# Patient Record
Sex: Female | Born: 1945
Health system: Southern US, Community
[De-identification: ages and names within clinical notes are randomized; demographics above are authoritative.]

## PROBLEM LIST (undated history)

## (undated) DIAGNOSIS — K219 Gastro-esophageal reflux disease without esophagitis: Secondary | ICD-10-CM

## (undated) DIAGNOSIS — K449 Diaphragmatic hernia without obstruction or gangrene: Secondary | ICD-10-CM

## (undated) DIAGNOSIS — I10 Essential (primary) hypertension: Secondary | ICD-10-CM

## (undated) DIAGNOSIS — K5790 Diverticulosis of intestine, part unspecified, without perforation or abscess without bleeding: Secondary | ICD-10-CM

## (undated) DIAGNOSIS — K8020 Calculus of gallbladder without cholecystitis without obstruction: Secondary | ICD-10-CM

## (undated) DIAGNOSIS — J449 Chronic obstructive pulmonary disease, unspecified: Secondary | ICD-10-CM

## (undated) DIAGNOSIS — F419 Anxiety disorder, unspecified: Secondary | ICD-10-CM

## (undated) DIAGNOSIS — R918 Other nonspecific abnormal finding of lung field: Secondary | ICD-10-CM

## (undated) DIAGNOSIS — K559 Vascular disorder of intestine, unspecified: Secondary | ICD-10-CM

## (undated) DIAGNOSIS — N200 Calculus of kidney: Secondary | ICD-10-CM

## (undated) DIAGNOSIS — M549 Dorsalgia, unspecified: Secondary | ICD-10-CM

## (undated) DIAGNOSIS — K922 Gastrointestinal hemorrhage, unspecified: Secondary | ICD-10-CM

## (undated) DIAGNOSIS — I2510 Atherosclerotic heart disease of native coronary artery without angina pectoris: Secondary | ICD-10-CM

## (undated) DIAGNOSIS — G8929 Other chronic pain: Secondary | ICD-10-CM

## (undated) HISTORY — DX: Calculus of kidney: N20.0

## (undated) HISTORY — DX: Atherosclerotic heart disease of native coronary artery without angina pectoris: I25.10

## (undated) HISTORY — PX: ABDOMINAL HYSTERECTOMY: SHX81

## (undated) HISTORY — DX: Calculus of gallbladder without cholecystitis without obstruction: K80.20

## (undated) HISTORY — PX: BACK SURGERY: SHX140

## (undated) HISTORY — PX: SPINAL FUSION: SHX223

## (undated) HISTORY — DX: Anxiety disorder, unspecified: F41.9

## (undated) HISTORY — PX: SMALL INTESTINE SURGERY: SHX150

## (undated) HISTORY — DX: Essential (primary) hypertension: I10

## (undated) HISTORY — DX: Gastro-esophageal reflux disease without esophagitis: K21.9

## (undated) HISTORY — PX: SALPINGOOPHORECTOMY: SHX82

---

## 1993-05-27 HISTORY — PX: CORONARY STENT PLACEMENT: SHX1402

## 1996-05-27 HISTORY — PX: CORONARY ARTERY BYPASS GRAFT: SHX141

## 1998-03-02 ENCOUNTER — Encounter: Payer: Self-pay | Admitting: *Deleted

## 1998-03-02 ENCOUNTER — Ambulatory Visit (HOSPITAL_COMMUNITY): Admission: RE | Admit: 1998-03-02 | Discharge: 1998-03-02 | Payer: Self-pay

## 2002-06-09 ENCOUNTER — Ambulatory Visit (HOSPITAL_COMMUNITY): Admission: RE | Admit: 2002-06-09 | Discharge: 2002-06-09 | Payer: Self-pay | Admitting: Internal Medicine

## 2002-06-09 ENCOUNTER — Encounter: Payer: Self-pay | Admitting: Internal Medicine

## 2003-06-24 ENCOUNTER — Ambulatory Visit (HOSPITAL_COMMUNITY): Admission: RE | Admit: 2003-06-24 | Discharge: 2003-06-24 | Payer: Self-pay | Admitting: Internal Medicine

## 2004-07-17 ENCOUNTER — Ambulatory Visit (HOSPITAL_COMMUNITY): Admission: RE | Admit: 2004-07-17 | Discharge: 2004-07-17 | Payer: Self-pay | Admitting: Internal Medicine

## 2005-07-30 ENCOUNTER — Ambulatory Visit (HOSPITAL_COMMUNITY): Admission: RE | Admit: 2005-07-30 | Discharge: 2005-07-30 | Payer: Self-pay | Admitting: Internal Medicine

## 2005-07-30 ENCOUNTER — Emergency Department (HOSPITAL_COMMUNITY): Admission: EM | Admit: 2005-07-30 | Discharge: 2005-07-30 | Payer: Self-pay | Admitting: Emergency Medicine

## 2005-08-27 ENCOUNTER — Ambulatory Visit (HOSPITAL_COMMUNITY): Admission: RE | Admit: 2005-08-27 | Discharge: 2005-08-27 | Payer: Self-pay | Admitting: Internal Medicine

## 2005-08-30 ENCOUNTER — Ambulatory Visit (HOSPITAL_COMMUNITY): Admission: RE | Admit: 2005-08-30 | Discharge: 2005-08-30 | Payer: Self-pay | Admitting: Internal Medicine

## 2006-08-08 ENCOUNTER — Ambulatory Visit (HOSPITAL_COMMUNITY): Admission: RE | Admit: 2006-08-08 | Discharge: 2006-08-08 | Payer: Self-pay | Admitting: Internal Medicine

## 2009-05-31 ENCOUNTER — Inpatient Hospital Stay (HOSPITAL_COMMUNITY): Admission: EM | Admit: 2009-05-31 | Discharge: 2009-06-08 | Payer: Self-pay | Admitting: Emergency Medicine

## 2009-09-18 ENCOUNTER — Ambulatory Visit (HOSPITAL_COMMUNITY): Admission: RE | Admit: 2009-09-18 | Discharge: 2009-09-18 | Payer: Self-pay | Admitting: Internal Medicine

## 2010-06-17 ENCOUNTER — Encounter: Payer: Self-pay | Admitting: Internal Medicine

## 2010-08-12 LAB — BLOOD GAS, ARTERIAL
Bicarbonate: 22.5 mEq/L (ref 20.0–24.0)
O2 Saturation: 95.9 %
pCO2 arterial: 35.6 mmHg (ref 35.0–45.0)
pH, Arterial: 7.418 — ABNORMAL HIGH (ref 7.350–7.400)

## 2010-08-12 LAB — DIFFERENTIAL
Basophils Absolute: 0 10*3/uL (ref 0.0–0.1)
Basophils Absolute: 0 10*3/uL (ref 0.0–0.1)
Basophils Absolute: 0 10*3/uL (ref 0.0–0.1)
Basophils Relative: 0 % (ref 0–1)
Basophils Relative: 0 % (ref 0–1)
Basophils Relative: 1 % (ref 0–1)
Eosinophils Absolute: 0 10*3/uL (ref 0.0–0.7)
Eosinophils Absolute: 0 10*3/uL (ref 0.0–0.7)
Eosinophils Absolute: 0.1 10*3/uL (ref 0.0–0.7)
Eosinophils Relative: 0 % (ref 0–5)
Eosinophils Relative: 0 % (ref 0–5)
Eosinophils Relative: 2 % (ref 0–5)
Lymphocytes Relative: 36 % (ref 12–46)
Lymphocytes Relative: 40 % (ref 12–46)
Lymphocytes Relative: 7 % — ABNORMAL LOW (ref 12–46)
Lymphs Abs: 1.4 10*3/uL (ref 0.7–4.0)
Lymphs Abs: 2.3 10*3/uL (ref 0.7–4.0)
Monocytes Absolute: 0.5 10*3/uL (ref 0.1–1.0)
Monocytes Absolute: 0.7 10*3/uL (ref 0.1–1.0)
Monocytes Relative: 4 % (ref 3–12)
Monocytes Relative: 6 % (ref 3–12)
Monocytes Relative: 9 % (ref 3–12)
Neutro Abs: 17.8 10*3/uL — ABNORMAL HIGH (ref 1.7–7.7)
Neutro Abs: 2.7 10*3/uL (ref 1.7–7.7)
Neutro Abs: 5.5 10*3/uL (ref 1.7–7.7)
Neutrophils Relative %: 48 % (ref 43–77)
Neutrophils Relative %: 57 % (ref 43–77)
Neutrophils Relative %: 90 % — ABNORMAL HIGH (ref 43–77)

## 2010-08-12 LAB — BASIC METABOLIC PANEL WITH GFR
BUN: 12 mg/dL (ref 6–23)
CO2: 30 meq/L (ref 19–32)
Calcium: 9.2 mg/dL (ref 8.4–10.5)
Chloride: 104 meq/L (ref 96–112)
Creatinine, Ser: 0.84 mg/dL (ref 0.4–1.2)
GFR calc non Af Amer: >60 mL/min
Glucose, Bld: 95 mg/dL (ref 70–99)
Potassium: 4.1 meq/L (ref 3.5–5.1)
Sodium: 141 meq/L (ref 135–145)

## 2010-08-12 LAB — BASIC METABOLIC PANEL
BUN: 19 mg/dL (ref 6–23)
CO2: 29 mEq/L (ref 19–32)
Calcium: 9.1 mg/dL (ref 8.4–10.5)
Chloride: 101 mEq/L (ref 96–112)
Chloride: 101 mEq/L (ref 96–112)
Creatinine, Ser: 0.82 mg/dL (ref 0.4–1.2)
GFR calc Af Amer: >60 mL/min (ref >60)
GFR calc Af Amer: >60 mL/min (ref >60)
GFR calc non Af Amer: >60 mL/min (ref >60)
Glucose, Bld: 172 mg/dL — ABNORMAL HIGH (ref 70–99)
Glucose, Bld: 94 mg/dL (ref 70–99)
Potassium: 4 mEq/L (ref 3.5–5.1)
Potassium: 4.2 mEq/L (ref 3.5–5.1)
Sodium: 139 mEq/L (ref 135–145)

## 2010-08-12 LAB — HEPATIC FUNCTION PANEL
ALT: 24 U/L (ref 0–35)
Albumin: 3.5 g/dL (ref 3.5–5.2)
Bilirubin, Direct: 0.1 mg/dL (ref 0.0–0.3)

## 2010-08-12 LAB — CBC
HCT: 41.2 % (ref 36.0–46.0)
HCT: 46.5 % — ABNORMAL HIGH (ref 36.0–46.0)
Hemoglobin: 14 g/dL (ref 12.0–15.0)
Hemoglobin: 15.9 g/dL — ABNORMAL HIGH (ref 12.0–15.0)
MCHC: 34 g/dL (ref 30.0–36.0)
MCV: 85.6 fL (ref 78.0–100.0)
Platelets: 249 10*3/uL (ref 150–400)
Platelets: 344 10*3/uL (ref 150–400)
RBC: 4.85 MIL/uL (ref 3.87–5.11)
RBC: 5.44 MIL/uL — ABNORMAL HIGH (ref 3.87–5.11)
RDW: 15.2 % (ref 11.5–15.5)
RDW: 15.6 % — ABNORMAL HIGH (ref 11.5–15.5)
WBC: 5.6 10*3/uL (ref 4.0–10.5)

## 2010-08-12 LAB — LIPID PANEL
HDL: 31 mg/dL — ABNORMAL LOW
Total CHOL/HDL Ratio: 6.1 ratio
Triglycerides: 221 mg/dL — ABNORMAL HIGH
VLDL: 44 mg/dL — ABNORMAL HIGH (ref 0–40)

## 2010-08-24 ENCOUNTER — Other Ambulatory Visit (HOSPITAL_COMMUNITY): Payer: Self-pay | Admitting: Internal Medicine

## 2010-08-24 DIAGNOSIS — Z139 Encounter for screening, unspecified: Secondary | ICD-10-CM

## 2010-09-17 ENCOUNTER — Other Ambulatory Visit (HOSPITAL_COMMUNITY): Payer: Self-pay | Admitting: Internal Medicine

## 2010-09-17 DIAGNOSIS — Z139 Encounter for screening, unspecified: Secondary | ICD-10-CM

## 2010-09-19 ENCOUNTER — Encounter (HOSPITAL_COMMUNITY): Payer: Self-pay

## 2010-09-19 ENCOUNTER — Ambulatory Visit (HOSPITAL_COMMUNITY)
Admission: RE | Admit: 2010-09-19 | Discharge: 2010-09-19 | Disposition: A | Discharge status: Home / Self Care | Payer: PRIVATE HEALTH INSURANCE | Source: Ambulatory Visit | Attending: Internal Medicine | Admitting: Internal Medicine

## 2010-09-19 DIAGNOSIS — Z139 Encounter for screening, unspecified: Secondary | ICD-10-CM

## 2010-09-19 DIAGNOSIS — Z13820 Encounter for screening for osteoporosis: Secondary | ICD-10-CM | POA: Insufficient documentation

## 2010-09-19 DIAGNOSIS — Z780 Asymptomatic menopausal state: Secondary | ICD-10-CM | POA: Insufficient documentation

## 2010-09-21 ENCOUNTER — Ambulatory Visit (HOSPITAL_COMMUNITY)
Admission: RE | Admit: 2010-09-21 | Discharge: 2010-09-21 | Disposition: A | Discharge status: Home / Self Care | Payer: PRIVATE HEALTH INSURANCE | Source: Ambulatory Visit | Attending: Internal Medicine | Admitting: Internal Medicine

## 2010-09-21 DIAGNOSIS — Z1231 Encounter for screening mammogram for malignant neoplasm of breast: Secondary | ICD-10-CM | POA: Insufficient documentation

## 2010-09-21 DIAGNOSIS — Z139 Encounter for screening, unspecified: Secondary | ICD-10-CM

## 2011-03-28 DIAGNOSIS — R918 Other nonspecific abnormal finding of lung field: Secondary | ICD-10-CM

## 2011-03-28 DIAGNOSIS — K559 Vascular disorder of intestine, unspecified: Secondary | ICD-10-CM

## 2011-03-28 DIAGNOSIS — K922 Gastrointestinal hemorrhage, unspecified: Secondary | ICD-10-CM

## 2011-03-28 HISTORY — DX: Gastrointestinal hemorrhage, unspecified: K92.2

## 2011-03-28 HISTORY — DX: Other nonspecific abnormal finding of lung field: R91.8

## 2011-03-28 HISTORY — DX: Vascular disorder of intestine, unspecified: K55.9

## 2011-04-08 ENCOUNTER — Emergency Department (HOSPITAL_COMMUNITY): Payer: Medicare (Managed Care)

## 2011-04-08 ENCOUNTER — Inpatient Hospital Stay (HOSPITAL_COMMUNITY)
Admission: EM | Admit: 2011-04-08 | Discharge: 2011-04-17 | DRG: 394 | Disposition: A | Discharge status: Home / Self Care | Payer: Medicare (Managed Care) | Attending: Internal Medicine | Admitting: Internal Medicine

## 2011-04-08 ENCOUNTER — Encounter (HOSPITAL_COMMUNITY): Payer: Self-pay | Admitting: Emergency Medicine

## 2011-04-08 DIAGNOSIS — K648 Other hemorrhoids: Secondary | ICD-10-CM | POA: Diagnosis present

## 2011-04-08 DIAGNOSIS — K8020 Calculus of gallbladder without cholecystitis without obstruction: Secondary | ICD-10-CM | POA: Diagnosis present

## 2011-04-08 DIAGNOSIS — K529 Noninfective gastroenteritis and colitis, unspecified: Secondary | ICD-10-CM

## 2011-04-08 DIAGNOSIS — E785 Hyperlipidemia, unspecified: Secondary | ICD-10-CM | POA: Diagnosis present

## 2011-04-08 DIAGNOSIS — I10 Essential (primary) hypertension: Secondary | ICD-10-CM | POA: Diagnosis present

## 2011-04-08 DIAGNOSIS — K559 Vascular disorder of intestine, unspecified: Principal | ICD-10-CM | POA: Diagnosis present

## 2011-04-08 DIAGNOSIS — Z951 Presence of aortocoronary bypass graft: Secondary | ICD-10-CM

## 2011-04-08 DIAGNOSIS — I2510 Atherosclerotic heart disease of native coronary artery without angina pectoris: Secondary | ICD-10-CM | POA: Diagnosis present

## 2011-04-08 DIAGNOSIS — K449 Diaphragmatic hernia without obstruction or gangrene: Secondary | ICD-10-CM | POA: Diagnosis present

## 2011-04-08 DIAGNOSIS — A09 Infectious gastroenteritis and colitis, unspecified: Secondary | ICD-10-CM | POA: Diagnosis present

## 2011-04-08 DIAGNOSIS — R911 Solitary pulmonary nodule: Secondary | ICD-10-CM | POA: Diagnosis present

## 2011-04-08 DIAGNOSIS — K5730 Diverticulosis of large intestine without perforation or abscess without bleeding: Secondary | ICD-10-CM | POA: Diagnosis present

## 2011-04-08 HISTORY — DX: Vascular disorder of intestine, unspecified: K55.9

## 2011-04-08 HISTORY — DX: Gastrointestinal hemorrhage, unspecified: K92.2

## 2011-04-08 LAB — COMPREHENSIVE METABOLIC PANEL
Alkaline Phosphatase: 76 U/L (ref 39–117)
BUN: 15 mg/dL (ref 6–23)
CO2: 29 mEq/L (ref 19–32)
Chloride: 102 mEq/L (ref 96–112)
Creatinine, Ser: 0.92 mg/dL (ref 0.50–1.10)
GFR calc non Af Amer: 63 mL/min — ABNORMAL LOW (ref >90)
Total Bilirubin: 0.4 mg/dL (ref 0.3–1.2)

## 2011-04-08 LAB — DIFFERENTIAL
Lymphocytes Relative: 11 % — ABNORMAL LOW (ref 12–46)
Lymphs Abs: 2.3 10*3/uL (ref 0.7–4.0)
Monocytes Absolute: 1.4 10*3/uL — ABNORMAL HIGH (ref 0.1–1.0)
Monocytes Relative: 7 % (ref 3–12)
Neutro Abs: 16.3 10*3/uL — ABNORMAL HIGH (ref 1.7–7.7)

## 2011-04-08 LAB — CBC
HCT: 51 % — ABNORMAL HIGH (ref 36.0–46.0)
Hemoglobin: 17.1 g/dL — ABNORMAL HIGH (ref 12.0–15.0)
MCHC: 33.5 g/dL (ref 30.0–36.0)
RBC: 5.96 MIL/uL — ABNORMAL HIGH (ref 3.87–5.11)
WBC: 20 10*3/uL — ABNORMAL HIGH (ref 4.0–10.5)

## 2011-04-08 MED ORDER — ONDANSETRON HCL 4 MG PO TABS: 4.0000 mg | ORAL_TABLET | Freq: Four times a day (QID) | ORAL | Status: DC | PRN

## 2011-04-08 MED ORDER — CIPROFLOXACIN IN D5W 400 MG/200ML IV SOLN
400.0000 mg | Freq: Once | INTRAVENOUS | Status: AC
  Administered 2011-04-08: 400 mg via INTRAVENOUS
  Filled 2011-04-08: qty 200

## 2011-04-08 MED ORDER — ACETAMINOPHEN 650 MG RE SUPP: 650.0000 mg | Freq: Four times a day (QID) | RECTAL | Status: DC | PRN

## 2011-04-08 MED ORDER — HYDROCORTISONE SOD SUCCINATE 100 MG IJ SOLR
100.0000 mg | Freq: Once | INTRAMUSCULAR | Status: DC
Start: 2011-04-08 — End: 2011-04-08
  Filled 2011-04-08: qty 2

## 2011-04-08 MED ORDER — SIMVASTATIN 20 MG PO TABS
20.0000 mg | ORAL_TABLET | Freq: Every day | ORAL | Status: DC
  Administered 2011-04-08 – 2011-04-16 (×9): 20 mg via ORAL
  Filled 2011-04-08 (×11): qty 1

## 2011-04-08 MED ORDER — SODIUM CHLORIDE 0.9 % IV SOLN
INTRAVENOUS | Status: DC
  Administered 2011-04-09 (×2): via INTRAVENOUS
  Administered 2011-04-10 – 2011-04-11 (×3): 1000 mL via INTRAVENOUS
  Administered 2011-04-12 – 2011-04-16 (×5): via INTRAVENOUS
  Administered 2011-04-16: 1000 mL via INTRAVENOUS

## 2011-04-08 MED ORDER — HYDROCORTISONE SOD SUCCINATE 250 MG IJ SOLR
200.0000 mg | Freq: Once | INTRAMUSCULAR | Status: AC
  Administered 2011-04-08: 200 mg via INTRAVENOUS
  Filled 2011-04-08: qty 200

## 2011-04-08 MED ORDER — ALUM & MAG HYDROXIDE-SIMETH 200-200-20 MG/5ML PO SUSP: 30.0000 mL | Freq: Four times a day (QID) | ORAL | Status: DC | PRN

## 2011-04-08 MED ORDER — ONDANSETRON HCL 4 MG/2ML IJ SOLN
4.0000 mg | Freq: Four times a day (QID) | INTRAMUSCULAR | Status: DC | PRN
  Administered 2011-04-10: 4 mg via INTRAVENOUS
  Filled 2011-04-08: qty 2

## 2011-04-08 MED ORDER — ONDANSETRON HCL 4 MG/2ML IJ SOLN
4.0000 mg | Freq: Once | INTRAMUSCULAR | Status: AC
  Administered 2011-04-08: 4 mg via INTRAVENOUS
  Filled 2011-04-08: qty 2

## 2011-04-08 MED ORDER — DIPHENHYDRAMINE HCL 50 MG/ML IJ SOLN
50.0000 mg | Freq: Once | INTRAMUSCULAR | Status: AC
  Administered 2011-04-08: 50 mg via INTRAVENOUS
  Filled 2011-04-08 (×2): qty 1

## 2011-04-08 MED ORDER — PANTOPRAZOLE SODIUM 40 MG IV SOLR
40.0000 mg | INTRAVENOUS | Status: DC
  Administered 2011-04-08 – 2011-04-11 (×4): 40 mg via INTRAVENOUS
  Filled 2011-04-08 (×4): qty 40

## 2011-04-08 MED ORDER — HYDROMORPHONE HCL PF 1 MG/ML IJ SOLN
0.5000 mg | INTRAMUSCULAR | Status: DC | PRN
  Administered 2011-04-08 – 2011-04-11 (×7): 0.5 mg via INTRAVENOUS
  Filled 2011-04-08 (×7): qty 1

## 2011-04-08 MED ORDER — METOPROLOL TARTRATE 50 MG PO TABS
ORAL_TABLET | ORAL | Status: AC
  Administered 2011-04-08: 20:00:00
  Filled 2011-04-08: qty 1

## 2011-04-08 MED ORDER — METOPROLOL SUCCINATE ER 50 MG PO TB24
25.0000 mg | ORAL_TABLET | Freq: Every day | ORAL | Status: DC
  Administered 2011-04-08 – 2011-04-13 (×6): 25 mg via ORAL
  Filled 2011-04-08: qty 1
  Filled 2011-04-08 (×2): qty 0.5
  Filled 2011-04-08: qty 1
  Filled 2011-04-08: qty 0.5
  Filled 2011-04-08 (×3): qty 1

## 2011-04-08 MED ORDER — SODIUM CHLORIDE 0.9 % IV SOLN
Freq: Once | INTRAVENOUS | Status: AC
  Administered 2011-04-08: 16:00:00 via INTRAVENOUS

## 2011-04-08 MED ORDER — IOHEXOL 300 MG/ML  SOLN
100.0000 mL | Freq: Once | INTRAMUSCULAR | Status: AC | PRN
  Administered 2011-04-08: 100 mL via INTRAVENOUS

## 2011-04-08 MED ORDER — HYDROMORPHONE HCL PF 1 MG/ML IJ SOLN
1.0000 mg | Freq: Once | INTRAMUSCULAR | Status: AC
  Administered 2011-04-08: 1 mg via INTRAVENOUS
  Filled 2011-04-08: qty 1

## 2011-04-08 MED ORDER — ACETAMINOPHEN 325 MG PO TABS
650.0000 mg | ORAL_TABLET | Freq: Four times a day (QID) | ORAL | Status: DC | PRN
  Administered 2011-04-12: 650 mg via ORAL
  Filled 2011-04-08: qty 2

## 2011-04-08 NOTE — ED Notes (Signed)
Patient report given to this nurse. Assuming care of patient.  

## 2011-04-08 NOTE — ED Provider Notes (Signed)
History   This chart was scribed for Valerie Lennert, MD by Clarita Crane. The patient was seen in room APA04/[facility]04 and the patient's care was started at 3:12PM.   CSN: [id number] Arrival date & time: 04/08/2011  2:02 PM   First MD Initiated Contact with Patient 04/08/11 1509      Chief Complaint  Patient presents with  . Nausea  . Emesis  . Rectal Bleeding   HPI [name] is a 65 y.o. female who presents to the Emergency Department complaining of constant moderate to severe diarrhea with associated rectal bleeding, nausea, vomiting and lower abdominal pain described as burning onset last night and persistent since. Patient states rectal bleeding began following several episodes of diarrhea and has been mostly bright red blood from rectum with a small amount of stool. Denies hematemesis, fever, SOB. States she had experienced similar symptoms approximately 1 month ago which resolved on its own prior to being evaluated by PCP[facility]. Patient with h/o partial hysterectomy and hypertension.  PCP-[facility]  Past Medical History  Diagnosis Date  . Hypertension     History reviewed. No pertinent past surgical history.  History reviewed. No pertinent family history.  History  Substance Use Topics  . Smoking status: Not on file  . Smokeless tobacco: Not on file  . Alcohol Use: Yes    OB History    Grav Para Term Preterm Abortions TAB SAB Ect Mult Living                  Review of Systems  Constitutional: Negative for fever.  HENT: Negative for congestion and rhinorrhea.   Eyes: Negative for pain.  Respiratory: Negative for cough and shortness of breath.   Cardiovascular: Negative for chest pain.  Gastrointestinal: Positive for nausea, vomiting, abdominal pain, diarrhea and anal bleeding.  Genitourinary: Negative for dysuria.  Musculoskeletal: Negative for back pain.  Skin: Negative for rash.  Neurological: Negative for weakness and headaches.     Allergies  Omnipaque and  Penicillins  Home Medications   Current Outpatient Rx  Name Route Sig Dispense Refill  . ASPIRIN EC 81 MG PO TBEC Oral Take 81 mg by mouth daily.      Marland Kitchen VITAMIN D 1000 UNITS PO TABS Oral Take 1,000 Units by mouth daily.      Marland Kitchen LISINOPRIL 10 MG PO TABS Oral Take 10 mg by mouth daily.      Marland Kitchen METOPROLOL SUCCINATE 25 MG PO TB24 Oral Take 25 mg by mouth daily.      Marland Kitchen SIMVASTATIN 20 MG PO TABS Oral Take 20 mg by mouth at bedtime.        BP 129/104  Pulse 64  Temp(Src) 98.3 F (36.8 C) (Oral)  Resp 20  Ht 5\' 3"  (1.6 m)  Wt 200 lb (90.719 kg)  BMI 35.43 kg/m2  SpO2 96%  Physical Exam  Nursing note and vitals reviewed. Constitutional: She is oriented to person, place, and time. She appears well-developed and well-nourished. No distress.  HENT:  Head: Normocephalic and atraumatic.  Eyes: EOM are normal. Pupils are equal, round, and reactive to light. No scleral icterus.  Neck: Neck supple. No thyromegaly present.  Cardiovascular: Normal rate and regular rhythm.  Exam reveals no gallop and no friction rub.   No murmur heard. Pulmonary/Chest: Effort normal. No stridor. No respiratory distress. She has no wheezes. She has no rales. She exhibits no tenderness.  Abdominal: Soft. She exhibits no distension. There is tenderness (moderate) in the right  lower quadrant, suprapubic area and left lower quadrant. There is no rebound and no guarding.  Musculoskeletal: Normal range of motion. She exhibits no edema.  Neurological: She is alert and oriented to person, place, and time.  Skin: Skin is warm and dry.  Psychiatric: She has a normal mood and affect. Her behavior is normal.    ED Course  Procedures  DIAGNOSTIC STUDIES: Oxygen Saturation is 98% on room air, normal by my interpretation.    COORDINATION OF CARE: 5:24PM- Patient with bright red blood observed in commode after BM. 6:20PM- Patient informed of current lab and imaging results and intent to admit to hospital. Patient agrees  with current plan set forth. 6:32PM- Consult complete with Dr. Ouida Sills. Patient case explained and discussed. Dr. Ouida Sills agrees to admit patient for further evaluation and treatment.  Labs Reviewed  CBC - Abnormal; Notable for the following:    WBC 20.0 (*)    RBC 5.96 (*)    Hemoglobin 17.1 (*)    HCT 51.0 (*)    Platelets 416 (*)    All other components within normal limits  DIFFERENTIAL - Abnormal; Notable for the following:    Neutrophils Relative 82 (*)    Neutro Abs 16.3 (*)    Lymphocytes Relative 11 (*)    Monocytes Absolute 1.4 (*)    All other components within normal limits  COMPREHENSIVE METABOLIC PANEL - Abnormal; Notable for the following:    Glucose, Bld 127 (*)    Total Protein 8.4 (*)    GFR calc non Af Amer 63 (*)    GFR calc Af Amer 73 (*)    All other components within normal limits   Ct Abdomen Pelvis W Contrast  04/08/2011  *RADIOLOGY REPORT*  Clinical Data: Rectal bleeding.  Nausea and vomiting.  Previous partial hysterectomy.  CT ABDOMEN AND PELVIS WITH CONTRAST  Technique:  Multidetector CT imaging of the abdomen and pelvis was performed following the standard protocol during bolus administration of intravenous contrast.  Contrast: OMNIPAQUE IOHEXOL 300 MG/ML IV SOLN  Comparison: None.  Findings: Moderate sized hiatal hernia.  Diffuse low density wall thickening involving the distal transverse colon, splenic flexure, descending colon and proximal sigmoid colon.  Associated pericolonic soft tissue stranding.  Multiple sigmoid colon diverticula.  Normal appearing appendix.  No enlarged lymph nodes.  1.5 cm noncalcified gallstone in the gallbladder without gallbladder wall thickening or pericholecystic fluid.  Unremarkable liver, spleen, pancreas, adrenal glands, kidneys and urinary bladder.  Small uterus containing calcified and noncalcified masses.  Unremarkable right ovary.  The left ovary is not visualized.  4 mm subpleural nodule in the lateral aspect of the  right middle lobe on image number 30.  Atheromatous arterial calcifications. Normal opacification of the superior mesenteric artery and vein and their branches.  Lumbar and lower thoracic spine degenerative changes.  IMPRESSION:  1.  Transverse, descending and proximal sigmoid colon colitis. This is most likely infectious or inflammatory in nature.  Ischemic colitis is less likely. 2.  Sigmoid diverticulosis. 3.  Cholelithiasis without evidence of cholecystitis. 4.  Moderate-sized hiatal hernia. 5.  Small uterus containing fibroids. 6.  4 mm subpleural right middle lobe noncalcified nodule.  An elective chest CT with contrast would be useful for establishing a baseline for follow up.  Original Report Authenticated By: Darrol Angel, M.D.     1. Colitis       MDM        The chart was scribed for me under my  direct supervision.  I personally performed the history, physical, and medical decision making and all procedures in the evaluation of this patient.Valerie Lennert, MD 04/08/11 [phone]

## 2011-04-08 NOTE — ED Notes (Signed)
Pt c/o n/v/d with rectal bleed since last night. Pt also c/o headache.

## 2011-04-08 NOTE — ED Notes (Signed)
Small stool specimen obtained. Bloody. Placed in specimen cup to be sent to lab.

## 2011-04-08 NOTE — ED Notes (Signed)
C/o n/v/d and lower abd cramping onset last night.

## 2011-04-08 NOTE — ED Notes (Signed)
Dr. Ouida Sills paged for Dr. Estell Harpin.

## 2011-04-08 NOTE — ED Notes (Signed)
Given ginger ale per request. Denies any needs. Pain 3/10 at this time. Call bell within reach. Family at bedside. In no distress. Will continue to monitor.

## 2011-04-08 NOTE — ED Notes (Signed)
Report given to [place] hurd, rn. Ready for patient transfer.

## 2011-04-08 NOTE — ED Notes (Signed)
ATTEMPTED TO CALL REPORT. WILL CALL BACK FOR REPORT.

## 2011-04-08 NOTE — ED Notes (Signed)
Pt also reports passing blood in stool; reports diarrhea stools with no blood initially, but states, "now it's nothing but blood".  Also c/o right anterior rib pain onset after vomiting last night.

## 2011-04-08 NOTE — ED Notes (Signed)
Patient resting sitting up in bed. Denies any needs at this time. Call bell within reach. Family at bedside. Notified awaiting bed assignment. Call bell within reach. Will continue to monitor.

## 2011-04-08 NOTE — ED Notes (Signed)
Into room to see patient. Resting laying on back in bed. cipro infusion complete. Normal saline continues to infuse well. States she needs to pass bowel movement. Tech into room to place patient on bedpan. Denies any needs at this time. Call bell within reach. Family with patient.

## 2011-04-09 ENCOUNTER — Encounter (HOSPITAL_COMMUNITY): Payer: Self-pay | Admitting: Urgent Care

## 2011-04-09 DIAGNOSIS — K5289 Other specified noninfective gastroenteritis and colitis: Secondary | ICD-10-CM

## 2011-04-09 DIAGNOSIS — K8050 Calculus of bile duct without cholangitis or cholecystitis without obstruction: Secondary | ICD-10-CM

## 2011-04-09 LAB — CBC
MCH: 28.5 pg (ref 26.0–34.0)
Platelets: 316 10*3/uL (ref 150–400)
RBC: 4.85 MIL/uL (ref 3.87–5.11)
RDW: 15.2 % (ref 11.5–15.5)
WBC: 16.1 10*3/uL — ABNORMAL HIGH (ref 4.0–10.5)

## 2011-04-09 MED ORDER — CIPROFLOXACIN IN D5W 400 MG/200ML IV SOLN
400.0000 mg | Freq: Two times a day (BID) | INTRAVENOUS | Status: AC
  Administered 2011-04-09 – 2011-04-11 (×6): 400 mg via INTRAVENOUS
  Filled 2011-04-09 (×7): qty 200

## 2011-04-09 NOTE — Progress Notes (Signed)
NAMEKENNA, [name]                   ACCOUNT NO.:  [id number]  MEDICAL RECORD NO.:  [id number]  LOCATION:  A334                          FACILITY:  [facility]  PHYSICIAN:  Gildardo Tickner D. Felecia Shelling, MD   DATE OF BIRTH:  07-24-1945  DATE OF PROCEDURE:  04/09/2011 DATE OF DISCHARGE:                                PROGRESS NOTE   SUBJECTIVE:  The patient continued to complain of lower abdominal pain and rectal bleeding.  She was admitted yesterday and being treated as possible infectious colitis.  However, the patient has a history of ischemic colitis in the past.  PHYSICAL EXAMINATION:  VITAL SIGNS:  Blood pressure 124/106, pulse 64, respiratory rate 18, temperature 97.7 degrees Fahrenheit. CHEST:  Decreased air entry, few rhonchi. CARDIOVASCULAR SYSTEM:  First and second heart sounds heard.  No murmur. No gallop. ABDOMEN:  Soft and lax.  Bowel sound is positive. EXTREMITIES:  There is mild tenderness in the lower extremities.  No leg edema.  LABORATORY DATA:  CBC:  WBC 16.1, hemoglobin 13.8, hematocrit 41.6, and platelet 316.  BMP:  Sodium 142, potassium 3.5, chloride 102, carbon dioxide 29, glucose 127, BUN 15, creatinine 0.9, chloride 105, and calcium 8.4.  Albumin 4.5, AST 14, and ALT 76.  ASSESSMENT: 1. Abdominal pain, nausea, vomiting with rectal bleeding, probably     recurrent ischemic colitis, possibility of also infectious colitis     is being considered. 2. Hypertension. 3. Hyperlipidemia. 4. Coronary artery disease.  PLAN:  We will continue the patient on IV fluids.  Continue pain management.  Continue IV antibiotics.  We will do GI consult.  We will continue to monitor CBC and BMP.     Janani Chamber D. Felecia Shelling, MD     TDF/MEDQ  D:  04/09/2011  T:  04/09/2011  Job:  [id number]

## 2011-04-09 NOTE — H&P (Signed)
[name], [name]                   ACCOUNT NO.:  [id number]  MEDICAL RECORD NO.:  [id number]  LOCATION:  A334                          FACILITY:  [facility]  PHYSICIAN:  Kingsley Callander. Ouida Sills, MD       DATE OF BIRTH:  May 09, 1946  DATE OF ADMISSION:  04/08/2011 DATE OF DISCHARGE:  LH                             HISTORY & PHYSICAL   CHIEF COMPLAINT:  Rectal bleeding.  HISTORY OF PRESENT ILLNESS:  This patient is a 65 year old African American female who presented to the emergency room with a 2-day history of rectal bleeding.  She had initially experienced nausea and vomiting as well as mid abdominal pain.  She then developed rectal bleeding with burgundy-colored stools.  She states that she also saw some blood in her stool, approximately 2 weeks ago though.  She has remained hemodynamically stable in the emergency room.  She has a history of vascular disease, having had coronary artery bypass surgery.  She states she has undergone a colonoscopy in approximately 1998.  She receives anticoagulation only in the form of an 81 mg aspirin daily.  She denies having vomited any blood.  PAST MEDICAL HISTORY: 1. Coronary artery disease status post bypass surgery. 2. Hypertension. 3. Hyperlipidemia. 4. Status post hysterectomy and left oophorectomy.  MEDICATIONS: 1. Toprol-XL 25 mg daily. 2. Simvastatin 20 mg daily. 3. Aspirin 81 mg daily. 4. Vitamin D 1000 units daily. 5. Lisinopril 10 mg daily.  ALLERGIES: 1. PENICILLIN. 2. OMNIPAQUE.  FAMILY HISTORY:  Her mother had stomach cancer.  Her father had a cerebral aneurysm.  SOCIAL HISTORY:  She has not smoked for 8 years.  She does not use alcohol or drugs.  REVIEW OF SYSTEMS:  Noncontributory.  PHYSICAL EXAMINATION:  VITAL SIGNS:  Temperature 98.1, pulse 92, respirations 18, blood pressure 152/83. GENERAL:  Alert and oriented. HEENT:  Eyes, nose, and oropharynx are unremarkable. NECK:  No JVD or thyromegaly. LUNGS:  Clear. HEART:  Regular  with no murmurs. ABDOMEN:  Mildly tender in the mid abdomen with no palpable mass or hepatosplenomegaly. EXTREMITIES:  No cyanosis, clubbing, or edema. NEURO:  No focal deficits. LYMPH NODES:  No cervical or supraclavicular enlargement.  LABORATORY DATA:  White count 20,000, hemoglobin 17.1, platelets 416,000.  Sodium 142, potassium 3.5, bicarb 29, BUN 15, creatinine 1.92, and calcium 10.5.  CT of the abdomen and pelvis reveals transverse, descending, and proximal sigmoid colon colitis.  This was felt to most likely be related to infectious or inflammatory conditions with ischemic colitis being felt less likely.  She also had evidence of sigmoid diverticulosis, cholelithiasis, moderate-sized hiatal hernia, and a small uterus containing fibroids (even though she has told me she has had a hysterectomy).  She also had a 4-mm subpleural right middle lobe noncalcified nodule with Radiology recommending an elective chest CT with contrast for establishing a baseline for follow up.  IMPRESSION/PLAN: 1. Colitis, etiology unclear.  Stool culture will be obtained.  Her     case will be discussed with Dr. Felecia Shelling in the morning regarding     further evaluation by Gastroenterology.  Aspirin will be held.  She     will be treated  with IV fluids and antiemetics.  She was given a     dose of Cipro by the ER staff prior to my arrival. 2. Coronary artery disease, stable. 3. Hypertension. 4. Hyperlipidemia. 5. Right middle lobe lung nodule. 6. Cholelithiasis. 7. Hiatal hernia.     Kingsley Callander. Ouida Sills, MD     ROF/MEDQ  D:  04/09/2011  T:  04/09/2011  Job:  [id number]

## 2011-04-09 NOTE — Consult Note (Signed)
Referring Provider: Dr. Felecia Shelling Primary Care Physician:  Avon Gully, MD Primary Gastroenterologist:  Dr. Jena Gauss  Reason for Consultation:  Rectal  Bleeding & Abdominal Pain  Chief Complaint  Patient presents with  . Nausea  . Emesis  . Rectal Bleeding    HPI:  [name] is a 64 y.o. female admitted by Dr. Felecia Shelling for rectal bleeding & abdominal pain.  She tells me she started vomiting at 11:30pm to 3:00 am 2 nights ago.  Had constant vomiting, then diarrhea TNTC.  Saw large amounts burgundy blood with the stool in the commode & on the toilet paper.  She had consumed a large amount of turnip greens, BBQ ribs, mac&cheese, cornbread & deviled eggs just prior to the onset of her pain.  C/o nausea, but no vomiting since yesterday.  Takes asa 81mg  daily.  Took 2 "cold pills" from Dr Letitia Neri office -thinks they may have been antibiotics finished 4 days ago.  Normally 1 BM daily.    Rare heartburn & indigestion before this episode.  She c/o cramp-like pain across lower abd 8/10 at worse, now 0/10 w/ pain meds.  Wt stable.  Appetite ok.  +Well water.  No foreign travel.     Past Medical History  Diagnosis Date  . Hypertension   . GI bleed Nov 2012  . Colitis, ischemic     yrs ago  . S/P colonoscopy     over 10 yrs ago-normal  . Kidney stone    Past Surgical History  Procedure Date  . Spinal fusion     x3  . Salpingoophorectomy     left  . Coronary stent placement 1995  . Coronary artery bypass graft 1998   Current Facility-Administered Medications  Medication Dose Route Frequency Provider Last Rate Last Dose  . 0.9 %  sodium chloride infusion   Intravenous Once Benny Lennert, MD      . 0.9 %  sodium chloride infusion   Intravenous Continuous Roy Fagan 75 mL/hr at 04/09/11 0412    . acetaminophen (TYLENOL) tablet 650 mg  650 mg Oral Q6H PRN Carylon Perches       Or  . acetaminophen (TYLENOL) suppository 650 mg  650 mg Rectal Q6H PRN Carylon Perches      . alum & mag hydroxide-simeth  (MAALOX/MYLANTA) 200-200-20 MG/5ML suspension 30 mL  30 mL Oral Q6H PRN Carylon Perches      . ciprofloxacin (CIPRO) IVPB 400 mg  400 mg Intravenous Once Benny Lennert, MD   400 mg at 04/08/11 1805  . diphenhydrAMINE (BENADRYL) injection 50 mg  50 mg Intravenous Once Benny Lennert, MD   50 mg at 04/08/11 1548  . hydrocortisone sodium succinate (SOLU-CORTEF) injection 200 mg  200 mg Intravenous Once Benny Lennert, MD   200 mg at 04/08/11 1607  . HYDROmorphone (DILAUDID) injection 0.5 mg  0.5 mg Intravenous Q3H PRN Roy Fagan   0.5 mg at 04/09/11 0759  . HYDROmorphone (DILAUDID) injection 1 mg  1 mg Intravenous Once Benny Lennert, MD   1 mg at 04/08/11 1548  . iohexol (OMNIPAQUE) 300 MG/ML injection 100 mL  100 mL Intravenous Once PRN Roy Fagan   100 mL at 04/08/11 1742  . metoprolol (LOPRESSOR) 50 MG tablet           . metoprolol (TOPROL-XL) 24 hr tablet 25 mg  25 mg Oral Daily Roy Fagan   25 mg at 04/08/11 1930  . ondansetron (ZOFRAN) injection 4 mg  4 mg Intravenous Once Benny Lennert, MD   4 mg at 04/08/11 1548  . ondansetron (ZOFRAN) tablet 4 mg  4 mg Oral Q6H PRN Carylon Perches       Or  . ondansetron [facility]) injection 4 mg  4 mg Intravenous Q6H PRN Carylon Perches      . pantoprazole (PROTONIX) injection 40 mg  40 mg Intravenous Q24H Roy Fagan   40 mg at 04/08/11 2250  . simvastatin (ZOCOR) tablet 20 mg  20 mg Oral QHS Roy Fagan   20 mg at 04/08/11 2250  . DISCONTD: hydrocortisone sodium succinate (SOLU-CORTEF) injection 100 mg  100 mg Intravenous Once Benny Lennert, MD       Allergies as of 04/08/2011 - Review Complete 04/08/2011  Allergen Reaction Noted  . Omnipaque (iohexol)  09/19/2010  . Penicillins  09/19/2010   Family History  Problem Relation Age of Onset  . Stomach cancer Mother 70    ? believes it was stomach  . Bone cancer Sister 63  . Stomach cancer Sister 58   History   Social History  . Marital Status: Divorced    Spouse Name: N/A    Number of Children: 0  . Years of  Education: N/A   Occupational History  . disabled    Social History Main Topics  . Smoking status: Former Smoker -- 1.0 packs/day for 40 years    Types: Cigarettes    Quit date: 09/14/2010  . Smokeless tobacco: Not on file  . Alcohol Use: Yes     couple drinks/yr  . Drug Use: Yes     marijuana (QUIT couple yrs ago)  . Sexually Active: Not on file   Other Topics Concern  . Not on file   Social History Narrative   Lives alone  Review of Systems: Gen: +weakness, fatigue, malaise.  Denies any fever, chills, sweats, anorexia, weight loss, and sleep disorder CV: Denies chest pain, angina, palpitations, syncope, orthopnea, PND, peripheral edema, and claudication. Resp: Denies dyspnea at rest, dyspnea with exercise, cough, sputum, wheezing, coughing up blood, and pleurisy. GI: Denies vomiting blood, jaundice, and fecal incontinence.   Denies dysphagia or odynophagia. GU : Denies urinary burning, blood in urine, urinary frequency, urinary hesitancy, nocturnal urination, and urinary incontinence. MS: Denies joint pain, limitation of movement, and swelling, stiffness, low back pain, extremity pain. Denies muscle weakness, cramps, atrophy.  Derm: Denies rash, itching, dry skin, hives, moles, warts, or unhealing ulcers.  Psych: Denies depression, anxiety, memory loss, suicidal ideation, hallucinations, paranoia, and confusion. Heme: Denies bruising, bleeding, and enlarged lymph nodes. Neuro:  Denies any headaches, dizziness, paresthesias. Endo:  Denies any problems with DM, thyroid, adrenal function.  Physical Exam: Vital signs in last 24 hours: Temp:  [97.7 F (36.5 C)-98.4 F (36.9 C)] 98.1 F (36.7 C) (11/13 0649) Pulse Rate:  [64-92] 92  (11/13 0649) Resp:  [18-20] 18  (11/13 0649) BP: (124-193)/(83-106) 152/83 mmHg (11/13 0649) SpO2:  [92 %-98 %] 92 % (11/13 0649) Weight:  [195 lb 12.3 oz (88.8 kg)-200 lb (90.719 kg)] 197 lb 3.2 oz (89.449 kg) (11/13 0500) Last BM Date:  04/09/11 General:   Alert,  Well-developed, well-nourished, pleasant and cooperative in NAD Head:  Normocephalic and atraumatic. Eyes:  Sclera clear, no icterus.   Conjunctiva pink. Ears:  Normal auditory acuity. Nose:  No deformity, discharge,  or lesions. Mouth:  No deformity or lesions.  Oropharynx pink & moist. Neck:  Supple; no masses or thyromegaly. Lungs:  Clear throughout  to auscultation.   No wheezes, crackles, or rhonchi. No acute distress. Heart:  Regular rate and rhythm; no murmurs, clicks, rubs,  or gallops. Abdomen:  Soft, nontender and nondistended. No masses, hepatosplenomegaly or hernias noted. Normal bowel sounds, without guarding, and without rebound.   Rectal:  Deferred until time of colonoscopy.   Msk:  Symmetrical without gross deformities. Normal posture. Pulses:  Normal pulses noted. Extremities:  Without clubbing or edema. Neurologic:  Alert and  oriented x4;  grossly normal neurologically. Skin:  Intact without significant lesions or rashes. Cervical Nodes:  No significant cervical adenopathy. Psych:  Alert and cooperative. Normal mood and affect.  Lab Results:  [facility] 04/09/11 0535 04/08/11 1519  WBC 16.1* 20.0*  HGB 13.8 17.1*  HCT 41.6 51.0*  PLT 316 416*   BMET  Basename 04/08/11 1519  NA 142  K 3.5  CL 102  CO2 29  GLUCOSE 127*  BUN 15  CREATININE 0.92  CALCIUM 10.5   LFT  Basename 04/08/11 1519  PROT 8.4*  ALBUMIN 4.5  AST 19  ALT 14  ALKPHOS 76  BILITOT 0.4  BILIDIR --  IBILI --  LIPASE --  AMYLASE --   Studies/Results: Ct Abdomen Pelvis W Contrast  04/08/2011  *RADIOLOGY REPORT*  Clinical Data: Rectal bleeding.  Nausea and vomiting.  Previous partial hysterectomy.  CT ABDOMEN AND PELVIS WITH CONTRAST  Technique:  Multidetector CT imaging of the abdomen and pelvis was performed following the standard protocol during bolus administration of intravenous contrast.  Contrast: OMNIPAQUE IOHEXOL 300 MG/ML IV SOLN  Comparison:  None.  Findings: Moderate sized hiatal hernia.  Diffuse low density wall thickening involving the distal transverse colon, splenic flexure, descending colon and proximal sigmoid colon.  Associated pericolonic soft tissue stranding.  Multiple sigmoid colon diverticula.  Normal appearing appendix.  No enlarged lymph nodes.  1.5 cm noncalcified gallstone in the gallbladder without gallbladder wall thickening or pericholecystic fluid.  Unremarkable liver, spleen, pancreas, adrenal glands, kidneys and urinary bladder.  Small uterus containing calcified and noncalcified masses.  Unremarkable right ovary.  The left ovary is not visualized.  4 mm subpleural nodule in the lateral aspect of the right middle lobe on image number 30.  Atheromatous arterial calcifications. Normal opacification of the superior mesenteric artery and vein and their branches.  Lumbar and lower thoracic spine degenerative changes.  IMPRESSION:  1.  Transverse, descending and proximal sigmoid colon colitis. This is most likely infectious or inflammatory in nature.  Ischemic colitis is less likely. 2.  Sigmoid diverticulosis. 3.  Cholelithiasis without evidence of cholecystitis. 4.  Moderate-sized hiatal hernia. 5.  Small uterus containing fibroids. 6.  4 mm subpleural right middle lobe noncalcified nodule.  An elective chest CT with contrast would be useful for establishing a baseline for follow up.  Original Report Authenticated By: Darrol Angel, M.D.   Impression: NIKESHIA KEETCH is a 65 y.o. female w/ acute onset abdominal pain, nausea, vomiting & large amounts of rectal bleeding.  CT suggests colitis of transverse, descending & proximal sigmoid colon.  I would suspect acute infectious process & we need to r/o c difficile given recent antibiotic use.  Ischemia remains in the differential, as well as inflammatory bowel disease (less likely).   She is noted to have diverticulosis which could also be bleeding.  At some point, she will need  colonoscopy as it has been over 10 yrs since her last exam.  As a side note, she has cholelithiasis without evidence of  cholecystitis.    Should have follow-up regarding right lung nodule w/ PCP.  Plan: 1. Full set of stool studies including c diff, culture, lactoferrin, giardia. 2. Agree w/ IV antibiotics & supportive measures. 3. Add flagyl 250mg  QID 4. Continue clear liquids 5. FU PCP re: lung nodule 6. Outpatient colonoscopy in a couple of weeks once over acute episode   LOS: 1 day   Lorenza Burton  04/09/2011, 8:42 AM

## 2011-04-10 DIAGNOSIS — K5289 Other specified noninfective gastroenteritis and colitis: Secondary | ICD-10-CM

## 2011-04-10 LAB — CBC
HCT: 41.2 % (ref 36.0–46.0)
MCV: 87.3 fL (ref 78.0–100.0)
RBC: 4.72 MIL/uL (ref 3.87–5.11)
WBC: 12.3 10*3/uL — ABNORMAL HIGH (ref 4.0–10.5)

## 2011-04-10 LAB — BASIC METABOLIC PANEL
BUN: 6 mg/dL (ref 6–23)
CO2: 28 mEq/L (ref 19–32)
Chloride: 105 mEq/L (ref 96–112)
Creatinine, Ser: 0.92 mg/dL (ref 0.50–1.10)

## 2011-04-10 LAB — FECAL LACTOFERRIN, QUANT

## 2011-04-10 NOTE — Progress Notes (Signed)
Subjective: Lower abdominal pain, states due for pain pill. "burning". States continued bloody stools, "shaping up" though. "Demolishing" clear liquid diet. No emesis. In good spirits.   Objective: Vital signs in last 24 hours: Temp:  [98.2 F (36.8 C)-99.1 F (37.3 C)] 98.7 F (37.1 C) (11/14 0502) Pulse Rate:  [60-68] 60  (11/14 0502) Resp:  [18-24] 18  (11/14 0502) BP: (120-154)/(68-72) 120/68 mmHg (11/14 0502) SpO2:  [93 %-98 %] 93 % (11/14 0502) Weight:  [199 lb 4.8 oz (90.402 kg)] 199 lb 4.8 oz (90.402 kg) (11/14 0300) Last BM Date: 04/09/11 General:   Alert and oriented, pleasant Head:  Normocephalic and atraumatic. Eyes:  No icterus, sclera clear. Conjuctiva pink.  Mouth:  Without lesions, mucosa pink and moist.  Heart:  S1, S2 present, no murmurs noted.  Lungs: Clear to auscultation bilaterally, without wheezing, rales, or rhonchi.  Abdomen:  Bowel sounds present, soft, mildly TTP lower abdomen, non-distended. No HSM or hernias noted. No rebound or guarding. No masses appreciated  Msk:  Symmetrical without gross deformities. Normal posture. Pulses:  Normal pulses noted. Extremities:  Without clubbing or edema. Neurologic:  Alert and  oriented x4;  grossly normal neurologically. Skin:  Warm and dry, intact without significant lesions.  Psych:  Alert and cooperative. Normal mood and affect.  Intake/Output from previous day: 11/13 0701 - 11/14 0700 In: 2040 [P.O.:940; I.V.:900; IV Piggyback:200] Out: 1552 [Urine:1550; Stool:2] Intake/Output this shift:    Lab Results:  Basename 04/10/11 0501 04/09/11 0535 04/08/11 1519  WBC 12.3* 16.1* 20.0*  HGB 13.7 13.8 17.1*  HCT 41.2 41.6 51.0*  PLT 270 316 416*   BMET  Basename 04/10/11 0501 04/08/11 1519  NA 139 142  K 3.4* 3.5  CL 105 102  CO2 28 29  GLUCOSE 89 127*  BUN 6 15  CREATININE 0.92 0.92  CALCIUM 8.7 10.5   LFT  Basename 04/08/11 1519  PROT 8.4*  ALBUMIN 4.5  AST 19  ALT 14  ALKPHOS 76  BILITOT  0.4  BILIDIR --  IBILI --   Cdiff PCR NEGATIVE Lactoferrin POSITIVE Stool culture and Giardia PENDING  Studies/Results: Ct Abdomen Pelvis W Contrast  04/08/2011  *RADIOLOGY REPORT*  Clinical Data: Rectal bleeding.  Nausea and vomiting.  Previous partial hysterectomy.  CT ABDOMEN AND PELVIS WITH CONTRAST  Technique:  Multidetector CT imaging of the abdomen and pelvis was performed following the standard protocol during bolus administration of intravenous contrast.  Contrast: OMNIPAQUE IOHEXOL 300 MG/ML IV SOLN  Comparison: None.  Findings: Moderate sized hiatal hernia.  Diffuse low density wall thickening involving the distal transverse colon, splenic flexure, descending colon and proximal sigmoid colon.  Associated pericolonic soft tissue stranding.  Multiple sigmoid colon diverticula.  Normal appearing appendix.  No enlarged lymph nodes.  1.5 cm noncalcified gallstone in the gallbladder without gallbladder wall thickening or pericholecystic fluid.  Unremarkable liver, spleen, pancreas, adrenal glands, kidneys and urinary bladder.  Small uterus containing calcified and noncalcified masses.  Unremarkable right ovary.  The left ovary is not visualized.  4 mm subpleural nodule in the lateral aspect of the right middle lobe on image number 30.  Atheromatous arterial calcifications. Normal opacification of the superior mesenteric artery and vein and their branches.  Lumbar and lower thoracic spine degenerative changes.  IMPRESSION:  1.  Transverse, descending and proximal sigmoid colon colitis. This is most likely infectious or inflammatory in nature.  Ischemic colitis is less likely. 2.  Sigmoid diverticulosis. 3.  Cholelithiasis without evidence of  cholecystitis. 4.  Moderate-sized hiatal hernia. 5.  Small uterus containing fibroids. 6.  4 mm subpleural right middle lobe noncalcified nodule.  An elective chest CT with contrast would be useful for establishing a baseline for follow up.  Original Report  Authenticated By: Darrol Angel, M.D.    Assessment: 65 year old female admitted with colitis, thus far Cdiff negative, lactoferrin positive. Other studies pending. Seems to be slowly improving, stools "firming up" but continued evidence of hematochezia. Tolerating diet well. Hgb stable, WBC improving.    Plan: Continue Abx Follow-up pending stool studies (Giardia, Stool culture) Follow-up with PCP regarding lung nodule as outpatient Continue clears for now; consider increasing to low-residue diet in next 24 hours if continues to improve Colonoscopy likely as outpatient after acute situation resolves    LOS: 2 days   [name]  04/10/2011, 7:56 AM

## 2011-04-10 NOTE — Progress Notes (Signed)
NAMEEARLISHA, [name]                   ACCOUNT NO.:  [id number]  MEDICAL RECORD NO.:  [id number]  LOCATION:  A334                          FACILITY:  [facility]  PHYSICIAN:  Desiraye Rolfson D. Felecia Shelling, MD   DATE OF BIRTH:  [birth year]  DATE OF PROCEDURE:  04/10/2011 DATE OF DISCHARGE:                                PROGRESS NOTE   SUBJECTIVE:  The patient still has rectal bleeding.  She also has abdominal pain and discomfort.  No nausea or vomiting.  OBJECTIVE:  GENERAL:  The patient is alert, awake, but sick looking. VITAL SIGNS:  Blood pressure 120/68, pulse 60, respiratory rate 18, temperature 98.2 degrees Fahrenheit. CHEST:  Decreased air entry, few rhonchi. CARDIOVASCULAR SYSTEM:  First and second heart sounds heard.  No murmur. No gallop.  ABDOMEN:  Soft and lax.  Bowel sound is positive.  There is mild tenderness in the lower quadrant.  LABORATORY DATA:  WBC 12.3, hemoglobin 13.7, hematocrit 41.2, and platelets 270,000.  Sodium 139, potassium 3.4, chloride 105, carbon dioxide 28, glucose 88, BUN 6, creatinine 0.92, calcium 8.7.  Stool study result is pending.  ASSESSMENT: 1. Abdominal pain with rectal bleeding, to rule out colitis infectious     versus ischemic. 2. Hypertension. 3. Hyperlipidemia. 4. Coronary artery disease.  PLAN:  We will continue the patient on current IV antibiotics and pain management.  A GI consult appreciated and we will continue to follow her CBC and stool workup.     Amenda Duclos D. Felecia Shelling, MD     TDF/MEDQ  D:  04/10/2011  T:  04/10/2011  Job:  [id number]

## 2011-04-10 NOTE — Progress Notes (Signed)
Pt had c/o nausea after lunch.  Dose of zofran given.  Upon reassessment, pts nausea resolved.  Had no vomiting.

## 2011-04-11 DIAGNOSIS — K5289 Other specified noninfective gastroenteritis and colitis: Secondary | ICD-10-CM

## 2011-04-11 LAB — CBC
HCT: 39.3 % (ref 36.0–46.0)
MCH: 28.9 pg (ref 26.0–34.0)
MCHC: 33.1 g/dL (ref 30.0–36.0)
MCV: 87.3 fL (ref 78.0–100.0)
RDW: 15 % (ref 11.5–15.5)

## 2011-04-11 MED ORDER — SODIUM CHLORIDE 0.9 % IJ SOLN
INTRAMUSCULAR | Status: AC
  Filled 2011-04-11: qty 3

## 2011-04-11 MED ORDER — SODIUM CHLORIDE 0.9 % IJ SOLN
INTRAMUSCULAR | Status: AC
  Administered 2011-04-11: 10 mL
  Filled 2011-04-11: qty 3

## 2011-04-11 MED ORDER — CIPROFLOXACIN HCL 250 MG PO TABS
500.0000 mg | ORAL_TABLET | Freq: Two times a day (BID) | ORAL | Status: DC
  Administered 2011-04-12 – 2011-04-17 (×11): 500 mg via ORAL
  Filled 2011-04-11: qty 2
  Filled 2011-04-11: qty 1
  Filled 2011-04-11 (×6): qty 2
  Filled 2011-04-11: qty 1
  Filled 2011-04-11 (×3): qty 2

## 2011-04-11 NOTE — Progress Notes (Signed)
[name], [name]                   ACCOUNT NO.:  [id number]  MEDICAL RECORD NO.:  [id number]  LOCATION:  A334                          FACILITY:  [facility]  PHYSICIAN:  Reigan Tolliver D. Felecia Shelling, MD   DATE OF BIRTH:  1945-09-23  DATE OF PROCEDURE:  04/11/2011 DATE OF DISCHARGE:                                PROGRESS NOTE   SUBJECTIVE:  The patient still complains of lower quadrant pain.  Her nausea, vomiting, and rectal bleeding has subsided.  No fever or chills.  OBJECTIVE:  GENERAL:  The patient is alert, awake, chronically sick looking. VITAL SIGNS:  Blood pressure 150/64, pulse 55, respiratory rate 20, temperature 98 degrees Fahrenheit. CHEST:  Clear lung fields.  Good air entry. CARDIOVASCULAR SYSTEM:  First and second heart sound heard.  No murmur. No gallop.  ABDOMEN:  Soft and lax.  Bowel sound is positive.  There is mild left lower quadrant tenderness.  LABORATORY DATA:  WBC 10, hemoglobin 13.0, hematocrit 39.3, platelets 241,000.  Stool for C. difficile is negative.  ASSESSMENT: 1. Abdominal pain, nausea, vomiting with rectal bleed, probably     secondary to colitis, infectious versus ischemic. 2. Hypertension. 3. Coronary artery disease. 4. Hyperlipidemia.  PLAN:  We will continue the patient on IV antibiotics.  Continue current medications.  We will continue pain management and supportive care.     Malina Geers D. Felecia Shelling, MD     TDF/MEDQ  D:  04/11/2011  T:  04/11/2011  Job:  [id number]

## 2011-04-11 NOTE — Progress Notes (Signed)
UR Chart Review Completed  

## 2011-04-11 NOTE — Progress Notes (Signed)
Subjective: [name] is a 65 y.o. female admitted with colitis. States she is feeling better. Less abdominal pain but still taking pain medication. No nausea or vomiting. Last blood in stool noted over 24 hours ago. No bowel movements today. Eating all of her clear liquid diet.  Objective: Vital signs in last 24 hours: Temp:  [98 F (36.7 C)-98.4 F (36.9 C)] 98 F (36.7 C) (11/15 0559) Pulse Rate:  [55-59] 55  (11/15 0559) Resp:  [20] 20  (11/15 0559) BP: (115-146)/(64-69) 146/69 mmHg (11/15 0559) SpO2:  [92 %-94 %] 92 % (11/15 0559) Weight:  [196 lb 10.4 oz (89.2 kg)] 196 lb 10.4 oz (89.2 kg) (11/15 0541) Last BM Date: 04/10/11 General:   Alert,  Well-developed, well-nourished, pleasant and cooperative in NAD Head:  Normocephalic and atraumatic. Eyes:  Sclera clear, no icterus.   Abdomen:  Soft, nontender and nondistended. Normal bowel sounds, without guarding, and without rebound.   Extremities:  Without clubbing, deformity or edema. Neurologic:  Alert and  oriented x4;  grossly normal neurologically. Skin:  Intact without significant lesions or rashes. Psych:  Alert and cooperative. Normal mood and affect.  Intake/Output from previous day: 11/14 0701 - 11/15 0700 In: 2000 [I.V.:1800; IV Piggyback:200] Out: 2150 [Urine:2150] Intake/Output this shift:    Lab Results: CBC  Basename 04/11/11 0455 04/10/11 0501 04/09/11 0535  WBC 10.0 12.3* 16.1*  HGB 13.0 13.7 13.8  HCT 39.3 41.2 41.6  MCV 87.3 87.3 85.8  PLT 241 270 316   BMET  Basename 04/10/11 0501 04/08/11 1519  NA 139 142  K 3.4* 3.5  CL 105 102  CO2 28 29  GLUCOSE 89 127*  BUN 6 15  CREATININE 0.92 0.92  CALCIUM 8.7 10.5   LFTs  Basename 04/08/11 1519  BILITOT 0.4  BILIDIR --  IBILI --  ALKPHOS 76  AST 19  ALT 14  PROT 8.4*  ALBUMIN 4.5     Assessment: Acute colitis associated with bloody diarrhea. Stool culture preliminary still negative. C. difficile negative. Patient clinically is  improving. Remains on Cipro IV. Also on IV protonix.  Plan: #1 To discuss with Dr. Jena Gauss. Consider advancing to low residue diet and switching antibiotics and PPI to PO. #2 Colonoscopy as an outpatient. #3 Followup pending stool studies. #4 Followup with PCP regarding lung nodule as an outpatient.   LOS: 3 days   Tana Coast  04/11/2011, 8:24 AM

## 2011-04-12 ENCOUNTER — Telehealth: Payer: Self-pay | Admitting: Gastroenterology

## 2011-04-12 MED ORDER — PANTOPRAZOLE SODIUM 40 MG PO TBEC
40.0000 mg | DELAYED_RELEASE_TABLET | Freq: Every day | ORAL | Status: DC
  Administered 2011-04-12 – 2011-04-17 (×6): 40 mg via ORAL
  Filled 2011-04-12 (×6): qty 1

## 2011-04-12 MED ORDER — FLORA-Q PO CAPS
1.0000 | ORAL_CAPSULE | Freq: Every day | ORAL | Status: DC
  Administered 2011-04-12 – 2011-04-16 (×5): 1 via ORAL
  Filled 2011-04-12 (×5): qty 1

## 2011-04-12 NOTE — Telephone Encounter (Signed)
Needs OV in 3-4 weeks to schedule for colonoscopy.

## 2011-04-12 NOTE — Progress Notes (Addendum)
Subjective: Tolerating low residue diet without nausea or vomiting. No bowel movement in over one day and no blood in the stool for 48 hours. Feels like she's getting a cold. Some lower abdominal pain but overall feeling better.   Objective: Vital signs in last 24 hours: Temp:  [97.5 F (36.4 C)-98.4 F (36.9 C)] 98.4 F (36.9 C) (11/16 0511) Pulse Rate:  [55-60] 58  (11/16 0511) Resp:  [20] 20  (11/16 0511) BP: (120-151)/(68-81) 151/75 mmHg (11/16 0511) SpO2:  [94 %-95 %] 94 % (11/16 0511) Weight:  [205 lb 4 oz (93.1 kg)] 205 lb 4 oz (93.1 kg) (11/16 0542) Last BM Date: 04/09/11  General:   Alert,  Well-developed, well-nourished, pleasant and cooperative in NAD Head:  Normocephalic and atraumatic. Eyes:  Sclera clear, no icterus.  Chest: CTA bilaterally without rales, rhonchi, crackles.    Heart:  Regular rate and rhythm; no murmurs, clicks, rubs,  or gallops. Abdomen:  Soft, nontender and nondistended. Normal bowel sounds, without guarding, and without rebound.   Extremities:  Without clubbing, deformity or edema. Neurologic:  Alert and  oriented x4;  grossly normal neurologically. Skin:  Intact without significant lesions or rashes. Psych:  Alert and cooperative. Normal mood and affect.  Intake/Output from previous day: 11/15 0701 - 11/16 0700 In: 1375 [I.V.:1375] Out: 3250 [Urine:3250] Intake/Output this shift:    Lab Results: CBC  Basename 04/11/11 0455 04/10/11 0501  WBC 10.0 12.3*  HGB 13.0 13.7  HCT 39.3 41.2  MCV 87.3 87.3  PLT 241 270   BMET  Basename 04/10/11 0501  NA 139  K 3.4*  CL 105  CO2 28  GLUCOSE 89  BUN 6  CREATININE 0.92  CALCIUM 8.7   Assessment: Acute colitis associated with bloody diarrhea. Suspect infectious etiology although ischemia remains in the differential. Clinically improved. Stool culture preliminary is still negative.  Plan: #1 colonoscopy as an outpatient #2 followup pending stool culture #3 complete 10 day course of  Cipro #4 add probiotic for the next one month   LOS: 4 days   Tana Coast  04/12/2011, 7:55 AM

## 2011-04-12 NOTE — Progress Notes (Signed)
Valerie, [name]                   ACCOUNT NO.:  [id number]  MEDICAL RECORD NO.:  [id number]  LOCATION:  A334                          FACILITY:  [facility]  PHYSICIAN:  Washington Whedbee D. Felecia Shelling, MD   DATE OF BIRTH:  [birth year]  DATE OF PROCEDURE:  04/12/2011 DATE OF DISCHARGE:                                PROGRESS NOTE   SUBJECTIVE:  The patient is feeling slightly better.  Still she has lower abdominal pain.  She did have bowel movement in 3 days.  Her nausea and vomiting has improved.  OBJECTIVE:  GENERAL:  The patient is alert, awake, and sick looking. VITAL SIGNS:  Blood pressure 122/68, pulse 55, respiratory rate 20, temperature 97 degrees Fahrenheit. CHEST:  Decreased air entry, few rhonchi. CARDIOVASCULAR SYSTEM:  First and second heart sound heard.  No murmur. No gallop.  ABDOMEN:  Soft and lax.  Bowel sounds positive.  No mass or organomegaly. EXTREMITIES:  No leg edema.  ASSESSMENT: 1. Colitis, infectious versus ischemic. 2. Hypertension. 3. Hyperlipidemia. 4. Coronary artery disease.  PLAN:  We will continue the patient on current medications including IV antibiotics.  We will do CBC, BMP.  Continue regular medications.     Skylar Flynt D. Felecia Shelling, MD     TDF/MEDQ  D:  04/12/2011  T:  04/12/2011  Job:  [id number]

## 2011-04-13 LAB — CBC
Hemoglobin: 13.9 g/dL (ref 12.0–15.0)
MCH: 28.4 pg (ref 26.0–34.0)
MCHC: 33.3 g/dL (ref 30.0–36.0)
Platelets: 290 10*3/uL (ref 150–400)

## 2011-04-13 LAB — STOOL CULTURE

## 2011-04-13 MED ORDER — METOPROLOL SUCCINATE ER 50 MG PO TB24
50.0000 mg | ORAL_TABLET | Freq: Every day | ORAL | Status: DC
  Administered 2011-04-14 – 2011-04-16 (×3): 50 mg via ORAL
  Filled 2011-04-13 (×3): qty 1

## 2011-04-13 MED ORDER — POLYETHYLENE GLYCOL 3350 17 G PO PACK
17.0000 g | PACK | Freq: Every day | ORAL | Status: DC
  Administered 2011-04-13 – 2011-04-15 (×3): 17 g via ORAL
  Filled 2011-04-13 (×4): qty 1

## 2011-04-13 NOTE — Progress Notes (Signed)
Subjective: This is a patient of Dr. Letitia Neri who is in the hospital with colitis. She says that she's feeling pretty well and not complaining of any abdominal pain but she is concerned because she has not had a bowel movement she says. Her blood pressure has been somewhat elevated which she says is unusual for her.  Objective: Vital signs in last 24 hours: Temp:  [97.8 F (36.6 C)-98.4 F (36.9 C)] 98.3 F (36.8 C) (11/17 0618) Pulse Rate:  [56-65] 56  (11/17 0618) Resp:  [20] 20  (11/17 0618) BP: (118-189)/(73-94) 189/92 mmHg (11/17 0618) SpO2:  [98 %] 98 % (11/17 0618) Weight:  [91.2 kg (201 lb 1 oz)] 201 lb 1 oz (91.2 kg) (11/17 0618) Weight change: -1.9 kg (-4 lb 3 oz) Last BM Date: 04/10/11  Intake/Output from previous day: 11/16 0701 - 11/17 0700 In: 1468 [P.O.:720; I.V.:748] Out: 3400 [Urine:3400]  PHYSICAL EXAM General appearance: alert, cooperative and no distress Resp: clear to auscultation bilaterally Cardio: regular rate and rhythm, S1, S2 normal, no murmur, click, rub or gallop GI: soft, non-tender; bowel sounds normal; no masses,  no organomegaly Extremities: extremities normal, atraumatic, no cyanosis or edema  Lab Results:    Basic Metabolic Panel: No results found for this basename: NA:2,K:2,CL:2,CO2:2,GLUCOSE:2,BUN:2,CREATININE:2,CALCIUM:2,MG:2,PHOS:2 in the last 72 hours Liver Function Tests: No results found for this basename: AST:2,ALT:2,ALKPHOS:2,BILITOT:2,PROT:2,ALBUMIN:2 in the last 72 hours No results found for this basename: LIPASE:2,AMYLASE:2 in the last 72 hours No results found for this basename: AMMONIA:2 in the last 72 hours CBC:  Basename 04/13/11 0505 04/11/11 0455  WBC 8.9 10.0  NEUTROABS -- --  HGB 13.9 13.0  HCT 41.7 39.3  MCV 85.1 87.3  PLT 290 241   Cardiac Enzymes: No results found for this basename: CKTOTAL:3,CKMB:3,CKMBINDEX:3,TROPONINI:3 in the last 72 hours BNP: No results found for this basename: POCBNP:3 in the last 72  hours D-Dimer: No results found for this basename: DDIMER:2 in the last 72 hours CBG: No results found for this basename: GLUCAP:6 in the last 72 hours Hemoglobin A1C: No results found for this basename: HGBA1C in the last 72 hours Fasting Lipid Panel: No results found for this basename: CHOL,HDL,LDLCALC,TRIG,CHOLHDL,LDLDIRECT in the last 72 hours Thyroid Function Tests: No results found for this basename: TSH,T4TOTAL,FREET4,T3FREE,THYROIDAB in the last 72 hours Anemia Panel: No results found for this basename: VITAMINB12,FOLATE,FERRITIN,TIBC,IRON,RETICCTPCT in the last 72 hours Coagulation: No results found for this basename: LABPROT:2,INR:2 in the last 72 hours Urine Drug Screen:  Alcohol Level: No results found for this basename: ETH:2 in the last 72 hours Urinalysis:  Misc. Labs:  ABGS No results found for this basename: PHART,PCO2,PO2ART,TCO2,HCO3 in the last 72 hours CULTURES Recent Results (from the past 240 hour(s))  STOOL CULTURE     Status: Normal (Preliminary result)   Collection Time   04/08/11  7:31 PM      Component Value Range Status Comment   Specimen Description STOOL   Final    Special Requests NONE   Final    Culture     Final    Value: NO SUSPICIOUS COLONIES, CONTINUING TO HOLD     Note: REDUCED NORMAL FLORA PRESENT   Report Status PENDING   Incomplete   CLOSTRIDIUM DIFFICILE BY PCR     Status: Normal   Collection Time   04/09/11 10:09 AM      Component Value Range Status Comment   C difficile by pcr NEGATIVE  NEGATIVE  Final    Studies/Results: No results found.  Medications:  Scheduled:   . ciprofloxacin  500 mg Oral BID  . Flora-Q  1 capsule Oral Daily  . metoprolol  25 mg Oral Daily  . pantoprazole  40 mg Oral Q1200  . simvastatin  20 mg Oral QHS   Continuous:   . sodium chloride 75 mL/hr at 04/12/11 2015   JXB:JYNWGNFAOZHYQ, acetaminophen, alum & mag hydroxide-simeth, HYDROmorphone, ondansetron (ZOFRAN) IV, ondansetron  Assesment:  She has colitis but seems to be doing better. She has been able to eat. She has not had a bowel movement. She has had some problems with her blood pressure which apparently is unusual for her although she does have a diagnosis of hypertension Active Problems:  * No active hospital problems. *     Plan: I will increase her metoprolol. I will give her a dose of MiraLAX.    LOS: 5 days   Ambreen Tufte L 04/13/2011, 11:20 AM

## 2011-04-14 NOTE — Progress Notes (Signed)
NAMEKIMBA, LOTTES                   ACCOUNT NO.:  [id number]  MEDICAL RECORD NO.:  [id number]  LOCATION:  A334                          FACILITY:  [facility]  PHYSICIAN:  Corabelle Spackman D. Felecia Shelling, MD   DATE OF BIRTH:  Mar 19, 1946  DATE OF PROCEDURE:  04/14/2011 DATE OF DISCHARGE:                                PROGRESS NOTE   SUBJECTIVE:  The patient still has rectal bleeding.  She is also complaining of constipation.  No nausea or vomiting.  Her abdominal pain is less.  OBJECTIVE:  GENERAL:  The patient is alert, awake, and sick looking. VITAL SIGNS:  Blood pressure 157/87, pulse 52, respiratory rate 16, temperature 97 degrees Fahrenheit. CHEST:  Clear lung fields.  Good air entry. CARDIOVASCULAR SYSTEM:  First and second heart sounds heard.  No murmur. No gallop.  ABDOMEN:  Soft and lax.  Bowel sounds positive.  No mass or organomegaly. EXTREMITIES:  No leg edema.  LABORATORY DATA:  CBC:  WBC 8.9, hemoglobin 13.9, hematocrit 41.7, and platelet 290,000.  ASSESSMENT: 1. Colitis ischemic versus infectious. 2. Rectal bleeding secondary to the above. 3. Hypertension. 4. Hyperlipidemia. 5. Coronary artery disease.  PLAN:  We will continue the patient on IV antibiotics.  We will continue pain management.  We will continue laxatives.  We will discuss with GI for further evaluation.     Butler Vegh D. Felecia Shelling, MD     TDF/MEDQ  D:  04/14/2011  T:  04/14/2011  Job:  [id number]

## 2011-04-15 DIAGNOSIS — K5289 Other specified noninfective gastroenteritis and colitis: Secondary | ICD-10-CM

## 2011-04-15 DIAGNOSIS — K625 Hemorrhage of anus and rectum: Secondary | ICD-10-CM

## 2011-04-15 MED ORDER — BISACODYL 5 MG PO TBEC: 10.0000 mg | DELAYED_RELEASE_TABLET | ORAL | Status: AC

## 2011-04-15 MED ORDER — BISACODYL 5 MG PO TBEC
10.0000 mg | DELAYED_RELEASE_TABLET | ORAL | Status: AC
  Administered 2011-04-15 (×2): 10 mg via ORAL
  Filled 2011-04-15: qty 2
  Filled 2011-04-15: qty 1

## 2011-04-15 MED ORDER — PEG 3350-KCL-NABCB-NACL-NASULF 236 G PO SOLR
4000.0000 mL | Freq: Once | ORAL | Status: AC
  Administered 2011-04-15: 4000 mL via ORAL
  Filled 2011-04-15: qty 4000

## 2011-04-15 NOTE — Progress Notes (Signed)
NAMESUKANYA, GOLDBLATT                   ACCOUNT NO.:  [id number]  MEDICAL RECORD NO.:  [id number]  LOCATION:  A334                          FACILITY:  [facility]  PHYSICIAN:  Malu Pellegrini D. Felecia Shelling, MD   DATE OF BIRTH:  [birth year]  DATE OF PROCEDURE:  04/15/2011 DATE OF DISCHARGE:                                PROGRESS NOTE   SUBJECTIVE:  The patient still has rectal bleeding and lower abdominal pain.  Her nausea and vomiting has subsided.  OBJECTIVE:  GENERAL:  The patient is alert, awake, and resting.  Not in any form of distress. VITAL SIGNS:  Blood pressure 152/87, pulse 65, respiratory rate 18, temperature 98 degrees Fahrenheit. CHEST:  Clear lung fields.  Good air entry. CARDIOVASCULAR:  First and second heart sounds heard.  No murmur.  No gallop. ABDOMEN:  Soft and lax.  Bowel sound is positive.  No mass or organomegaly. EXTREMITIES:  No leg edema.  ASSESSMENT: 1. Colitis. 2. Rectal bleeding secondary to above. 3. History of hypertension. 4. Hyperlipidemia. 5. Coronary artery disease.  PLAN:  We will continue the patient on current medications.  The patient is being followed by GI and I will follow GI recommendation.     Covey Baller D. Felecia Shelling, MD     TDF/MEDQ  D:  04/15/2011  T:  04/15/2011  Job:  [id number]

## 2011-04-15 NOTE — Progress Notes (Addendum)
Subjective: Pt notes her diarrhea had resolved.  She went several days without a BM.  Then within the last 24 hrs, she has had 3.  She is seeing burgundy blood streaked in her stool.  Hgb is stable.  WBC much improved.  Denies any abdominal pain.  Questions whether she can go home.  Objective: Vital signs in last 24 hours: Temp:  [98 F (36.7 C)-98.5 F (36.9 C)] 98.5 F (36.9 C) (11/19 0626) Pulse Rate:  [55-60] 55  (11/19 0626) Resp:  [18] 18  (11/19 0626) BP: (152-157)/(87) 152/87 mmHg (11/19 0626) SpO2:  [95 %-97 %] 95 % (11/19 0626) Weight:  [201 lb 1 oz (91.2 kg)-201 lb 1.6 oz (91.218 kg)] 201 lb 1 oz (91.2 kg) (11/19 0626) Last BM Date: 04/14/11 General:   Alert,  Well-developed, well-nourished, pleasant and cooperative in NAD Head:  Normocephalic and atraumatic. Eyes:  Sclera clear, no icterus.   Conjunctiva pink. Mouth:  No deformity or lesions, oropharynx pink & moist. Neck:  Supple; no masses or thyromegaly. Heart:  Regular rate and rhythm; no murmurs, clicks, rubs,  or gallops. Abdomen:  Soft, nontender and nondistended. No masses, hepatosplenomegaly or hernias noted. Normal bowel sounds, without guarding, and without rebound.   Msk:  Symmetrical without gross deformities. Normal posture. Pulses:  Normal pulses noted. Extremities:  Without clubbing or edema. Neurologic:  Alert and  oriented x4;  grossly normal neurologically. Skin:  Intact without significant lesions or rashes. Cervical Nodes:  No significant cervical adenopathy. Psych:  Alert and cooperative. Normal mood and affect.  Intake/Output from previous day: 11/18 0701 - 11/19 0700 In: 240 [P.O.:240] Out: 2125 [Urine:2125]  Lab Results:  [facility] 04/13/11 0505  WBC 8.9  HGB 13.9  HCT 41.7  PLT 290   Assessment: 1. Acute colitis: Resolved on antibiotics. 2. Rectal Bleeding: Continues.  Plan was for outpatient colonoscopy, however there is concern over persistent rectal bleeding.  Pt agrees w/  colonoscopy.  Plan: 1. Colonoscopy tomorrow 2. Complete course of cipro as planned 3. Continue flora q  LOS: 7 days   Lorenza Burton  04/15/2011, 8:40 AM   Pt admitted with colitis ? Ischemic: abd pain/NVD for > 24 hours and then developed rectal bleeding. LAST BM: 2 HOURS AGO-FMD/SOFT-NO BLOOD. TCS LONG TIME AGO (> 10 YEARS). TCS 11/20.

## 2011-04-16 ENCOUNTER — Encounter (HOSPITAL_COMMUNITY)
Admission: EM | Disposition: A | Discharge status: Home / Self Care | Payer: Self-pay | Source: Home / Self Care | Attending: Internal Medicine

## 2011-04-16 ENCOUNTER — Encounter (HOSPITAL_COMMUNITY): Payer: Self-pay | Admitting: *Deleted

## 2011-04-16 ENCOUNTER — Other Ambulatory Visit: Payer: Self-pay | Admitting: Gastroenterology

## 2011-04-16 DIAGNOSIS — K625 Hemorrhage of anus and rectum: Secondary | ICD-10-CM

## 2011-04-16 DIAGNOSIS — K5730 Diverticulosis of large intestine without perforation or abscess without bleeding: Secondary | ICD-10-CM

## 2011-04-16 DIAGNOSIS — K5289 Other specified noninfective gastroenteritis and colitis: Secondary | ICD-10-CM

## 2011-04-16 HISTORY — PX: COLONOSCOPY: SHX5424

## 2011-04-16 SURGERY — COLONOSCOPY
Anesthesia: Moderate Sedation

## 2011-04-16 MED ORDER — SODIUM CHLORIDE 0.45 % IV SOLN
Freq: Once | INTRAVENOUS | Status: AC
  Administered 2011-04-16: 12:00:00 via INTRAVENOUS

## 2011-04-16 MED ORDER — STERILE WATER FOR IRRIGATION IR SOLN
Status: DC | PRN
  Administered 2011-04-16: 13:00:00

## 2011-04-16 MED ORDER — MEPERIDINE HCL 100 MG/ML IJ SOLN
INTRAMUSCULAR | Status: AC
  Filled 2011-04-16: qty 1

## 2011-04-16 MED ORDER — MIDAZOLAM HCL 5 MG/5ML IJ SOLN
INTRAMUSCULAR | Status: AC
  Filled 2011-04-16: qty 10

## 2011-04-16 MED ORDER — MIDAZOLAM HCL 5 MG/5ML IJ SOLN
INTRAMUSCULAR | Status: DC | PRN
  Administered 2011-04-16: 1 mg via INTRAVENOUS
  Administered 2011-04-16: 2 mg via INTRAVENOUS
  Administered 2011-04-16: 1 mg via INTRAVENOUS
  Administered 2011-04-16: 2 mg via INTRAVENOUS

## 2011-04-16 MED ORDER — MEPERIDINE HCL 100 MG/ML IJ SOLN
INTRAMUSCULAR | Status: DC | PRN
  Administered 2011-04-16: 25 mg via INTRAVENOUS
  Administered 2011-04-16: 50 mg via INTRAVENOUS

## 2011-04-16 NOTE — Discharge Summary (Signed)
[name], [name]                   ACCOUNT NO.:  [id number]  MEDICAL RECORD NO.:  [id number]  LOCATION:  A334                          FACILITY:  [facility]  PHYSICIAN:  Magen Suriano D. Felecia Shelling, MD   DATE OF BIRTH:  04-Feb-1946  DATE OF ADMISSION:  04/08/2011 DATE OF DISCHARGE:  11/20/2012LH                              DISCHARGE SUMMARY   DISCHARGE DIAGNOSES: 1. Colitis probably secondary to infectious cause. 2. Ischemia of the colon. 3. Rectal bleed secondary to the above. 4. Hemorrhoids. 5. Coronary artery disease. 6. Hypertension. 7. Hyperlipidemia. 8. Right lung nodules. 9. Cholelithiasis. 10.Hiatal hernia.  DISCHARGE MEDICATIONS: 1. Metoprolol succinate 25 mg p.o. 24 hours. 2. Simvastatin 20 mg daily.  DISPOSITION:  The patient will be discharged home in stable condition.  DISCHARGE INSTRUCTIONS:  The patient will continue current medication. She will be followed in the office.  LABS ON DISCHARGE:  CBC; WBC 8.9, hemoglobin 13.9, hematocrit 41.7, platelets 290.  Stool culture was negative for pathogenic bacteria.  HOSPITAL COURSE:  This is a 65 year old female patient with a history of multiple medical illnesses who was admitted due to abdominal pain, nausea, and vomiting.  She had rectal bleeding.  The patient was admitted after she had CT scan of the abdomen, which showed colitis suggestive of an infectious cause.  The patient was started on IV antibiotics and GI consult was done.  Over the hospital stay, her abdominal pain, nausea, and vomiting improved.  She continued to have episode of rectal bleed.  GI assisted in her management.  Colonoscopy was done on the day of discharge, and the patient was found to have some hemorrhoids and ischemic changes in her colon.  The patient was advised to keep herself hydrated and will be followed in the outpatient. She is currently pain free and has been over rectal bleed.  Her CBC has been stable.  The patient will be discharged home to  be followed in the office in 1-week duration.     Charbel Los D. Felecia Shelling, MD     TDF/MEDQ  D:  04/16/2011  T:  04/16/2011  Job:  [id number]

## 2011-04-16 NOTE — Interval H&P Note (Signed)
History and Physical Interval Note:   04/16/2011   12:39 PM   [name]  has presented today for surgery, with the diagnosis of rectal bleeding/colitis  The various methods of treatment have been discussed with the patient and family. After consideration of risks, benefits and other options for treatment, the patient has consented to  Procedure(s): COLONOSCOPY as a surgical intervention .  The patients' history has been reviewed, patient examined, no change in status, stable for surgery.  I have reviewed the patients' chart and labs.  Questions were answered to the patient's satisfaction.     [name]  MD  THE PATIENT WAS EXAMINED AND THERE IS NO CHANGE IN THE PATIENT'S CONDITION SINCE THE ORIGINAL H&P WAS COMPLETED.

## 2011-04-16 NOTE — H&P (Signed)
Consult Note signed by Lorenza Burton, NP at 04/09/11 [phone]     Author: Lorenza Burton, NP Service: Gastroenterology Author Type: FAMILY NURSE PRACTITIONER    Filed: 04/09/11 0953 Note Time: 04/09/11 0758        Related Notes: Original Note by: Lorenza Burton, NP filed at 04/09/11 [phone]    Cosigner: Corbin Ade, MD at 04/09/11 1105               Referring Provider: Dr. Felecia Shelling Primary Care Physician:  Avon Gully, MD Primary Gastroenterologist:  Dr. Jena Gauss   Reason for Consultation:  Rectal  Bleeding & Abdominal Pain    Chief Complaint   Patient presents with   .  Nausea   .  Emesis   .  Rectal Bleeding        HPI:  [name] is a 65 y.o. female admitted by Dr. Felecia Shelling for rectal bleeding & abdominal pain.  She tells me she started vomiting at 11:30pm to 3:00 am 2 nights ago.  Had constant vomiting, then diarrhea TNTC.  Saw large amounts burgundy blood with the stool in the commode & on the toilet paper.  She had consumed a large amount of turnip greens, BBQ ribs, mac&cheese, cornbread & deviled eggs just prior to the onset of her pain.  C/o nausea, but no vomiting since yesterday.  Takes asa 81mg  daily.  Took 2 "cold pills" from Dr Letitia Neri office -thinks they may have been antibiotics finished 4 days ago.  Normally 1 BM daily.     Rare heartburn & indigestion before this episode.  She c/o cramp-like pain across lower abd 8/10 at worse, now 0/10 w/ pain meds.  Wt stable.  Appetite ok.  +Well water.  No foreign travel.       Past Medical History   Diagnosis  Date   .  Hypertension     .  GI bleed  Nov 2012   .  Colitis, ischemic         yrs ago   .  S/P colonoscopy         over 10 yrs ago-normal   .  Kidney stone         Past Surgical History   Procedure  Date   .  Spinal fusion         x3   .  Salpingoophorectomy         left   .  Coronary stent placement  1995   .  Coronary artery bypass graft  1998       Current Facility-Administered Medications   Medication   Dose  Route  Frequency  Provider  Last Rate  Last Dose   .  0.9 %  sodium chloride infusion     Intravenous  Once  Benny Lennert, MD         .  0.9 %  sodium chloride infusion     Intravenous  Continuous  Roy Fagan  75 mL/hr at 04/09/11 0412      .  acetaminophen (TYLENOL) tablet 650 mg   650 mg  Oral  Q6H PRN  Carylon Perches           Or   .  acetaminophen (TYLENOL) suppository 650 mg   650 mg  Rectal  Q6H PRN  Carylon Perches         .  alum & mag hydroxide-simeth (MAALOX/MYLANTA) 200-200-20 MG/5ML suspension 30 mL   30 mL  Oral  Q6H PRN  Carylon Perches         .  ciprofloxacin (CIPRO) IVPB 400 mg   400 mg  Intravenous  Once  Benny Lennert, MD     400 mg at 04/08/11 1805   .  diphenhydrAMINE (BENADRYL) injection 50 mg   50 mg  Intravenous  Once  Benny Lennert, MD     50 mg at 04/08/11 1548   .  hydrocortisone sodium succinate (SOLU-CORTEF) injection 200 mg   200 mg  Intravenous  Once  Benny Lennert, MD     200 mg at 04/08/11 1607   .  HYDROmorphone (DILAUDID) injection 0.5 mg   0.5 mg  Intravenous  Q3H PRN  Roy Fagan     0.5 mg at 04/09/11 0759   .  HYDROmorphone (DILAUDID) injection 1 mg   1 mg  Intravenous  Once  Benny Lennert, MD     1 mg at 04/08/11 1548   .  iohexol (OMNIPAQUE) 300 MG/ML injection 100 mL   100 mL  Intravenous  Once PRN  Roy Fagan     100 mL at 04/08/11 1742   .  metoprolol (LOPRESSOR) 50 MG tablet                  .  metoprolol (TOPROL-XL) 24 hr tablet 25 mg   25 mg  Oral  Daily  Roy Fagan     25 mg at 04/08/11 1930   .  ondansetron (ZOFRAN) injection 4 mg   4 mg  Intravenous  Once  Benny Lennert, MD     4 mg at 04/08/11 1548   .  ondansetron (ZOFRAN) tablet 4 mg   4 mg  Oral  Q6H PRN  Carylon Perches           Or   .  ondansetron [facility]) injection 4 mg   4 mg  Intravenous  Q6H PRN  Carylon Perches         .  pantoprazole (PROTONIX) injection 40 mg   40 mg  Intravenous  Q24H  Roy Fagan     40 mg at 04/08/11 2250   .  simvastatin (ZOCOR) tablet 20 mg   20 mg  Oral  QHS  Roy Fagan     20  mg at 04/08/11 2250   .  DISCONTD: hydrocortisone sodium succinate (SOLU-CORTEF) injection 100 mg   100 mg  Intravenous  Once  Benny Lennert, MD             Allergies as of 04/08/2011 - Review Complete 04/08/2011   Allergen  Reaction  Noted   .  Omnipaque (iohexol)    09/19/2010   .  Penicillins    09/19/2010       Family History   Problem  Relation  Age of Onset   .  Stomach cancer  Mother  41       ? believes it was stomach   .  Bone cancer  Sister  26   .  Stomach cancer  Sister  6       History       Social History   .  Marital Status:  Divorced       Spouse Name:  N/A       Number of Children:  0   .  Years of Education:  N/A       Occupational History   .  disabled  Social History Main Topics   .  Smoking status:  Former Smoker -- 1.0 packs/day for 40 years       Types:  Cigarettes       Quit date:  09/14/2010   .  Smokeless tobacco:  Not on file   .  Alcohol Use:  Yes         couple drinks/yr   .  Drug Use:  Yes         marijuana (QUIT couple yrs ago)   .  Sexually Active:  Not on file       Other Topics  Concern   .  Not on file       Social History Narrative     Lives alone    Review of Systems: Gen: +weakness, fatigue, malaise.  Denies any fever, chills, sweats, anorexia, weight loss, and sleep disorder CV: Denies chest pain, angina, palpitations, syncope, orthopnea, PND, peripheral edema, and claudication. Resp: Denies dyspnea at rest, dyspnea with exercise, cough, sputum, wheezing, coughing up blood, and pleurisy. GI: Denies vomiting blood, jaundice, and fecal incontinence.   Denies dysphagia or odynophagia. GU : Denies urinary burning, blood in urine, urinary frequency, urinary hesitancy, nocturnal urination, and urinary incontinence. MS: Denies joint pain, limitation of movement, and swelling, stiffness, low back pain, extremity pain. Denies muscle weakness, cramps, atrophy.   Derm: Denies rash, itching, dry skin, hives, moles, warts,  or unhealing ulcers.   Psych: Denies depression, anxiety, memory loss, suicidal ideation, hallucinations, paranoia, and confusion. Heme: Denies bruising, bleeding, and enlarged lymph nodes. Neuro:  Denies any headaches, dizziness, paresthesias. Endo:  Denies any problems with DM, thyroid, adrenal function.   Physical Exam: Vital signs in last 24 hours: Temp:  [97.7 F (36.5 C)-98.4 F (36.9 C)] 98.1 F (36.7 C) (11/13 0649) Pulse Rate:  [64-92] 92  (11/13 0649) Resp:  [18-20] 18  (11/13 0649) BP: (124-193)/(83-106) 152/83 mmHg (11/13 0649) SpO2:  [92 %-98 %] 92 % (11/13 0649) Weight:  [195 lb 12.3 oz (88.8 kg)-200 lb (90.719 kg)] 197 lb 3.2 oz (89.449 kg) (11/13 0500) Last BM Date: 04/09/11 General:   Alert,  Well-developed, well-nourished, pleasant and cooperative in NAD Head:  Normocephalic and atraumatic. Eyes:  Sclera clear, no icterus.   Conjunctiva pink. Ears:  Normal auditory acuity. Nose:  No deformity, discharge,  or lesions. Mouth:  No deformity or lesions.  Oropharynx pink & moist. Neck:  Supple; no masses or thyromegaly. Lungs:  Clear throughout to auscultation.   No wheezes, crackles, or rhonchi. No acute distress. Heart:  Regular rate and rhythm; no murmurs, clicks, rubs,  or gallops. Abdomen:  Soft, nontender and nondistended. No masses, hepatosplenomegaly or hernias noted. Normal bowel sounds, without guarding, and without rebound.    Rectal:  Deferred until time of colonoscopy.    Msk:  Symmetrical without gross deformities. Normal posture. Pulses:  Normal pulses noted. Extremities:  Without clubbing or edema. Neurologic:  Alert and  oriented x4;  grossly normal neurologically. Skin:  Intact without significant lesions or rashes. Cervical Nodes:  No significant cervical adenopathy. Psych:  Alert and cooperative. Normal mood and affect.   Lab Results:   [facility]  04/09/11 0535  04/08/11 1519   WBC  16.1*  20.0*   HGB  13.8  17.1*   HCT  41.6  51.0*   PLT   316  416*      BMET   Basename  04/08/11 1519   NA  142   K  3.5   CL  102   CO2  29   GLUCOSE  127*   BUN  15   CREATININE  0.92   CALCIUM  10.5      LFT   Basename  04/08/11 1519   PROT  8.4*   ALBUMIN  4.5   AST  19   ALT  14   ALKPHOS  76   BILITOT  0.4   BILIDIR  --   IBILI  --   LIPASE  --   AMYLASE  --      Studies/Results: Ct Abdomen Pelvis W Contrast   04/08/2011  *RADIOLOGY REPORT*  Clinical Data: Rectal bleeding.  Nausea and vomiting.  Previous partial hysterectomy.  CT ABDOMEN AND PELVIS WITH CONTRAST  Technique:  Multidetector CT imaging of the abdomen and pelvis was performed following the standard protocol during bolus administration of intravenous contrast.  Contrast: OMNIPAQUE IOHEXOL 300 MG/ML IV SOLN  Comparison: None.  Findings: Moderate sized hiatal hernia.  Diffuse low density wall thickening involving the distal transverse colon, splenic flexure, descending colon and proximal sigmoid colon.  Associated pericolonic soft tissue stranding.  Multiple sigmoid colon diverticula.  Normal appearing appendix.  No enlarged lymph nodes.  1.5 cm noncalcified gallstone in the gallbladder without gallbladder wall thickening or pericholecystic fluid.  Unremarkable liver, spleen, pancreas, adrenal glands, kidneys and urinary bladder.  Small uterus containing calcified and noncalcified masses.  Unremarkable right ovary.  The left ovary is not visualized.  4 mm subpleural nodule in the lateral aspect of the right middle lobe on image number 30.  Atheromatous arterial calcifications. Normal opacification of the superior mesenteric artery and vein and their branches.  Lumbar and lower thoracic spine degenerative changes.  IMPRESSION:  1.  Transverse, descending and proximal sigmoid colon colitis. This is most likely infectious or inflammatory in nature.  Ischemic colitis is less likely. 2.  Sigmoid diverticulosis. 3.  Cholelithiasis without evidence of cholecystitis. 4.   Moderate-sized hiatal hernia. 5.  Small uterus containing fibroids. 6.  4 mm subpleural right middle lobe noncalcified nodule.  An elective chest CT with contrast would be useful for establishing a baseline for follow up.  Original Report Authenticated By: Darrol Angel, M.D.     Impression: [name] is a 65 y.o. female w/ acute onset abdominal pain, nausea, vomiting & large amounts of rectal bleeding.  CT suggests colitis of transverse, descending & proximal sigmoid colon.  I would suspect acute infectious process & we need to r/o c difficile given recent antibiotic use.  Ischemia remains in the differential, as well as inflammatory bowel disease (less likely).   She is noted to have diverticulosis which could also be bleeding.  At some point, she will need colonoscopy as it has been over 10 yrs since her last exam.   As a side note, she has cholelithiasis without evidence of cholecystitis.     Should have follow-up regarding right lung nodule w/ PCP.   Plan: Full set of stool studies including c diff, culture, lactoferrin, giardia. Agree w/ IV antibiotics & supportive measures. Add flagyl 250mg  QID Continue clear liquids FU PCP re: lung nodule Outpatient colonoscopy in a couple of weeks once over acute episode    LOS: 1 day    Lorenza Burton  04/09/2011, 8:42 AM

## 2011-04-16 NOTE — Progress Notes (Signed)
[name], [name]                   ACCOUNT NO.:  [id number]  MEDICAL RECORD NO.:  [id number]  LOCATION:  A334                          FACILITY:  [facility]  PHYSICIAN:  Madhuri Vacca D. Felecia Shelling, MD   DATE OF BIRTH:  January 21, 1946  DATE OF PROCEDURE:  04/16/2011 DATE OF DISCHARGE:                                PROGRESS NOTE   SUBJECTIVE:  The patient had frequent bowel movements after she received GoLYTELY for bowel preparation.  She has still rectal bleed.  The patient is scheduled for colonoscopy today.  OBJECTIVE:  GENERAL:  The patient is alert, awake, sick looking. VITAL SIGNS:  Blood pressure 147/74, pulse 53, respiratory rate 18, temperature 97 degree Fahrenheit. CHEST:  Clear lung fields.  Good air entry. CARDIOVASCULAR SYSTEM:  First and second heart sounds heard.  No murmur. No gallop.  ABDOMEN:  Soft and lax.  Bowel sound is positive.  No mass or organomegaly. EXTREMITIES:  No leg edema.  ASSESSMENT: 1. Acute colitis, clinically improving. 2. Persistent rectal bleed. 3. History of coronary artery disease. 4. Hyperlipidemia. 5. Hypertension.  PLAN:  We will continue the patient on current antibiotics.  We will proceed with colonoscopy as planned by GI.     Letticia Bhattacharyya D. Felecia Shelling, MD     TDF/MEDQ  D:  04/16/2011  T:  04/16/2011  Job:  [id number]

## 2011-04-17 LAB — CBC
Hemoglobin: 13.1 g/dL (ref 12.0–15.0)
RBC: 4.74 MIL/uL (ref 3.87–5.11)
WBC: 7.4 10*3/uL (ref 4.0–10.5)

## 2011-04-17 NOTE — Progress Notes (Signed)
Discharge Summary: a/o.vss. Saline lock removed. Discharge instructions given. Pt verbalized understanding of instructions. Awaiting for family to arrive for discharge.

## 2011-04-17 NOTE — Progress Notes (Signed)
NAMEMARLENE, [name]                   ACCOUNT NO.:  [id number]  MEDICAL RECORD NO.:  [id number]  LOCATION:  A334                          FACILITY:  [facility]  PHYSICIAN:  Thaniel Coluccio D. Felecia Shelling, MD   DATE OF BIRTH:  12-Feb-1946  DATE OF PROCEDURE:  04/17/2011 DATE OF DISCHARGE:                                PROGRESS NOTE   SUBJECTIVE:  The patient is feeling better.  The patient had a colonoscopy yesterday.  No rectal bleeding, abdominal pain, nausea, or vomiting.  OBJECTIVE:  GENERAL:  The patient is alert, awake, and comfortable. VITAL SIGNS:  Blood pressure 121/69, pulse 48, respiratory rate 15, temperature 97.3 degree Fahrenheit.  CHEST:  Clear lung fields.  Good air entry.  CARDIOVASCULAR SYSTEM:  First and second heart sounds heard. No murmur, no gallop.  ABDOMEN:  Soft and lax.  Bowel sound is positive. No mass or organomegaly.  EXTREMITIES:  No leg edema.  LABORATORY DATA:  CBC:  WBC 8.9, hemoglobin 13.9, hematocrit 41.7, and platelets 290,000.  ASSESSMENT: 1. Colitis probably infectious. 2. Rectal bleeding. 3. Coronary artery disease. 4. Hypertension. 5. Hyperlipidemia.  PLAN:  We will discharge the patient home today.  We will continue her regular medications.  We will hold her aspirin at least for 2 weeks and we will re-evaluate the patient in the office in 1 week duration.     Betsie Peckman D. Felecia Shelling, MD     TDF/MEDQ  D:  04/17/2011  T:  04/17/2011  Job:  [id number]

## 2011-04-17 NOTE — Telephone Encounter (Signed)
Patient ended up having colonoscopy while inpatient. She needs hospital f/u in 1-2 months. Primary [facility] patient.

## 2011-04-22 ENCOUNTER — Telehealth: Payer: Self-pay | Admitting: Gastroenterology

## 2011-04-22 ENCOUNTER — Encounter (HOSPITAL_COMMUNITY): Payer: Self-pay | Admitting: Gastroenterology

## 2011-04-22 NOTE — Telephone Encounter (Signed)
Pt aware of results. I am going to mail her a hight fiber diet. Can we please get her an appointment with [facility] as per Dr. Darrick Penna.

## 2011-04-22 NOTE — Telephone Encounter (Signed)
Please call pt. She has colitis on the biopsies. Follow a high fiber diet. Follow up with Dr. Jena Gauss in [facility] 2012.

## 2011-04-24 ENCOUNTER — Encounter: Payer: Self-pay | Admitting: Internal Medicine

## 2011-04-24 NOTE — Telephone Encounter (Signed)
Routing to Susan. 

## 2011-04-24 NOTE — Telephone Encounter (Signed)
Pt is aware of OV on 1/7 at 1030 with [facility] and appt card was mailed

## 2011-04-25 NOTE — Telephone Encounter (Signed)
Pt has OV on 1/7 at 1030 with [facility]

## 2011-04-29 ENCOUNTER — Other Ambulatory Visit (HOSPITAL_COMMUNITY): Payer: Self-pay | Admitting: Internal Medicine

## 2011-04-29 DIAGNOSIS — R918 Other nonspecific abnormal finding of lung field: Secondary | ICD-10-CM

## 2011-04-29 NOTE — Telephone Encounter (Signed)
Results Cc to PCP  

## 2011-05-01 ENCOUNTER — Other Ambulatory Visit (HOSPITAL_COMMUNITY): Payer: Self-pay | Admitting: Internal Medicine

## 2011-05-01 ENCOUNTER — Ambulatory Visit (HOSPITAL_COMMUNITY)
Admission: RE | Admit: 2011-05-01 | Discharge: 2011-05-01 | Disposition: A | Discharge status: Home / Self Care | Payer: Medicare (Managed Care) | Source: Ambulatory Visit | Attending: Internal Medicine | Admitting: Internal Medicine

## 2011-05-01 DIAGNOSIS — R918 Other nonspecific abnormal finding of lung field: Secondary | ICD-10-CM

## 2011-05-01 DIAGNOSIS — J984 Other disorders of lung: Secondary | ICD-10-CM | POA: Insufficient documentation

## 2011-05-01 DIAGNOSIS — K8020 Calculus of gallbladder without cholecystitis without obstruction: Secondary | ICD-10-CM | POA: Insufficient documentation

## 2011-05-03 ENCOUNTER — Other Ambulatory Visit (HOSPITAL_COMMUNITY): Payer: Self-pay | Admitting: Internal Medicine

## 2011-05-03 DIAGNOSIS — Z09 Encounter for follow-up examination after completed treatment for conditions other than malignant neoplasm: Secondary | ICD-10-CM

## 2011-06-03 ENCOUNTER — Ambulatory Visit (INDEPENDENT_AMBULATORY_CARE_PROVIDER_SITE_OTHER): Payer: Medicare (Managed Care) | Admitting: Gastroenterology

## 2011-06-03 ENCOUNTER — Encounter: Payer: Self-pay | Admitting: Gastroenterology

## 2011-06-03 VITALS — BP 167/88 | HR 60 | Temp 98.1°F | Ht 66.0 in | Wt 202.8 lb

## 2011-06-03 DIAGNOSIS — K529 Noninfective gastroenteritis and colitis, unspecified: Secondary | ICD-10-CM

## 2011-06-03 DIAGNOSIS — K5289 Other specified noninfective gastroenteritis and colitis: Secondary | ICD-10-CM

## 2011-06-03 NOTE — Patient Instructions (Addendum)
Continue high fiber diet.  Let us know if you have recurrent abdominal pain, significant blood in stool, diarrhea. We will plan next colonoscopy in 03/2021. Office visit as needed.

## 2011-06-03 NOTE — Progress Notes (Signed)
Cc to PCP 

## 2011-06-03 NOTE — Progress Notes (Signed)
Primary Care Physician: Avon Gully, MD  Primary Gastroenterologist:  Roetta Sessions, MD   Chief Complaint  Patient presents with  . Follow-up    some blood in stool but not now    HPI: [name] is a 66 y.o. female here for hospital followup. In November 2012 she presented with acute onset abdominal pain, vomiting, hematochezia. CT showed colitis involving the transverse, descending and proximal sigmoid colon. She was treated as if it was an infectious colitis initially, however she ultimately require colonoscopy while hospitalized. Endoscopically appeared that she had a resolving ischemic colitis, biopsies were nonspecific. C. Difficile, stool culture both negative.  She reports that she's been doing very well. No abdominal pain. Yesterday 4 BMs with straining, little brbpr on toilet tissue. States she has a nervous stomach prior to going to the doctor. Very active. Eating lots of fruits/vegetables. No n/v. No heartburn. Wishes she could lose some weight. Denies heartburn, dysphagia, vomiting.  Current Outpatient Prescriptions  Medication Sig Dispense Refill  . aspirin 81 MG tablet Take 160 mg by mouth daily.        . cholecalciferol (VITAMIN D) 1000 UNITS tablet Take 1,000 Units by mouth daily.        Marland Kitchen lisinopril (PRINIVIL,ZESTRIL) 10 MG tablet Take 10 mg by mouth daily.        . metoprolol succinate (TOPROL-XL) 25 MG 24 hr tablet Take 25 mg by mouth daily.        . simvastatin (ZOCOR) 20 MG tablet Take 20 mg by mouth at bedtime.          Allergies as of 06/03/2011 - Review Complete 06/03/2011  Allergen Reaction Noted  . Omnipaque (iohexol)  09/19/2010  . Penicillins  09/19/2010    ROS:  General: Negative for anorexia, weight loss, fever, chills, fatigue, weakness. ENT: Negative for hoarseness, difficulty swallowing , nasal congestion. CV: Negative for chest pain, angina, palpitations, dyspnea on exertion, peripheral edema.  Respiratory: Negative for dyspnea at rest, dyspnea  on exertion, cough, sputum, wheezing.  GI: See history of present illness. GU:  Negative for dysuria, hematuria, urinary incontinence, urinary frequency, nocturnal urination.  Endo: Negative for unusual weight change.    Physical Examination:   BP 167/88  Pulse 60  Temp(Src) 98.1 F (36.7 C) (Temporal)  Ht 5\' 6"  (1.676 m)  Wt 202 lb 12.8 oz (91.989 kg)  BMI 32.73 kg/m2  General: Well-nourished, well-developed in no acute distress.  Eyes: No icterus. Mouth: Oropharyngeal mucosa moist and pink , no lesions erythema or exudate. Lungs: Clear to auscultation bilaterally.  Heart: Regular rate and rhythm, no murmurs rubs or gallops.  Abdomen: Bowel sounds are normal, nontender, nondistended, no hepatosplenomegaly or masses, no abdominal bruits or hernia , no rebound or guarding.   Extremities: No lower extremity edema. No clubbing or deformities. Neuro: Alert and oriented x 4   Skin: Warm and dry, no jaundice.   Psych: Alert and cooperative, normal mood and affect.  Labs:  Lab Results  Component Value Date   WBC 7.4 04/17/2011   HGB 13.1 04/17/2011   HCT 40.6 04/17/2011   MCV 85.7 04/17/2011   PLT 263 04/17/2011    Imaging Studies: No results found.

## 2011-06-03 NOTE — Assessment & Plan Note (Signed)
Clinically back at baseline and doing well. She has significant left-sided diverticulosis. She needs to continue high fiber diet. She needs to call us if she develops recurrent abdominal pain, hematochezia, diarrhea. Otherwise colonoscopy in November 2022. Office visit when necessary.

## 2011-07-01 ENCOUNTER — Encounter: Payer: Self-pay | Admitting: Internal Medicine

## 2011-11-01 ENCOUNTER — Ambulatory Visit (HOSPITAL_COMMUNITY): Payer: Medicare Other

## 2011-11-01 ENCOUNTER — Other Ambulatory Visit (HOSPITAL_COMMUNITY): Payer: Self-pay | Admitting: Internal Medicine

## 2011-11-01 ENCOUNTER — Ambulatory Visit (HOSPITAL_COMMUNITY)
Admission: RE | Admit: 2011-11-01 | Discharge: 2011-11-01 | Disposition: A | Discharge status: Home / Self Care | Payer: Medicare (Managed Care) | Source: Ambulatory Visit | Attending: Internal Medicine | Admitting: Internal Medicine

## 2011-11-01 DIAGNOSIS — Z951 Presence of aortocoronary bypass graft: Secondary | ICD-10-CM | POA: Insufficient documentation

## 2011-11-01 DIAGNOSIS — J984 Other disorders of lung: Secondary | ICD-10-CM | POA: Insufficient documentation

## 2011-11-01 DIAGNOSIS — R918 Other nonspecific abnormal finding of lung field: Secondary | ICD-10-CM | POA: Diagnosis not present

## 2011-11-01 DIAGNOSIS — Z139 Encounter for screening, unspecified: Secondary | ICD-10-CM

## 2011-11-01 DIAGNOSIS — K449 Diaphragmatic hernia without obstruction or gangrene: Secondary | ICD-10-CM | POA: Diagnosis not present

## 2011-11-01 DIAGNOSIS — Z09 Encounter for follow-up examination after completed treatment for conditions other than malignant neoplasm: Secondary | ICD-10-CM | POA: Insufficient documentation

## 2011-11-05 ENCOUNTER — Ambulatory Visit (HOSPITAL_COMMUNITY)
Admission: RE | Admit: 2011-11-05 | Discharge: 2011-11-05 | Disposition: A | Discharge status: Home / Self Care | Payer: Medicare (Managed Care) | Source: Ambulatory Visit | Attending: Internal Medicine | Admitting: Internal Medicine

## 2011-11-05 DIAGNOSIS — Z1231 Encounter for screening mammogram for malignant neoplasm of breast: Secondary | ICD-10-CM | POA: Diagnosis not present

## 2011-11-05 DIAGNOSIS — Z139 Encounter for screening, unspecified: Secondary | ICD-10-CM

## 2011-12-19 DIAGNOSIS — I2510 Atherosclerotic heart disease of native coronary artery without angina pectoris: Secondary | ICD-10-CM | POA: Diagnosis not present

## 2011-12-19 DIAGNOSIS — D649 Anemia, unspecified: Secondary | ICD-10-CM | POA: Diagnosis not present

## 2011-12-19 DIAGNOSIS — I10 Essential (primary) hypertension: Secondary | ICD-10-CM | POA: Diagnosis not present

## 2012-03-19 DIAGNOSIS — I2510 Atherosclerotic heart disease of native coronary artery without angina pectoris: Secondary | ICD-10-CM | POA: Diagnosis not present

## 2012-03-19 DIAGNOSIS — E7800 Pure hypercholesterolemia, unspecified: Secondary | ICD-10-CM | POA: Diagnosis not present

## 2012-07-02 DIAGNOSIS — E7800 Pure hypercholesterolemia, unspecified: Secondary | ICD-10-CM | POA: Diagnosis not present

## 2012-07-02 DIAGNOSIS — I2510 Atherosclerotic heart disease of native coronary artery without angina pectoris: Secondary | ICD-10-CM | POA: Diagnosis not present

## 2012-07-02 DIAGNOSIS — M549 Dorsalgia, unspecified: Secondary | ICD-10-CM | POA: Diagnosis not present

## 2012-09-08 DIAGNOSIS — I2510 Atherosclerotic heart disease of native coronary artery without angina pectoris: Secondary | ICD-10-CM | POA: Diagnosis not present

## 2012-09-08 DIAGNOSIS — E785 Hyperlipidemia, unspecified: Secondary | ICD-10-CM | POA: Diagnosis not present

## 2012-09-08 DIAGNOSIS — Z951 Presence of aortocoronary bypass graft: Secondary | ICD-10-CM | POA: Diagnosis not present

## 2012-09-08 DIAGNOSIS — Z9861 Coronary angioplasty status: Secondary | ICD-10-CM | POA: Diagnosis not present

## 2012-09-08 DIAGNOSIS — R071 Chest pain on breathing: Secondary | ICD-10-CM | POA: Diagnosis not present

## 2012-09-08 DIAGNOSIS — I10 Essential (primary) hypertension: Secondary | ICD-10-CM | POA: Diagnosis not present

## 2012-09-08 DIAGNOSIS — R079 Chest pain, unspecified: Secondary | ICD-10-CM | POA: Diagnosis not present

## 2012-09-08 DIAGNOSIS — Z87891 Personal history of nicotine dependence: Secondary | ICD-10-CM | POA: Diagnosis not present

## 2012-09-08 DIAGNOSIS — R5381 Other malaise: Secondary | ICD-10-CM | POA: Diagnosis not present

## 2012-09-08 DIAGNOSIS — R0602 Shortness of breath: Secondary | ICD-10-CM | POA: Diagnosis not present

## 2012-09-08 DIAGNOSIS — F411 Generalized anxiety disorder: Secondary | ICD-10-CM | POA: Diagnosis not present

## 2012-09-09 DIAGNOSIS — R079 Chest pain, unspecified: Secondary | ICD-10-CM | POA: Diagnosis not present

## 2012-09-09 DIAGNOSIS — I2510 Atherosclerotic heart disease of native coronary artery without angina pectoris: Secondary | ICD-10-CM | POA: Diagnosis not present

## 2012-09-25 DIAGNOSIS — I10 Essential (primary) hypertension: Secondary | ICD-10-CM | POA: Diagnosis not present

## 2012-09-25 DIAGNOSIS — I2510 Atherosclerotic heart disease of native coronary artery without angina pectoris: Secondary | ICD-10-CM | POA: Diagnosis not present

## 2012-10-28 ENCOUNTER — Ambulatory Visit: Payer: Medicare Other | Admitting: Cardiovascular Disease

## 2012-10-29 ENCOUNTER — Other Ambulatory Visit (HOSPITAL_COMMUNITY): Payer: Self-pay | Admitting: Internal Medicine

## 2012-10-29 DIAGNOSIS — Z139 Encounter for screening, unspecified: Secondary | ICD-10-CM

## 2012-11-09 ENCOUNTER — Ambulatory Visit (HOSPITAL_COMMUNITY)
Admission: RE | Admit: 2012-11-09 | Discharge: 2012-11-09 | Disposition: A | Discharge status: Home / Self Care | Payer: Medicare Other | Source: Ambulatory Visit | Attending: Internal Medicine | Admitting: Internal Medicine

## 2012-11-09 DIAGNOSIS — Z1231 Encounter for screening mammogram for malignant neoplasm of breast: Secondary | ICD-10-CM | POA: Diagnosis not present

## 2012-11-09 DIAGNOSIS — Z139 Encounter for screening, unspecified: Secondary | ICD-10-CM

## 2012-12-07 DIAGNOSIS — M545 Low back pain: Secondary | ICD-10-CM | POA: Diagnosis not present

## 2012-12-07 DIAGNOSIS — Z8673 Personal history of transient ischemic attack (TIA), and cerebral infarction without residual deficits: Secondary | ICD-10-CM | POA: Diagnosis not present

## 2012-12-07 DIAGNOSIS — I10 Essential (primary) hypertension: Secondary | ICD-10-CM | POA: Diagnosis not present

## 2012-12-24 DIAGNOSIS — I10 Essential (primary) hypertension: Secondary | ICD-10-CM | POA: Diagnosis not present

## 2012-12-24 DIAGNOSIS — R7301 Impaired fasting glucose: Secondary | ICD-10-CM | POA: Diagnosis not present

## 2012-12-24 DIAGNOSIS — E7800 Pure hypercholesterolemia, unspecified: Secondary | ICD-10-CM | POA: Diagnosis not present

## 2012-12-24 DIAGNOSIS — I2510 Atherosclerotic heart disease of native coronary artery without angina pectoris: Secondary | ICD-10-CM | POA: Diagnosis not present

## 2013-03-25 DIAGNOSIS — E7800 Pure hypercholesterolemia, unspecified: Secondary | ICD-10-CM | POA: Diagnosis not present

## 2013-03-25 DIAGNOSIS — E669 Obesity, unspecified: Secondary | ICD-10-CM | POA: Diagnosis not present

## 2013-03-25 DIAGNOSIS — I10 Essential (primary) hypertension: Secondary | ICD-10-CM | POA: Diagnosis not present

## 2013-06-17 ENCOUNTER — Encounter (HOSPITAL_COMMUNITY): Payer: Self-pay | Admitting: Emergency Medicine

## 2013-06-17 ENCOUNTER — Inpatient Hospital Stay (HOSPITAL_COMMUNITY)
Admission: EM | Admit: 2013-06-17 | Discharge: 2013-06-24 | DRG: 192 | Disposition: A | Discharge status: Home / Self Care | Payer: Medicare Other | Attending: Internal Medicine | Admitting: Internal Medicine

## 2013-06-17 ENCOUNTER — Emergency Department (HOSPITAL_COMMUNITY): Payer: Medicare Other

## 2013-06-17 DIAGNOSIS — J441 Chronic obstructive pulmonary disease with (acute) exacerbation: Secondary | ICD-10-CM | POA: Diagnosis not present

## 2013-06-17 DIAGNOSIS — E669 Obesity, unspecified: Secondary | ICD-10-CM | POA: Diagnosis present

## 2013-06-17 DIAGNOSIS — K921 Melena: Secondary | ICD-10-CM | POA: Diagnosis not present

## 2013-06-17 DIAGNOSIS — Z800 Family history of malignant neoplasm of digestive organs: Secondary | ICD-10-CM

## 2013-06-17 DIAGNOSIS — Z951 Presence of aortocoronary bypass graft: Secondary | ICD-10-CM | POA: Diagnosis not present

## 2013-06-17 DIAGNOSIS — Z6835 Body mass index (BMI) 35.0-35.9, adult: Secondary | ICD-10-CM

## 2013-06-17 DIAGNOSIS — K2970 Gastritis, unspecified, without bleeding: Secondary | ICD-10-CM | POA: Diagnosis not present

## 2013-06-17 DIAGNOSIS — Z87442 Personal history of urinary calculi: Secondary | ICD-10-CM | POA: Diagnosis not present

## 2013-06-17 DIAGNOSIS — M545 Low back pain, unspecified: Secondary | ICD-10-CM | POA: Diagnosis present

## 2013-06-17 DIAGNOSIS — I10 Essential (primary) hypertension: Secondary | ICD-10-CM | POA: Diagnosis not present

## 2013-06-17 DIAGNOSIS — K449 Diaphragmatic hernia without obstruction or gangrene: Secondary | ICD-10-CM | POA: Diagnosis not present

## 2013-06-17 DIAGNOSIS — K5900 Constipation, unspecified: Secondary | ICD-10-CM | POA: Diagnosis not present

## 2013-06-17 DIAGNOSIS — Z981 Arthrodesis status: Secondary | ICD-10-CM | POA: Diagnosis not present

## 2013-06-17 DIAGNOSIS — R9431 Abnormal electrocardiogram [ECG] [EKG]: Secondary | ICD-10-CM

## 2013-06-17 DIAGNOSIS — Z87891 Personal history of nicotine dependence: Secondary | ICD-10-CM | POA: Diagnosis not present

## 2013-06-17 DIAGNOSIS — J159 Unspecified bacterial pneumonia: Secondary | ICD-10-CM | POA: Diagnosis not present

## 2013-06-17 DIAGNOSIS — Z9861 Coronary angioplasty status: Secondary | ICD-10-CM

## 2013-06-17 DIAGNOSIS — K222 Esophageal obstruction: Secondary | ICD-10-CM | POA: Diagnosis present

## 2013-06-17 DIAGNOSIS — Z803 Family history of malignant neoplasm of breast: Secondary | ICD-10-CM

## 2013-06-17 DIAGNOSIS — J449 Chronic obstructive pulmonary disease, unspecified: Secondary | ICD-10-CM | POA: Diagnosis not present

## 2013-06-17 DIAGNOSIS — Z7982 Long term (current) use of aspirin: Secondary | ICD-10-CM

## 2013-06-17 DIAGNOSIS — Z808 Family history of malignant neoplasm of other organs or systems: Secondary | ICD-10-CM | POA: Diagnosis not present

## 2013-06-17 DIAGNOSIS — K2990 Gastroduodenitis, unspecified, without bleeding: Secondary | ICD-10-CM | POA: Diagnosis not present

## 2013-06-17 DIAGNOSIS — R062 Wheezing: Secondary | ICD-10-CM | POA: Diagnosis not present

## 2013-06-17 DIAGNOSIS — I2510 Atherosclerotic heart disease of native coronary artery without angina pectoris: Secondary | ICD-10-CM | POA: Diagnosis present

## 2013-06-17 DIAGNOSIS — K922 Gastrointestinal hemorrhage, unspecified: Secondary | ICD-10-CM | POA: Diagnosis not present

## 2013-06-17 DIAGNOSIS — G8929 Other chronic pain: Secondary | ICD-10-CM | POA: Diagnosis present

## 2013-06-17 DIAGNOSIS — R933 Abnormal findings on diagnostic imaging of other parts of digestive tract: Secondary | ICD-10-CM | POA: Diagnosis not present

## 2013-06-17 DIAGNOSIS — R0602 Shortness of breath: Secondary | ICD-10-CM | POA: Diagnosis not present

## 2013-06-17 DIAGNOSIS — K5289 Other specified noninfective gastroenteritis and colitis: Secondary | ICD-10-CM | POA: Diagnosis not present

## 2013-06-17 DIAGNOSIS — R195 Other fecal abnormalities: Secondary | ICD-10-CM | POA: Diagnosis not present

## 2013-06-17 HISTORY — DX: Diaphragmatic hernia without obstruction or gangrene: K44.9

## 2013-06-17 HISTORY — DX: Chronic obstructive pulmonary disease, unspecified: J44.9

## 2013-06-17 HISTORY — DX: Diverticulosis of intestine, part unspecified, without perforation or abscess without bleeding: K57.90

## 2013-06-17 HISTORY — DX: Other chronic pain: G89.29

## 2013-06-17 HISTORY — DX: Other nonspecific abnormal finding of lung field: R91.8

## 2013-06-17 HISTORY — DX: Other chronic pain: M54.9

## 2013-06-17 LAB — BASIC METABOLIC PANEL
BUN: 21 mg/dL (ref 6–23)
CALCIUM: 9.3 mg/dL (ref 8.4–10.5)
CO2: 25 mEq/L (ref 19–32)
Chloride: 101 mEq/L (ref 96–112)
Creatinine, Ser: 0.96 mg/dL (ref 0.50–1.10)
GFR calc Af Amer: 68 mL/min — ABNORMAL LOW (ref >90)
GFR, EST NON AFRICAN AMERICAN: 59 mL/min — AB (ref >90)
GLUCOSE: 113 mg/dL — AB (ref 70–99)
Potassium: 3.8 mEq/L (ref 3.7–5.3)
SODIUM: 140 meq/L (ref 137–147)

## 2013-06-17 LAB — CBC WITH DIFFERENTIAL/PLATELET
BASOS PCT: 1 % (ref 0–1)
Basophils Absolute: 0 10*3/uL (ref 0.0–0.1)
EOS ABS: 0.3 10*3/uL (ref 0.0–0.7)
EOS PCT: 4 % (ref 0–5)
HCT: 40.2 % (ref 36.0–46.0)
Hemoglobin: 13.2 g/dL (ref 12.0–15.0)
LYMPHS ABS: 2.1 10*3/uL (ref 0.7–4.0)
Lymphocytes Relative: 31 % (ref 12–46)
MCH: 27.2 pg (ref 26.0–34.0)
MCHC: 32.8 g/dL (ref 30.0–36.0)
MCV: 82.9 fL (ref 78.0–100.0)
Monocytes Absolute: 0.6 10*3/uL (ref 0.1–1.0)
Monocytes Relative: 8 % (ref 3–12)
Neutro Abs: 3.9 10*3/uL (ref 1.7–7.7)
Neutrophils Relative %: 57 % (ref 43–77)
PLATELETS: 268 10*3/uL (ref 150–400)
RBC: 4.85 MIL/uL (ref 3.87–5.11)
RDW: 15.3 % (ref 11.5–15.5)
WBC: 6.9 10*3/uL (ref 4.0–10.5)

## 2013-06-17 LAB — INFLUENZA PANEL BY PCR (TYPE A & B)
H1N1FLUPCR: NOT DETECTED
INFLAPCR: NEGATIVE
INFLBPCR: NEGATIVE

## 2013-06-17 LAB — LACTIC ACID, PLASMA: LACTIC ACID, VENOUS: 1.9 mmol/L (ref 0.5–2.2)

## 2013-06-17 LAB — TROPONIN I: Troponin I: <0.3 ng/mL (ref <0.30)

## 2013-06-17 MED ORDER — ALBUTEROL SULFATE (2.5 MG/3ML) 0.083% IN NEBU
2.5000 mg | INHALATION_SOLUTION | RESPIRATORY_TRACT | Status: DC
  Filled 2013-06-17: qty 3

## 2013-06-17 MED ORDER — LEVOFLOXACIN IN D5W 500 MG/100ML IV SOLN: 500.0000 mg | INTRAVENOUS | Status: DC

## 2013-06-17 MED ORDER — METHYLPREDNISOLONE SODIUM SUCC 125 MG IJ SOLR
125.0000 mg | Freq: Once | INTRAMUSCULAR | Status: AC
  Administered 2013-06-17: 125 mg via INTRAVENOUS
  Filled 2013-06-17: qty 2

## 2013-06-17 MED ORDER — ATORVASTATIN CALCIUM 40 MG PO TABS
40.0000 mg | ORAL_TABLET | Freq: Every evening | ORAL | Status: DC
  Administered 2013-06-17 – 2013-06-23 (×7): 40 mg via ORAL
  Filled 2013-06-17 (×7): qty 1

## 2013-06-17 MED ORDER — METHYLPREDNISOLONE SODIUM SUCC 125 MG IJ SOLR
125.0000 mg | Freq: Four times a day (QID) | INTRAMUSCULAR | Status: DC
Start: 2013-06-17 — End: 2013-06-20
  Administered 2013-06-17 – 2013-06-20 (×11): 125 mg via INTRAVENOUS
  Filled 2013-06-17 (×11): qty 2

## 2013-06-17 MED ORDER — ENOXAPARIN SODIUM 60 MG/0.6ML ~~LOC~~ SOLN
50.0000 mg | SUBCUTANEOUS | Status: DC
  Administered 2013-06-17 – 2013-06-21 (×5): 50 mg via SUBCUTANEOUS
  Filled 2013-06-17 (×6): qty 0.6

## 2013-06-17 MED ORDER — ALBUTEROL (5 MG/ML) CONTINUOUS INHALATION SOLN
10.0000 mg/h | INHALATION_SOLUTION | Freq: Once | RESPIRATORY_TRACT | Status: AC
Start: 2013-06-17 — End: 2013-06-17
  Administered 2013-06-17: 10 mg/h via RESPIRATORY_TRACT
  Filled 2013-06-17: qty 20

## 2013-06-17 MED ORDER — IPRATROPIUM BROMIDE 0.02 % IN SOLN
0.5000 mg | RESPIRATORY_TRACT | Status: DC | PRN
  Administered 2013-06-19: 0.5 mg via RESPIRATORY_TRACT
  Filled 2013-06-17: qty 2.5

## 2013-06-17 MED ORDER — METOPROLOL SUCCINATE ER 25 MG PO TB24
25.0000 mg | ORAL_TABLET | Freq: Every day | ORAL | Status: DC
  Administered 2013-06-17 – 2013-06-24 (×8): 25 mg via ORAL
  Filled 2013-06-17 (×8): qty 1

## 2013-06-17 MED ORDER — ASPIRIN 81 MG PO CHEW
160.0000 mg | CHEWABLE_TABLET | Freq: Every day | ORAL | Status: DC
  Administered 2013-06-17 – 2013-06-22 (×6): 162 mg via ORAL
  Filled 2013-06-17 (×6): qty 2

## 2013-06-17 MED ORDER — LEVOFLOXACIN IN D5W 750 MG/150ML IV SOLN
750.0000 mg | INTRAVENOUS | Status: DC
  Administered 2013-06-17: 750 mg via INTRAVENOUS
  Filled 2013-06-17: qty 150

## 2013-06-17 MED ORDER — LISINOPRIL 10 MG PO TABS
10.0000 mg | ORAL_TABLET | Freq: Every day | ORAL | Status: DC
  Administered 2013-06-17 – 2013-06-24 (×8): 10 mg via ORAL
  Filled 2013-06-17 (×8): qty 1

## 2013-06-17 MED ORDER — VITAMIN C 500 MG PO TABS
2000.0000 mg | ORAL_TABLET | Freq: Every day | ORAL | Status: DC
  Administered 2013-06-18 – 2013-06-24 (×7): 2000 mg via ORAL
  Filled 2013-06-17 (×7): qty 4

## 2013-06-17 MED ORDER — IPRATROPIUM BROMIDE 0.02 % IN SOLN
1.0000 mg | Freq: Once | RESPIRATORY_TRACT | Status: AC
  Administered 2013-06-17: 1 mg via RESPIRATORY_TRACT
  Filled 2013-06-17: qty 5

## 2013-06-17 MED ORDER — VITAMIN D 1000 UNITS PO TABS
1000.0000 [IU] | ORAL_TABLET | Freq: Every day | ORAL | Status: DC
  Administered 2013-06-18 – 2013-06-24 (×7): 1000 [IU] via ORAL
  Filled 2013-06-17 (×7): qty 1

## 2013-06-17 MED ORDER — ALBUTEROL SULFATE (2.5 MG/3ML) 0.083% IN NEBU
2.5000 mg | INHALATION_SOLUTION | RESPIRATORY_TRACT | Status: DC
  Administered 2013-06-17: 2.5 mg via RESPIRATORY_TRACT
  Filled 2013-06-17 (×2): qty 3

## 2013-06-17 NOTE — ED Notes (Signed)
Pt stated she was hungry, meal tray given per md request.

## 2013-06-17 NOTE — ED Notes (Signed)
Ambulated patient in the hallway.  Patient's o2 sat remained between 99-100%.  Patient not in stress while walking.

## 2013-06-17 NOTE — ED Provider Notes (Signed)
CSN: [id number]     Arrival date & time 06/17/13  1017 History   First MD Initiated Contact with Patient 06/17/13 1035     Chief Complaint  Patient presents with  . Shortness of Breath    HPI Pt was seen at 1040.  Per pt, c/o gradual onset and worsening of persistent cough, wheezing and SOB for the past 3 weeks, worse over the past 3 days. Describes her cough as productive of "yellow" sputum. Has been associated with chills and generalized fatigue. Pt was evaluated by her PMD PTA, then sent to the ED for further evaluation. Denies CP/palpitations, no back pain, no abd pain, no N/V/D, no fevers, no rash.     Past Medical History  Diagnosis Date  . Hypertension   . GI bleed Nov 2012  . Colitis, ischemic 03/2011  . S/P colonoscopy     over 10 yrs ago-normal  . Kidney stone   . Gallstones   . Multiple pulmonary nodules 03/2011  . Chronic back pain   . COPD (chronic obstructive pulmonary disease)   . Hiatal hernia   . Diverticulosis    Past Surgical History  Procedure Laterality Date  . Spinal fusion      x3  . Salpingoophorectomy      left  . Coronary stent placement  1995  . Coronary artery bypass graft  1998  . Colonoscopy  04/16/2011    Procedure: COLONOSCOPY;  Surgeon: Arlyce Harman, MD;  rare areas of ulceration and erythema sigmoid colon to distal trv colon with frequent diverticula in this area as well. Appeared most c/w ischemic colitis resolving. Bx colitis  . Abdominal hysterectomy    . Small intestine surgery     Family History  Problem Relation Age of Onset  . Stomach cancer Mother 20    ? believes it was stomach  . Bone cancer Sister 102  . Stomach cancer Sister 78  . Colon cancer Neg Hx    History  Substance Use Topics  . Smoking status: Former Smoker -- 1.00 packs/day for 40 years    Types: Cigarettes    Quit date: 09/14/2010  . Smokeless tobacco: Not on file  . Alcohol Use: Yes     Comment: couple drinks/yr    Review of Systems. ROS: Statement:  All systems negative except as marked or noted in the HPI; Constitutional: Negative for fever and +chills, generalized fatigue. ; ; Eyes: Negative for eye pain, redness and discharge. ; ; ENMT: Negative for ear pain, hoarseness, nasal congestion, sinus pressure and sore throat. ; ; Cardiovascular: Negative for chest pain, palpitations, diaphoresis, and peripheral edema. ; ; Respiratory: +cough, wheezing, SOB. Negative for stridor. ; ; Gastrointestinal: Negative for nausea, vomiting, diarrhea, abdominal pain, blood in stool, hematemesis, jaundice and rectal bleeding. . ; ; Genitourinary: Negative for dysuria, flank pain and hematuria. ; ; Musculoskeletal: Negative for back pain and neck pain. Negative for swelling and trauma.; ; Skin: Negative for pruritus, rash, abrasions, blisters, bruising and skin lesion.; ; Neuro: Negative for headache, lightheadedness and neck stiffness. Negative for weakness, altered level of consciousness , altered mental status, extremity weakness, paresthesias, involuntary movement, seizure and syncope.      Allergies  Omnipaque and Penicillins  Home Medications   Current Outpatient Rx  Name  Route  Sig  Dispense  Refill  . Ascorbic Acid (VITAMIN C) 1000 MG tablet   Oral   Take 2,000 mg by mouth daily.         Marland Kitchen  aspirin 81 MG tablet   Oral   Take 160 mg by mouth daily.           Marland Kitchen. atorvastatin (LIPITOR) 40 MG tablet   Oral   Take 1 tablet by mouth every evening.         . cholecalciferol (VITAMIN D) 1000 UNITS tablet   Oral   Take 1,000 Units by mouth daily.           Marland Kitchen. lisinopril (PRINIVIL,ZESTRIL) 10 MG tablet   Oral   Take 10 mg by mouth daily.           . metoprolol succinate (TOPROL-XL) 25 MG 24 hr tablet   Oral   Take 25 mg by mouth daily.            BP 145/77  Pulse 76  Temp(Src) 97.8 F (36.6 C) (Oral)  Resp 24  Ht 5\' 3"  (1.6 m)  Wt 202 lb (91.627 kg)  BMI 35.79 kg/m2  SpO2 100% Physical Exam 1045: Physical examination:   Nursing notes reviewed; Vital signs and O2 SAT reviewed;  Constitutional: Well developed, Well nourished, In no acute distress; Head:  Normocephalic, atraumatic; Eyes: EOMI, PERRL, No scleral icterus; ENMT: TM's clear bilat. +edemetous nasal turbinates bilat with clear rhinorrhea. Mouth and pharynx without lesions. No tonsillar exudates. No intra-oral edema. No submandibular or sublingual edema. No hoarse voice, no drooling, no stridor. No pain with manipulation of larynx.  Mouth and pharynx normal, Mucous membranes dry; Neck: Supple, Full range of motion, No lymphadenopathy; Cardiovascular: Regular rate and rhythm, No gallop; Respiratory: Breath sounds diminished & equal bilaterally, insp/exp wheezes bilat with audible wheezing. Moist cough during exam. Speaking in phrases. Tachypneic.; Chest: Nontender, Movement normal;  Abdomen: Soft, Nontender, Nondistended, Normal bowel sounds; Genitourinary: No CVA tenderness; Extremities: Pulses normal, No tenderness, No edema, No calf edema or asymmetry.; Neuro: AA&Ox3, Major CN grossly intact.  Speech clear. No gross focal motor or sensory deficits in extremities.; Skin: Color normal, Warm, Dry.   ED Course  Procedures      EKG Interpretation    Date/Time:  Thursday June 17 2013 10:53:37 EST Ventricular Rate:  61 PR Interval:  142 QRS Duration: 90 QT Interval:  416 QTC Calculation: 418 R Axis:   14 Text Interpretation:  Normal sinus rhythm T wave abnormality, consider inferior ischemia T wave abnormality, consider anterolateral ischemia Abnormal ECG No previous ECGs available Confirmed by [facility] Hospital[facility]CCMANUS  MD, Nicholos JohnsKATHLEEN [phone]([id number]) on 06/17/2013 1:01:18 PM            MDM  MDM Reviewed: previous chart, nursing note and vitals Reviewed previous: labs Interpretation: labs, ECG and x-ray Total time providing critical care: 30-74 minutes. This excludes time spent performing separately reportable procedures and services. Consults: admitting  MD     CRITICAL CARE Performed by: Laray AngerMCMANUS,Tameca Jerez M Total critical care time: 35 Critical care time was exclusive of separately billable procedures and treating other patients. Critical care was necessary to treat or prevent imminent or life-threatening deterioration. Critical care was time spent personally by me on the following activities: development of treatment plan with patient and/or surrogate as well as nursing, discussions with consultants, evaluation of patient's response to treatment, examination of patient, obtaining history from patient or surrogate, ordering and performing treatments and interventions, ordering and review of laboratory studies, ordering and review of radiographic studies, pulse oximetry and re-evaluation of patient's condition.   Results for orders placed during the hospital encounter of 06/17/13  CBC WITH DIFFERENTIAL  Result Value Range   WBC 6.9  4.0 - 10.5 K/uL   RBC 4.85  3.87 - 5.11 MIL/uL   Hemoglobin 13.2  12.0 - 15.0 g/dL   HCT 04.5  40.9 - 81.1 %   MCV 82.9  78.0 - 100.0 fL   MCH 27.2  26.0 - 34.0 pg   MCHC 32.8  30.0 - 36.0 g/dL   RDW 91.4  78.2 - 95.6 %   Platelets 268  150 - 400 K/uL   Neutrophils Relative % 57  43 - 77 %   Neutro Abs 3.9  1.7 - 7.7 K/uL   Lymphocytes Relative 31  12 - 46 %   Lymphs Abs 2.1  0.7 - 4.0 K/uL   Monocytes Relative 8  3 - 12 %   Monocytes Absolute 0.6  0.1 - 1.0 K/uL   Eosinophils Relative 4  0 - 5 %   Eosinophils Absolute 0.3  0.0 - 0.7 K/uL   Basophils Relative 1  0 - 1 %   Basophils Absolute 0.0  0.0 - 0.1 K/uL  BASIC METABOLIC PANEL      Result Value Range   Sodium 140  137 - 147 mEq/L   Potassium 3.8  3.7 - 5.3 mEq/L   Chloride 101  96 - 112 mEq/L   CO2 25  19 - 32 mEq/L   Glucose, Bld 113 (*) 70 - 99 mg/dL   BUN 21  6 - 23 mg/dL   Creatinine, Ser 2.13  0.50 - 1.10 mg/dL   Calcium 9.3  8.4 - 08.6 mg/dL   GFR calc non Af Amer 59 (*) >90 mL/min   GFR calc Af Amer 68 (*) >90 mL/min  TROPONIN  I      Result Value Range   Troponin I <0.30  <0.30 ng/mL  LACTIC ACID, PLASMA      Result Value Range   Lactic Acid, Venous 1.9  0.5 - 2.2 mmol/L   Dg Chest 2 View 06/17/2013   CLINICAL DATA:  Short of breath  EXAM: CHEST  2 VIEW  COMPARISON:  CT chest 11/01/2011  FINDINGS: Cardiac enlargement without heart failure. Lungs are clear without infiltrate or effusion.  IMPRESSION: No active cardiopulmonary disease.   Electronically Signed   By: Marlan Palau M.D.   On: 06/17/2013 13:28    1100: Pt with insp/exp wheezing on arrival, +audible wheezing:  Will start hour long neb while workup progresses.   1400:  Neb completed. Pt ambulated with Sats 99% R/A, but c/o increasing SOB. Upon arrival back to her stretcher, pt again began to have insp/exp wheezing bilat, with occasional audible wheezing. Will dose IV steroids and admit. Dx and testing d/w pt.  Questions answered.  Verb understanding, agreeable to observation admit.  1430:  T/C to Dr. Felecia Shelling, case discussed, including:  HPI, pertinent PM/SHx, VS/PE, dx testing, ED course and treatment:  Agreeable to observation admit, requests to write temporary orders, obtain tele bed.     Laray Anger, DO 06/20/13 1132

## 2013-06-17 NOTE — Progress Notes (Signed)
ANTIBIOTIC CONSULT NOTE - INITIAL  Pharmacy Consult for Levaquin Indication: COPDexacerbation  Allergies  Allergen Reactions  . Omnipaque [Iohexol]   . Penicillins    Patient Measurements: Height: 5\' 3"  (160 cm) Weight: 202 lb (91.627 kg) IBW/kg (Calculated) : 52.4  Vital Signs: Temp: 97.8 F (36.6 C) (01/22 1025) Temp src: Oral (01/22 1025) BP: 145/77 mmHg (01/22 1025) Pulse Rate: 76 (01/22 1025) Intake/Output from previous day:   Intake/Output from this shift:    Labs:  Recent Labs  06/17/13 1111  WBC 6.9  HGB 13.2  PLT 268  CREATININE 0.96   Estimated Creatinine Clearance: 59.5 ml/min (by C-G formula based on Cr of 0.96). No results found for this basename: VANCOTROUGH, VANCOPEAK, VANCORANDOM, GENTTROUGH, GENTPEAK, GENTRANDOM, TOBRATROUGH, TOBRAPEAK, TOBRARND, AMIKACINPEAK, AMIKACINTROU, AMIKACIN,  in the last 72 hours   Microbiology: No results found for this or any previous visit (from the past 720 hour(s)).  Medical History: Past Medical History  Diagnosis Date  . Hypertension   . GI bleed Nov 2012  . Colitis, ischemic 03/2011  . S/P colonoscopy     over 10 yrs ago-normal  . Kidney stone   . Gallstones   . Multiple pulmonary nodules 03/2011  . Chronic back pain   . COPD (chronic obstructive pulmonary disease)   . Hiatal hernia   . Diverticulosis    Medications:  Scheduled:  . methylPREDNISolone (SOLU-MEDROL) injection  125 mg Intravenous Once   Assessment: 68yo female with good renal fxn.  Estimated Creatinine Clearance: 59.5 ml/min (by C-G formula based on Cr of 0.96).  Levaquin 1/22 >>  Goal of Therapy:  Eradicate infection.  Plan:  Levaquin 750mg  IV q24hrs Switch to PO when indicated Monitor labs, renal fxn, and cultures  Valrie HartHall, Earnie Bechard A 06/17/2013,2:48 PM

## 2013-06-17 NOTE — Progress Notes (Signed)
Pt states that treatment had her feeling like her she was having a seizures and she was jurking for about 10 minutes. She afraid to take treatment again. I explained that its the same medicine she had in the ED. She complained about the her neck was hurting and that she called for help and no one came. I was not informed of this incident.

## 2013-06-17 NOTE — ED Notes (Signed)
Pt reports cold symptoms x 3 weeks.  Reports SOB has gotten worse over the past 3 days.  PT went to Dr. Letitia NeriFanta's office and was sent here for further eval.  Reports received nebulizer treatment in his office.  Pt reports productive cough with yellow sputum, soreness, and chills.

## 2013-06-17 NOTE — Progress Notes (Addendum)
ANTICOAGULATION CONSULT NOTE - Initial Consult  Pharmacy Consult for Lovenox Indication: VTE prophylaxis  Allergies  Allergen Reactions  . Omnipaque [Iohexol]   . Penicillins     Patient Measurements: Height: 5\' 3"  (160 cm) Weight: 222 lb 7.1 oz (100.9 kg) IBW/kg (Calculated) : 52.4  Vital Signs: Temp: 98.1 F (36.7 C) (01/22 1842) Temp src: Oral (01/22 1842) BP: 129/60 mmHg (01/22 1842) Pulse Rate: 90 (01/22 1842)  Labs:  Recent Labs  06/17/13 1111  HGB 13.2  HCT 40.2  PLT 268  CREATININE 0.96  TROPONINI <0.30    Estimated Creatinine Clearance: 62.7 ml/min (by C-G formula based on Cr of 0.96).   Medical History: Past Medical History  Diagnosis Date  . Hypertension   . GI bleed Nov 2012  . Colitis, ischemic 03/2011  . S/P colonoscopy     over 10 yrs ago-normal  . Kidney stone   . Gallstones   . Multiple pulmonary nodules 03/2011  . Chronic back pain   . COPD (chronic obstructive pulmonary disease)   . Hiatal hernia   . Diverticulosis     Medications:  Scheduled:  . [START ON 06/18/2013] albuterol  2.5 mg Nebulization Q4H WA  . aspirin  162 mg Oral Daily  . atorvastatin  40 mg Oral QPM  . [START ON 06/18/2013] cholecalciferol  1,000 Units Oral Daily  . enoxaparin (LOVENOX) injection  50 mg Subcutaneous Q24H  . [START ON 06/18/2013] levofloxacin (LEVAQUIN) IV  500 mg Intravenous Q24H  . lisinopril  10 mg Oral Daily  . methylPREDNISolone (SOLU-MEDROL) injection  125 mg Intravenous Q6H  . metoprolol succinate  25 mg Oral Daily  . [START ON 06/18/2013] vitamin C  2,000 mg Oral Daily    Assessment: Lovenox for VTE px in obese female.  Renal function is good/at patient's baseline. No bleeding noted.  CBC reviewed.  Goal of Therapy:  Monitor platelets by anticoagulation protocol: Yes   Plan:  Lovenox 50mg  sq daily (0.5mg /kg/day) Pharmacy to sign off.  Please re-consult if needed.   Elson ClanLilliston, Iyari Hagner Michelle 06/17/2013,9:47 PM

## 2013-06-17 NOTE — ED Notes (Signed)
Pt tolerated meal tray well. Awaiting admission.

## 2013-06-18 DIAGNOSIS — K5289 Other specified noninfective gastroenteritis and colitis: Secondary | ICD-10-CM | POA: Diagnosis not present

## 2013-06-18 DIAGNOSIS — J449 Chronic obstructive pulmonary disease, unspecified: Secondary | ICD-10-CM | POA: Diagnosis not present

## 2013-06-18 DIAGNOSIS — K922 Gastrointestinal hemorrhage, unspecified: Secondary | ICD-10-CM | POA: Diagnosis not present

## 2013-06-18 DIAGNOSIS — J159 Unspecified bacterial pneumonia: Secondary | ICD-10-CM | POA: Diagnosis not present

## 2013-06-18 DIAGNOSIS — I10 Essential (primary) hypertension: Secondary | ICD-10-CM | POA: Diagnosis not present

## 2013-06-18 LAB — URINALYSIS W MICROSCOPIC + REFLEX CULTURE
BILIRUBIN URINE: NEGATIVE
Glucose, UA: 500 mg/dL — AB
HGB URINE DIPSTICK: NEGATIVE
KETONES UR: NEGATIVE mg/dL
Leukocytes, UA: NEGATIVE
Nitrite: NEGATIVE
PROTEIN: NEGATIVE mg/dL
SPECIFIC GRAVITY, URINE: 1.02 (ref 1.005–1.030)
UROBILINOGEN UA: 0.2 mg/dL (ref 0.0–1.0)
pH: 5 (ref 5.0–8.0)

## 2013-06-18 MED ORDER — LEVOFLOXACIN 750 MG PO TABS
750.0000 mg | ORAL_TABLET | Freq: Every day | ORAL | Status: DC
  Administered 2013-06-19 – 2013-06-24 (×6): 750 mg via ORAL
  Filled 2013-06-18 (×6): qty 1

## 2013-06-18 MED ORDER — POLYETHYLENE GLYCOL 3350 17 G PO PACK
17.0000 g | PACK | Freq: Every day | ORAL | Status: DC | PRN
  Administered 2013-06-18 – 2013-06-22 (×3): 17 g via ORAL
  Filled 2013-06-18 (×3): qty 1

## 2013-06-18 MED ORDER — ACETAMINOPHEN 325 MG PO TABS
650.0000 mg | ORAL_TABLET | Freq: Four times a day (QID) | ORAL | Status: DC | PRN
  Administered 2013-06-18 – 2013-06-19 (×2): 650 mg via ORAL
  Filled 2013-06-18 (×2): qty 2

## 2013-06-18 MED ORDER — GUAIFENESIN ER 600 MG PO TB12
1200.0000 mg | ORAL_TABLET | Freq: Two times a day (BID) | ORAL | Status: DC
  Administered 2013-06-18 – 2013-06-24 (×13): 1200 mg via ORAL
  Filled 2013-06-18 (×13): qty 2

## 2013-06-18 MED ORDER — LEVOFLOXACIN IN D5W 750 MG/150ML IV SOLN
750.0000 mg | INTRAVENOUS | Status: AC
  Administered 2013-06-18: 750 mg via INTRAVENOUS
  Filled 2013-06-18: qty 150

## 2013-06-18 MED ORDER — LEVALBUTEROL HCL 0.63 MG/3ML IN NEBU
0.6300 mg | INHALATION_SOLUTION | Freq: Four times a day (QID) | RESPIRATORY_TRACT | Status: DC
  Administered 2013-06-18 – 2013-06-23 (×20): 0.63 mg via RESPIRATORY_TRACT
  Filled 2013-06-18 (×22): qty 3

## 2013-06-18 NOTE — Care Management Note (Addendum)
    Page 1 of 1   06/21/2013     2:18:16 PM   CARE MANAGEMENT NOTE 06/21/2013  Patient:  [name],[name]   Account Number:  [id number]  Date Initiated:  06/18/2013  Documentation initiated by:  Rosemary HolmsOBSON,Keosha Rossa  Subjective/Objective Assessment:   Pt admitted from home where she lives alone. She states she is independent with ADL and her cousins assist her with transportation or groceries. Declined HH or DME needs.     Action/Plan:   Anticipated DC Date:  06/20/2013   Anticipated DC Plan:  HOME/SELF CARE      DC Planning Services  CM consult      Choice offered to / List presented to:             Status of service:  Completed, signed off Medicare Important Message given?   (If response is "NO", the following Medicare IM given date fields will be blank) Date Medicare IM given:   Date Additional Medicare IM given:    Discharge Disposition:    Per UR Regulation:    If discussed at Long Length of Stay Meetings, dates discussed:    Comments:  06/21/13 Rosemary HolmsAmy Karlynn Furrow RN BSN CM Pt unsure about DC. Concerned over dark BM she had.  06/18/13 Jastin Fore Leanord Hawkingobson RN BSN CM

## 2013-06-18 NOTE — Progress Notes (Signed)
ANTIBIOTIC CONSULT NOTE - FOLLOW UP  Pharmacy Consult for Levaquin Indication: COPDexacerbation  Allergies  Allergen Reactions  . Omnipaque [Iohexol]   . Penicillins    Patient Measurements: Height: 5\' 3"  (160 cm) Weight: 222 lb 7.1 oz (100.9 kg) IBW/kg (Calculated) : 52.4  Vital Signs: Temp: 97.8 F (36.6 C) (01/23 0555) Temp src: Oral (01/23 0555) BP: 124/61 mmHg (01/23 0555) Pulse Rate: 80 (01/23 0555) Intake/Output from previous day:   Intake/Output from this shift:    Labs:  Recent Labs  06/17/13 1111  WBC 6.9  HGB 13.2  PLT 268  CREATININE 0.96   Estimated Creatinine Clearance: 62.7 ml/min (by C-G formula based on Cr of 0.96). No results found for this basename: VANCOTROUGH, VANCOPEAK, VANCORANDOM, GENTTROUGH, GENTPEAK, GENTRANDOM, TOBRATROUGH, TOBRAPEAK, TOBRARND, AMIKACINPEAK, AMIKACINTROU, AMIKACIN,  in the last 72 hours   Microbiology: No results found for this or any previous visit (from the past 720 hour(s)).  Medical History: Past Medical History  Diagnosis Date  . Hypertension   . GI bleed Nov 2012  . Colitis, ischemic 03/2011  . S/P colonoscopy     over 10 yrs ago-normal  . Kidney stone   . Gallstones   . Multiple pulmonary nodules 03/2011  . Chronic back pain   . COPD (chronic obstructive pulmonary disease)   . Hiatal hernia   . Diverticulosis    Medications:  Scheduled:  . aspirin  162 mg Oral Daily  . atorvastatin  40 mg Oral QPM  . cholecalciferol  1,000 Units Oral Daily  . enoxaparin (LOVENOX) injection  50 mg Subcutaneous Q24H  . guaiFENesin  1,200 mg Oral BID  . levalbuterol  0.63 mg Nebulization Q6H  . levofloxacin (LEVAQUIN) IV  750 mg Intravenous Q24H  . [START ON 06/19/2013] levofloxacin  750 mg Oral Daily  . lisinopril  10 mg Oral Daily  . methylPREDNISolone (SOLU-MEDROL) injection  125 mg Intravenous Q6H  . metoprolol succinate  25 mg Oral Daily  . vitamin C  2,000 mg Oral Daily   Assessment: 68yo female with good  renal fxn.  Estimated Creatinine Clearance: 62.7 ml/min (by C-G formula based on Cr of 0.96). Pt is afebrile.  No micro data available.  Levaquin 1/22 >>  Goal of Therapy:  Eradicate infection.  Plan:  Levaquin 750mg  IV today then switch to  Levaquin 750mg  PO daily starting tomorrow. Monitor labs, renal fxn, and cultures  Valrie HartHall, Zamani Crocker A 06/18/2013,9:01 AM

## 2013-06-18 NOTE — H&P (Signed)
[name] MRN: [id number] DOB/AGE: [birth year] 68 y.o. Primary Care Physician:Shylo Dillenbeck, MD Admit date: 06/17/2013 Chief Complaint:  Cough, wheezing and wheezing HPI:  This is a 68 years old female patient with history of multiple medical illnesses came to my office with the above complaint. Patient was given nebulizer in the office, however her symptom continue to persist. She was sent to [facility] where she was further evaluated and received a nebulizer treatment for about an hour. She didn't improve and  Continued to wheeze. Patient was started on IV steroid and IV antibiotics and admitted for further treatment. NO fever,cheest pain, nausea, vomiting, abdominal pain, dysuria,urgency or frequency of urination.  Past Medical History  Diagnosis Date  . Hypertension   . GI bleed Nov 2012  . Colitis, ischemic 03/2011  . S/P colonoscopy     over 10 yrs ago-normal  . Kidney stone   . Gallstones   . Multiple pulmonary nodules 03/2011  . Chronic back pain   . COPD (chronic obstructive pulmonary disease)   . Hiatal hernia   . Diverticulosis    Past Surgical History  Procedure Laterality Date  . Spinal fusion      x3  . Salpingoophorectomy      left  . Coronary stent placement  1995  . Coronary artery bypass graft  1998  . Colonoscopy  04/16/2011    Procedure: COLONOSCOPY;  Surgeon: Arlyce Harman, MD;  rare areas of ulceration and erythema sigmoid colon to distal trv colon with frequent diverticula in this area as well. Appeared most c/w ischemic colitis resolving. Bx colitis  . Abdominal hysterectomy    . Small intestine surgery          Family History  Problem Relation Age of Onset  . Stomach cancer Mother 67    ? believes it was stomach  . Bone cancer Sister 32  . Stomach cancer Sister 21  . Colon cancer Neg Hx     Social History:  reports that she quit smoking about 2 years ago. Her smoking use included Cigarettes. She has a 40 pack-year smoking history. She does not have any  smokeless tobacco history on file. She reports that she drinks alcohol. She reports that she does not use illicit drugs.   Allergies:  Allergies  Allergen Reactions  . Omnipaque [Iohexol]   . Penicillins     Medications Prior to Admission  Medication Sig Dispense Refill  . Ascorbic Acid (VITAMIN C) 1000 MG tablet Take 2,000 mg by mouth daily.      Marland Kitchen aspirin 81 MG tablet Take 160 mg by mouth daily.        Marland Kitchen atorvastatin (LIPITOR) 40 MG tablet Take 1 tablet by mouth every evening.      . cholecalciferol (VITAMIN D) 1000 UNITS tablet Take 1,000 Units by mouth daily.        Marland Kitchen lisinopril (PRINIVIL,ZESTRIL) 10 MG tablet Take 10 mg by mouth daily.        . metoprolol succinate (TOPROL-XL) 25 MG 24 hr tablet Take 25 mg by mouth daily.             WUJ:WJXBJ from the symptoms mentioned above,there are no other symptoms referable to all systems reviewed.  Physical Exam: Blood pressure 124/61, pulse 80, temperature 97.8 F (36.6 C), temperature source Oral, resp. rate 20, height 5\' 3"  (1.6 m), weight 100.9 kg (222 lb 7.1 oz), SpO2 94.00%. General Condition- alert, awake and sick looking HE ENT- pupils equal and reactive, neck  supple Respiratory- poor air entry, bilateral expiratory wheezes and rhonchi CVS- S1 and S2 heard, no murmur or gallop ABD- soft and lax, bowel sound++ EXT- no leg edema     Recent Labs  06/17/13 1111  WBC 6.9  NEUTROABS 3.9  HGB 13.2  HCT 40.2  MCV 82.9  PLT 268    Recent Labs  06/17/13 1111  NA 140  K 3.8  CL 101  CO2 25  GLUCOSE 113*  BUN 21  CREATININE 0.96  CALCIUM 9.3  lablast2(ast:2,ALT:2,alkphos:2,bilitot:2,prot:2,albumin:2)@    No results found for this or any previous visit (from the past 240 hour(s)).   Dg Chest 2 View  06/17/2013   CLINICAL DATA:  Short of breath  EXAM: CHEST  2 VIEW  COMPARISON:  CT chest 11/01/2011  FINDINGS: Cardiac enlargement without heart failure. Lungs are clear without infiltrate or effusion.  IMPRESSION:  No active cardiopulmonary disease.   Electronically Signed   By: Marlan Palauharles  Clark M.D.   On: 06/17/2013 13:28   Impression: 1.COPD Exacerbation 2. Hypertension 3.CAD 4. Obesity 5. H/O GI bleed 5. H/O ischemic colitis  Active Problems:   COPD with acute exacerbation     Plan: Medications reviewed Nebulizer treatment Continue solumedrol and IV steroid Pulmonary consult.      Sharra Cayabyab Pager [phone]  06/18/2013, 7:53 AM

## 2013-06-18 NOTE — Progress Notes (Signed)
Subjective: Patient was admitted yesterday due to COPD exacerbation. Patient complains of recurrent cough. She felt shaking and jerking when she took nebulizer treatment while she was here on the floor.   Objective: Vital signs in last 24 hours: Temp:  [97.8 F (36.6 C)-98.4 F (36.9 C)] 97.8 F (36.6 C) (01/23 0555) Pulse Rate:  [76-97] 80 (01/23 0555) Resp:  [20-24] 20 (01/23 0555) BP: (118-148)/(56-77) 124/61 mmHg (01/23 0555) SpO2:  [94 %-100 %] 94 % (01/23 0555) Weight:  [91.627 kg (202 lb)-100.9 kg (222 lb 7.1 oz)] 100.9 kg (222 lb 7.1 oz) (01/22 1842) Weight change:  Last BM Date: 06/15/13  Intake/Output from previous day:    PHYSICAL EXAM General appearance: alert and no distress Resp: diminished breath sounds bilaterally and wheezes bilaterally Cardio: S1, S2 normal GI: soft, non-tender; bowel sounds normal; no masses,  no organomegaly Extremities: extremities normal, atraumatic, no cyanosis or edema  Lab Results:    @labtest @ [facility] No results found for this basename: PHART, PCO2, PO2ART, TCO2, HCO3,  in the last 72 hours CULTURES No results found for this or any previous visit (from the past 240 hour(s)). Studies/Results: Dg Chest 2 View  06/17/2013   CLINICAL DATA:  Short of breath  EXAM: CHEST  2 VIEW  COMPARISON:  CT chest 11/01/2011  FINDINGS: Cardiac enlargement without heart failure. Lungs are clear without infiltrate or effusion.  IMPRESSION: No active cardiopulmonary disease.   Electronically Signed   By: Marlan Palauharles  Clark M.D.   On: 06/17/2013 13:28    Medications: I have reviewed the patient's current medications.  Assesment: 1.COPD Exacerbation 2. Hypertension 3.CAD 4. Obesity 5. H/O GI bleed 5. H/O ischemic colitis  Active Problems:   COPD with acute exacerbation    Plan: Medications reviewed Will D/C albuterol and start on xopenex nebulizer QID Continue IV steroid and Iv antibiotics Pulmonary cosult.    LOS: 1 day    Lana Flaim 06/18/2013, 8:09 AM

## 2013-06-18 NOTE — Progress Notes (Signed)
UR completed. Patient changed to inpatient- requiring IV solumedrol Q6

## 2013-06-19 DIAGNOSIS — J449 Chronic obstructive pulmonary disease, unspecified: Secondary | ICD-10-CM | POA: Diagnosis not present

## 2013-06-19 NOTE — Progress Notes (Signed)
Subjective: She feels somewhat better. She says she still feels like she chokes a little bit. She is coughing but not coughing anything up.  Objective: Vital signs in last 24 hours: Temp:  [97.6 F (36.4 C)-97.9 F (36.6 C)] 97.9 F (36.6 C) (01/24 0501) Pulse Rate:  [71-101] 71 (01/24 0501) Resp:  [18-20] 20 (01/24 0501) BP: (135-156)/(72-90) 146/78 mmHg (01/24 0501) SpO2:  [90 %-96 %] 96 % (01/24 0732) Weight change:  Last BM Date: 06/19/13  Intake/Output from previous day: 01/23 0701 - 01/24 0700 In: 480 [P.O.:480] Out: 3 [Stool:3]  PHYSICAL EXAM General appearance: alert, cooperative and mild distress Resp: rhonchi bilaterally Cardio: regular rate and rhythm, S1, S2 normal, no murmur, click, rub or gallop GI: soft, non-tender; bowel sounds normal; no masses,  no organomegaly Extremities: extremities normal, atraumatic, no cyanosis or edema  Lab Results:    Basic Metabolic Panel:  Recent Labs  46/96/2901/22/15 1111  NA 140  K 3.8  CL 101  CO2 25  GLUCOSE 113*  BUN 21  CREATININE 0.96  CALCIUM 9.3   Liver Function Tests: No results found for this basename: AST, ALT, ALKPHOS, BILITOT, PROT, ALBUMIN,  in the last 72 hours No results found for this basename: LIPASE, AMYLASE,  in the last 72 hours No results found for this basename: AMMONIA,  in the last 72 hours CBC:  Recent Labs  06/17/13 1111  WBC 6.9  NEUTROABS 3.9  HGB 13.2  HCT 40.2  MCV 82.9  PLT 268   Cardiac Enzymes:  Recent Labs  06/17/13 1111  TROPONINI <0.30   BNP: No results found for this basename: PROBNP,  in the last 72 hours D-Dimer: No results found for this basename: DDIMER,  in the last 72 hours CBG: No results found for this basename: GLUCAP,  in the last 72 hours Hemoglobin A1C: No results found for this basename: HGBA1C,  in the last 72 hours Fasting Lipid Panel: No results found for this basename: CHOL, HDL, LDLCALC, TRIG, CHOLHDL, LDLDIRECT,  in the last 72 hours Thyroid  Function Tests: No results found for this basename: TSH, T4TOTAL, FREET4, T3FREE, THYROIDAB,  in the last 72 hours Anemia Panel: No results found for this basename: VITAMINB12, FOLATE, FERRITIN, TIBC, IRON, RETICCTPCT,  in the last 72 hours Coagulation: No results found for this basename: LABPROT, INR,  in the last 72 hours Urine Drug Screen: Drugs of Abuse  No results found for this basename: labopia, cocainscrnur, labbenz, amphetmu, thcu, labbarb    Alcohol Level: No results found for this basename: ETH,  in the last 72 hours Urinalysis:  Recent Labs  06/17/13 2340  COLORURINE YELLOW  LABSPEC 1.020  PHURINE 5.0  GLUCOSEU 500*  HGBUR NEGATIVE  BILIRUBINUR NEGATIVE  KETONESUR NEGATIVE  PROTEINUR NEGATIVE  UROBILINOGEN 0.2  NITRITE NEGATIVE  LEUKOCYTESUR NEGATIVE   Misc. Labs:  ABGS No results found for this basename: PHART, PCO2, PO2ART, TCO2, HCO3,  in the last 72 hours CULTURES No results found for this or any previous visit (from the past 240 hour(s)). Studies/Results: Dg Chest 2 View  06/17/2013   CLINICAL DATA:  Short of breath  EXAM: CHEST  2 VIEW  COMPARISON:  CT chest 11/01/2011  FINDINGS: Cardiac enlargement without heart failure. Lungs are clear without infiltrate or effusion.  IMPRESSION: No active cardiopulmonary disease.   Electronically Signed   By: Marlan Palauharles  Clark M.D.   On: 06/17/2013 13:28    Medications:  Prior to Admission:  Prescriptions prior to admission  Medication  Sig Dispense Refill  . Ascorbic Acid (VITAMIN C) 1000 MG tablet Take 2,000 mg by mouth daily.      Marland Kitchen aspirin 81 MG tablet Take 160 mg by mouth daily.        Marland Kitchen atorvastatin (LIPITOR) 40 MG tablet Take 1 tablet by mouth every evening.      . cholecalciferol (VITAMIN D) 1000 UNITS tablet Take 1,000 Units by mouth daily.        Marland Kitchen lisinopril (PRINIVIL,ZESTRIL) 10 MG tablet Take 10 mg by mouth daily.        . metoprolol succinate (TOPROL-XL) 25 MG 24 hr tablet Take 25 mg by mouth daily.          Scheduled: . aspirin  162 mg Oral Daily  . atorvastatin  40 mg Oral QPM  . cholecalciferol  1,000 Units Oral Daily  . enoxaparin (LOVENOX) injection  50 mg Subcutaneous Q24H  . guaiFENesin  1,200 mg Oral BID  . levalbuterol  0.63 mg Nebulization Q6H  . levofloxacin  750 mg Oral Daily  . lisinopril  10 mg Oral Daily  . methylPREDNISolone (SOLU-MEDROL) injection  125 mg Intravenous Q6H  . metoprolol succinate  25 mg Oral Daily  . vitamin C  2,000 mg Oral Daily   Continuous:  ZOX:[id number], ipratropium, polyethylene glycol  Assesment: She was admitted with COPD exacerbation. We discussed this again and she still has very limited understanding. I think she's improving but not ready for discharge yet Active Problems:   COPD with acute exacerbation    Plan: Continue current treatments    LOS: 2 days   [name] 06/19/2013, 8:50 AM

## 2013-06-20 LAB — CBC WITH DIFFERENTIAL/PLATELET
BASOS PCT: 0 % (ref 0–1)
Basophils Absolute: 0 10*3/uL (ref 0.0–0.1)
Eosinophils Absolute: 0 10*3/uL (ref 0.0–0.7)
Eosinophils Relative: 0 % (ref 0–5)
HEMATOCRIT: 35.6 % — AB (ref 36.0–46.0)
Hemoglobin: 12 g/dL (ref 12.0–15.0)
LYMPHS ABS: 0.8 10*3/uL (ref 0.7–4.0)
Lymphocytes Relative: 5 % — ABNORMAL LOW (ref 12–46)
MCH: 27.3 pg (ref 26.0–34.0)
MCHC: 33.7 g/dL (ref 30.0–36.0)
MCV: 81.1 fL (ref 78.0–100.0)
MONOS PCT: 3 % (ref 3–12)
Monocytes Absolute: 0.4 10*3/uL (ref 0.1–1.0)
NEUTROS ABS: 15 10*3/uL — AB (ref 1.7–7.7)
Neutrophils Relative %: 92 % — ABNORMAL HIGH (ref 43–77)
Platelets: 287 10*3/uL (ref 150–400)
RBC: 4.39 MIL/uL (ref 3.87–5.11)
RDW: 15.7 % — ABNORMAL HIGH (ref 11.5–15.5)
WBC: 16.3 10*3/uL — ABNORMAL HIGH (ref 4.0–10.5)

## 2013-06-20 MED ORDER — ONDANSETRON HCL 4 MG/2ML IJ SOLN
4.0000 mg | Freq: Four times a day (QID) | INTRAMUSCULAR | Status: DC | PRN
  Administered 2013-06-20: 4 mg via INTRAVENOUS
  Filled 2013-06-20: qty 2

## 2013-06-20 MED ORDER — PANTOPRAZOLE SODIUM 40 MG PO TBEC
40.0000 mg | DELAYED_RELEASE_TABLET | Freq: Every day | ORAL | Status: DC
  Administered 2013-06-20 – 2013-06-22 (×3): 40 mg via ORAL
  Filled 2013-06-20 (×3): qty 1

## 2013-06-20 MED ORDER — METHYLPREDNISOLONE SODIUM SUCC 40 MG IJ SOLR
40.0000 mg | Freq: Four times a day (QID) | INTRAMUSCULAR | Status: DC
  Administered 2013-06-20 – 2013-06-21 (×3): 40 mg via INTRAVENOUS
  Filled 2013-06-20 (×3): qty 1

## 2013-06-20 NOTE — Progress Notes (Signed)
Subjective: She says she doesn't feel as well today. She feels like her heart is beating too fast although her pulse rate is only about 80. She is nauseated and has vomited. She had diarrhea earlier and had some dark stool but has not had any bowel movements in the last 48 hours.  Objective: Vital signs in last 24 hours: Temp:  [97.6 F (36.4 C)-98 F (36.7 C)] 98 F (36.7 C) (01/25 0444) Pulse Rate:  [60-73] 60 (01/25 0444) Resp:  [20] 20 (01/25 0444) BP: (124-157)/(79-90) 150/90 mmHg (01/25 0444) SpO2:  [91 %-97 %] 94 % (01/25 0800) Weight change:  Last BM Date: 06/19/13  Intake/Output from previous day: 01/24 0701 - 01/25 0700 In: 710 [P.O.:710] Out: -   PHYSICAL EXAM General appearance: alert, cooperative and mild distress Resp: rhonchi bilaterally Cardio: regular rate and rhythm, S1, S2 normal, no murmur, click, rub or gallop GI: soft, non-tender; bowel sounds normal; no masses,  no organomegaly Extremities: extremities normal, atraumatic, no cyanosis or edema  Lab Results:    Basic Metabolic Panel:  Recent Labs  21/30/86 1111  NA 140  K 3.8  CL 101  CO2 25  GLUCOSE 113*  BUN 21  CREATININE 0.96  CALCIUM 9.3   Liver Function Tests: No results found for this basename: AST, ALT, ALKPHOS, BILITOT, PROT, ALBUMIN,  in the last 72 hours No results found for this basename: LIPASE, AMYLASE,  in the last 72 hours No results found for this basename: AMMONIA,  in the last 72 hours CBC:  Recent Labs  06/17/13 1111  WBC 6.9  NEUTROABS 3.9  HGB 13.2  HCT 40.2  MCV 82.9  PLT 268   Cardiac Enzymes:  Recent Labs  06/17/13 1111  TROPONINI <0.30   BNP: No results found for this basename: PROBNP,  in the last 72 hours D-Dimer: No results found for this basename: DDIMER,  in the last 72 hours CBG: No results found for this basename: GLUCAP,  in the last 72 hours Hemoglobin A1C: No results found for this basename: HGBA1C,  in the last 72 hours Fasting Lipid  Panel: No results found for this basename: CHOL, HDL, LDLCALC, TRIG, CHOLHDL, LDLDIRECT,  in the last 72 hours Thyroid Function Tests: No results found for this basename: TSH, T4TOTAL, FREET4, T3FREE, THYROIDAB,  in the last 72 hours Anemia Panel: No results found for this basename: VITAMINB12, FOLATE, FERRITIN, TIBC, IRON, RETICCTPCT,  in the last 72 hours Coagulation: No results found for this basename: LABPROT, INR,  in the last 72 hours Urine Drug Screen: Drugs of Abuse  No results found for this basename: labopia, cocainscrnur, labbenz, amphetmu, thcu, labbarb    Alcohol Level: No results found for this basename: ETH,  in the last 72 hours Urinalysis:  Recent Labs  06/17/13 2340  COLORURINE YELLOW  LABSPEC 1.020  PHURINE 5.0  GLUCOSEU 500*  HGBUR NEGATIVE  BILIRUBINUR NEGATIVE  KETONESUR NEGATIVE  PROTEINUR NEGATIVE  UROBILINOGEN 0.2  NITRITE NEGATIVE  LEUKOCYTESUR NEGATIVE   Misc. Labs:  ABGS No results found for this basename: PHART, PCO2, PO2ART, TCO2, HCO3,  in the last 72 hours CULTURES No results found for this or any previous visit (from the past 240 hour(s)). Studies/Results: No results found.  Medications:  Prior to Admission:  Prescriptions prior to admission  Medication Sig Dispense Refill  . Ascorbic Acid (VITAMIN C) 1000 MG tablet Take 2,000 mg by mouth daily.      Marland Kitchen aspirin 81 MG tablet Take 160 mg by  mouth daily.        Marland Kitchen. atorvastatin (LIPITOR) 40 MG tablet Take 1 tablet by mouth every evening.      . cholecalciferol (VITAMIN D) 1000 UNITS tablet Take 1,000 Units by mouth daily.        Marland Kitchen. lisinopril (PRINIVIL,ZESTRIL) 10 MG tablet Take 10 mg by mouth daily.        . metoprolol succinate (TOPROL-XL) 25 MG 24 hr tablet Take 25 mg by mouth daily.         Scheduled: . aspirin  162 mg Oral Daily  . atorvastatin  40 mg Oral QPM  . cholecalciferol  1,000 Units Oral Daily  . enoxaparin (LOVENOX) injection  50 mg Subcutaneous Q24H  . guaiFENesin   1,200 mg Oral BID  . levalbuterol  0.63 mg Nebulization Q6H  . levofloxacin  750 mg Oral Daily  . lisinopril  10 mg Oral Daily  . methylPREDNISolone (SOLU-MEDROL) injection  40 mg Intravenous Q6H  . metoprolol succinate  25 mg Oral Daily  . pantoprazole  40 mg Oral Q1200  . vitamin C  2,000 mg Oral Daily   Continuous:  ZOX:[id number]:acetaminophen, ipratropium, ondansetron (ZOFRAN) IV, polyethylene glycol  Assesment: She was admitted with COPD exacerbation. She has palpitations and I think that's related to taking nebulizer treatments. She does not seem to have a significant cardiac arrhythmias. She's had nausea and vomiting. She has not had a bowel movement recently but I think that's because she had significant diarrhea on admission Active Problems:   COPD with acute exacerbation    Plan: She will have CBC because she had dark stool nature that she's not got significant GI bleeding. I will add pantoprazole. She will have Zofran as needed for nausea    LOS: 3 days   Marlee Trentman L 06/20/2013, 10:35 AM

## 2013-06-21 DIAGNOSIS — J449 Chronic obstructive pulmonary disease, unspecified: Secondary | ICD-10-CM | POA: Diagnosis not present

## 2013-06-21 DIAGNOSIS — K922 Gastrointestinal hemorrhage, unspecified: Secondary | ICD-10-CM | POA: Diagnosis not present

## 2013-06-21 DIAGNOSIS — K5289 Other specified noninfective gastroenteritis and colitis: Secondary | ICD-10-CM | POA: Diagnosis not present

## 2013-06-21 DIAGNOSIS — I10 Essential (primary) hypertension: Secondary | ICD-10-CM | POA: Diagnosis not present

## 2013-06-21 DIAGNOSIS — J159 Unspecified bacterial pneumonia: Secondary | ICD-10-CM | POA: Diagnosis not present

## 2013-06-21 MED ORDER — METHYLPREDNISOLONE SODIUM SUCC 40 MG IJ SOLR
40.0000 mg | Freq: Two times a day (BID) | INTRAMUSCULAR | Status: DC
  Administered 2013-06-21 – 2013-06-22 (×2): 40 mg via INTRAVENOUS
  Filled 2013-06-21 (×2): qty 1

## 2013-06-21 NOTE — Progress Notes (Signed)
Subjective: Patient claims her breathing is improving. However, she is still cough and wheezing intermittently. She is complaining of constipation. Objective: Vital signs in last 24 hours: Temp:  [97.5 F (36.4 C)-98.1 F (36.7 C)] 97.9 F (36.6 C) (01/26 0452) Pulse Rate:  [66-74] 66 (01/26 0452) Resp:  [20] 20 (01/26 0452) BP: (122-138)/(73-82) 122/73 mmHg (01/26 0452) SpO2:  [93 %-98 %] 93 % (01/26 0452) Weight change:  Last BM Date: 06/19/13  Intake/Output from previous day:    PHYSICAL EXAM General appearance: alert and no distress Resp: diminished breath sounds bilaterally and wheezes bilaterally Cardio: S1, S2 normal GI: soft, non-tender; bowel sounds normal; no masses,  no organomegaly Extremities: extremities normal, atraumatic, no cyanosis or edema  Lab Results:    @labtest @ [facility] No results found for this basename: PHART, PCO2, PO2ART, TCO2, HCO3,  in the last 72 hours CULTURES No results found for this or any previous visit (from the past 240 hour(s)). Studies/Results: No results found.  Medications: I have reviewed the patient's current medications.  Assesment: 1.COPD Exacerbation 2. Hypertension 3.CAD 4. Obesity 5. H/O GI bleed 5. H/O ischemic colitis  Active Problems:   COPD with acute exacerbation    Plan: Medications reviewed Will taper steroid Continue nebulizer and levequine    LOS: 4 days   Meldon Hanzlik 06/21/2013, 7:49 AM

## 2013-06-21 NOTE — Progress Notes (Signed)
Patient c/o constipation. Miralax and prune juice given at this time.

## 2013-06-22 DIAGNOSIS — J159 Unspecified bacterial pneumonia: Secondary | ICD-10-CM | POA: Diagnosis not present

## 2013-06-22 DIAGNOSIS — K922 Gastrointestinal hemorrhage, unspecified: Secondary | ICD-10-CM | POA: Diagnosis not present

## 2013-06-22 DIAGNOSIS — K5289 Other specified noninfective gastroenteritis and colitis: Secondary | ICD-10-CM | POA: Diagnosis not present

## 2013-06-22 DIAGNOSIS — J449 Chronic obstructive pulmonary disease, unspecified: Secondary | ICD-10-CM | POA: Diagnosis not present

## 2013-06-22 DIAGNOSIS — I10 Essential (primary) hypertension: Secondary | ICD-10-CM | POA: Diagnosis not present

## 2013-06-22 LAB — CBC
HCT: 39.2 % (ref 36.0–46.0)
Hemoglobin: 13.3 g/dL (ref 12.0–15.0)
MCH: 27.4 pg (ref 26.0–34.0)
MCHC: 33.9 g/dL (ref 30.0–36.0)
MCV: 80.7 fL (ref 78.0–100.0)
Platelets: 332 10*3/uL (ref 150–400)
RBC: 4.86 MIL/uL (ref 3.87–5.11)
RDW: 15.9 % — ABNORMAL HIGH (ref 11.5–15.5)
WBC: 13.9 10*3/uL — ABNORMAL HIGH (ref 4.0–10.5)

## 2013-06-22 LAB — OCCULT BLOOD X 1 CARD TO LAB, STOOL: Fecal Occult Bld: POSITIVE — AB

## 2013-06-22 MED ORDER — PREDNISONE 20 MG PO TABS
40.0000 mg | ORAL_TABLET | Freq: Every day | ORAL | Status: DC
Start: 2013-06-22 — End: 2013-06-24
  Administered 2013-06-22 – 2013-06-24 (×2): 40 mg via ORAL
  Filled 2013-06-22 (×3): qty 2

## 2013-06-22 MED ORDER — MOMETASONE FURO-FORMOTEROL FUM 200-5 MCG/ACT IN AERO
2.0000 | INHALATION_SPRAY | Freq: Two times a day (BID) | RESPIRATORY_TRACT | Status: DC
  Administered 2013-06-22 – 2013-06-24 (×5): 2 via RESPIRATORY_TRACT
  Filled 2013-06-22: qty 8.8

## 2013-06-22 NOTE — Progress Notes (Signed)
She is doing better from a respiratory point of view. She still has some rhonchi on the right and is still mildly short of breath but overall better. However she states that her stool was black and tarry. This is being worked up.  Exam shows a she's awake and alert and in no acute distress. Her chest shows rhonchi on the right. Temp is 98.1, pulse 79, respirations 20, blood pressure 150/85 and O2 saturation 94%.  She is improving from a pulmonary point of view but now has what may be melena. This is being worked up. I will plan to follow somewhat more peripherally. I discontinued IV steroids and switched to prednisone and will start her on inhaled steroid

## 2013-06-22 NOTE — Progress Notes (Signed)
Subjective: Patient claims her stool dark tarry. She small frequent stool. She is also complaining of lower quadrant pain . Objective: Vital signs in last 24 hours: Temp:  [97.8 F (36.6 C)-98.1 F (36.7 C)] 98.1 F (36.7 C) (01/27 0546) Pulse Rate:  [71-81] 79 (01/27 0546) Resp:  [20] 20 (01/27 0546) BP: (121-154)/(73-90) 150/85 mmHg (01/27 0546) SpO2:  [93 %-98 %] 94 % (01/27 0706) Weight change:  Last BM Date: 06/19/13  Intake/Output from previous day: 01/26 0701 - 01/27 0700 In: 480 [P.O.:480] Out: 3 [Urine:3]  PHYSICAL EXAM General appearance: alert and no distress Resp: diminished breath sounds bilaterally and wheezes bilaterally Cardio: S1, S2 normal GI: soft, non-tender; bowel sounds normal; no masses,  no organomegaly Extremities: extremities normal, atraumatic, no cyanosis or edema  Lab Results:    @labtest @ ABGS No results found for this basename: PHART, PCO2, PO2ART, TCO2, HCO3,  in the last 72 hours CULTURES No results found for this or any previous visit (from the past 240 hour(s)). Studies/Results: No results found.  Medications: I have reviewed the patient's current medications.  Assesment: 1.COPD Exacerbation 2. Hypertension 3.CAD 4. Obesity 5. H/O GI bleed 5. H/O ischemic colitis  Active Problems:   COPD with acute exacerbation    Plan: Medications reviewed Will taper steroid Continue nebulizer and Levaquin D/C Lovenox CBC and occult stool blood    LOS: 5 days   Francis Doenges 06/22/2013, 8:08 AM

## 2013-06-22 NOTE — Progress Notes (Signed)
Dr. Felecia ShellingFanta paged regarding positive fecal occult blood

## 2013-06-22 NOTE — Progress Notes (Signed)
NAMConsuella [name]:  [name], Valerie                   ACCOUNT NO.:  [id number]  MEDICAL RECORD NO.:  [id number]  LOCATION:  [id number]                          FACILITY:  [facility]  PHYSICIAN:  Mayrene Bastarache L. Juanetta GoslingHawkins, M.D.DATE OF BIRTH:  03-12-1946  DATE OF PROCEDURE: DATE OF DISCHARGE:                                PROGRESS NOTE   HISTORY OF PRESENT ILLNESS:  [name] continues to complain of shortness of breath.  She has been coughing and had some nausea.  She has no other new complaints.  PHYSICAL EXAMINATION:  VITAL SIGNS:  Today shows that her temperature is 99, pulse 81, respirations 20, blood pressure 121/73, and O2 sats 96%. CHEST:  Pretty clear. HEART:  Regular. ABDOMEN:  Soft. EXTREMITIES:  She has no edema. CENTRAL NERVOUS SYSTEM:  Grossly intact.  LABORATORY DATA:  Her white blood count yesterday was 16,000, but hemoglobin was stable at 12.  ASSESSMENT:  She has chronic obstructive pulmonary disease exacerbation. She seems to be improving.  She had some nausea and vomiting, which is better.  PLAN:  My plan is for her to continue with her current treatments and medications.  No changes today.  Reduce her steroids.  Continue with everything else.     Kelten Enochs L. Juanetta GoslingHawkins, M.D.     ELH/MEDQ  D:  06/21/2013  T:  06/22/2013  Job:  [id number]

## 2013-06-23 ENCOUNTER — Encounter (HOSPITAL_COMMUNITY): Payer: Self-pay | Admitting: Gastroenterology

## 2013-06-23 ENCOUNTER — Encounter (HOSPITAL_COMMUNITY)
Admission: EM | Disposition: A | Discharge status: Home / Self Care | Payer: Self-pay | Source: Home / Self Care | Attending: Internal Medicine

## 2013-06-23 DIAGNOSIS — K5900 Constipation, unspecified: Secondary | ICD-10-CM | POA: Diagnosis not present

## 2013-06-23 DIAGNOSIS — Z800 Family history of malignant neoplasm of digestive organs: Secondary | ICD-10-CM | POA: Diagnosis not present

## 2013-06-23 DIAGNOSIS — K921 Melena: Secondary | ICD-10-CM | POA: Diagnosis not present

## 2013-06-23 DIAGNOSIS — K922 Gastrointestinal hemorrhage, unspecified: Secondary | ICD-10-CM | POA: Diagnosis not present

## 2013-06-23 DIAGNOSIS — K449 Diaphragmatic hernia without obstruction or gangrene: Secondary | ICD-10-CM | POA: Diagnosis not present

## 2013-06-23 DIAGNOSIS — K5289 Other specified noninfective gastroenteritis and colitis: Secondary | ICD-10-CM | POA: Diagnosis not present

## 2013-06-23 DIAGNOSIS — R933 Abnormal findings on diagnostic imaging of other parts of digestive tract: Secondary | ICD-10-CM

## 2013-06-23 DIAGNOSIS — J449 Chronic obstructive pulmonary disease, unspecified: Secondary | ICD-10-CM | POA: Diagnosis not present

## 2013-06-23 DIAGNOSIS — I10 Essential (primary) hypertension: Secondary | ICD-10-CM | POA: Diagnosis not present

## 2013-06-23 DIAGNOSIS — R195 Other fecal abnormalities: Secondary | ICD-10-CM | POA: Diagnosis not present

## 2013-06-23 DIAGNOSIS — J159 Unspecified bacterial pneumonia: Secondary | ICD-10-CM | POA: Diagnosis not present

## 2013-06-23 DIAGNOSIS — K2970 Gastritis, unspecified, without bleeding: Secondary | ICD-10-CM | POA: Diagnosis not present

## 2013-06-23 HISTORY — PX: ESOPHAGOGASTRODUODENOSCOPY: SHX5428

## 2013-06-23 LAB — CBC
HEMATOCRIT: 36.5 % (ref 36.0–46.0)
Hemoglobin: 12.1 g/dL (ref 12.0–15.0)
MCH: 26.8 pg (ref 26.0–34.0)
MCHC: 33.2 g/dL (ref 30.0–36.0)
MCV: 80.8 fL (ref 78.0–100.0)
Platelets: 296 10*3/uL (ref 150–400)
RBC: 4.52 MIL/uL (ref 3.87–5.11)
RDW: 15.8 % — ABNORMAL HIGH (ref 11.5–15.5)
WBC: 12.9 10*3/uL — ABNORMAL HIGH (ref 4.0–10.5)

## 2013-06-23 SURGERY — EGD (ESOPHAGOGASTRODUODENOSCOPY)
Anesthesia: Moderate Sedation

## 2013-06-23 MED ORDER — SODIUM CHLORIDE 0.9 % IV SOLN: INTRAVENOUS | Status: DC

## 2013-06-23 MED ORDER — ONDANSETRON HCL 4 MG/2ML IJ SOLN
INTRAMUSCULAR | Status: DC | PRN
  Administered 2013-06-23: 4 mg via INTRAVENOUS

## 2013-06-23 MED ORDER — PANTOPRAZOLE SODIUM 40 MG PO TBEC
40.0000 mg | DELAYED_RELEASE_TABLET | Freq: Two times a day (BID) | ORAL | Status: DC
  Administered 2013-06-23 – 2013-06-24 (×3): 40 mg via ORAL
  Filled 2013-06-23 (×3): qty 1

## 2013-06-23 MED ORDER — SUCRALFATE 1 GM/10ML PO SUSP
1.0000 g | Freq: Three times a day (TID) | ORAL | Status: DC
  Administered 2013-06-23 – 2013-06-24 (×3): 1 g via ORAL
  Filled 2013-06-23 (×3): qty 10

## 2013-06-23 MED ORDER — LIDOCAINE VISCOUS 2 % MT SOLN
OROMUCOSAL | Status: AC
  Filled 2013-06-23: qty 15

## 2013-06-23 MED ORDER — MIDAZOLAM HCL 5 MG/5ML IJ SOLN
INTRAMUSCULAR | Status: DC | PRN
  Administered 2013-06-23: 2 mg via INTRAVENOUS
  Administered 2013-06-23: 1 mg via INTRAVENOUS

## 2013-06-23 MED ORDER — MIDAZOLAM HCL 5 MG/5ML IJ SOLN
INTRAMUSCULAR | Status: AC
  Filled 2013-06-23: qty 10

## 2013-06-23 MED ORDER — PANTOPRAZOLE SODIUM 40 MG PO TBEC: 40.0000 mg | DELAYED_RELEASE_TABLET | Freq: Every day | ORAL | Status: DC

## 2013-06-23 MED ORDER — STERILE WATER FOR IRRIGATION IR SOLN
Status: DC | PRN
  Administered 2013-06-23: 15:00:00

## 2013-06-23 MED ORDER — ONDANSETRON HCL 4 MG/2ML IJ SOLN
INTRAMUSCULAR | Status: AC
  Filled 2013-06-23: qty 2

## 2013-06-23 MED ORDER — MEPERIDINE HCL 100 MG/ML IJ SOLN
INTRAMUSCULAR | Status: AC
  Filled 2013-06-23: qty 2

## 2013-06-23 MED ORDER — MEPERIDINE HCL 100 MG/ML IJ SOLN
INTRAMUSCULAR | Status: DC | PRN
  Administered 2013-06-23: 25 mg via INTRAVENOUS
  Administered 2013-06-23: 50 mg via INTRAVENOUS

## 2013-06-23 MED ORDER — LIDOCAINE VISCOUS 2 % MT SOLN
OROMUCOSAL | Status: DC | PRN
  Administered 2013-06-23 (×2): 2 mL via OROMUCOSAL

## 2013-06-23 NOTE — Progress Notes (Signed)
Subjective: Patient continue to have dark stool. Her occult stool test was positive. No drop in H/H. No nausea, vomiting or hematemesis. Objective: Vital signs in last 24 hours: Temp:  [97.2 F (36.2 C)-98.2 F (36.8 C)] 97.2 F (36.2 C) (01/28 0618) Pulse Rate:  [57-77] 57 (01/28 0618) Resp:  [20] 20 (01/28 0618) BP: (129-153)/(56-75) 153/71 mmHg (01/28 0618) SpO2:  [94 %-98 %] 98 % (01/28 0730) Weight change:  Last BM Date: 06/21/13  Intake/Output from previous day: 01/27 0701 - 01/28 0700 In: 480 [P.O.:480] Out: 7 [Urine:7]  PHYSICAL EXAM General appearance: alert and no distress Resp: diminished breath sounds bilaterally and wheezes bilaterally Cardio: S1, S2 normal GI: soft, non-tender; bowel sounds normal; no masses,  no organomegaly Extremities: extremities normal, atraumatic, no cyanosis or edema  Lab Results:    @labtest @ ABGS No results found for this basename: PHART, PCO2, PO2ART, TCO2, HCO3,  in the last 72 hours CULTURES No results found for this or any previous visit (from the past 240 hour(s)). Studies/Results: No results found.  Medications: I have reviewed the patient's current medications.  Assesment: 1.COPD Exacerbation 2. Hypertension 3.CAD 4. Obesity 5. H/O GI bleed 5. H/O ischemic colitis  Active Problems:   COPD with acute exacerbation    Plan: Medications reviewed Continue nebulizer and Levaquin D/C Lovenox D/c Asprin GI consult    LOS: 6 days   [name] 06/23/2013, 7:47 AM

## 2013-06-23 NOTE — Op Note (Signed)
[facility] [address] [place], [id number]   ENDOSCOPY PROCEDURE REPORT  PATIENT: [name], Valerie C.  MR#: [id number] BIRTHDATE: 1945-09-18 , 67  yrs. old GENDER: Female ENDOSCOPIST: R.  Roetta SessionsMichael Alondria Mousseau, MD FACP FACG REFERRED BY:  Glenice Laineesafaye Fanta, M.D. PROCEDURE DATE:  06/23/2013 PROCEDURE:     EGD with gastric biopsy (incidental dilation of a cervical web with scope passage)  INDICATIONS:     melena; Hemoccult positive stool  INFORMED CONSENT:   The risks, benefits, limitations, alternatives and imponderables have been discussed.  The potential for biopsy, esophogeal dilation, etc. have also been reviewed.  Questions have been answered.  All parties agreeable.  Please see the history and physical in the medical record for more information.  MEDICATIONS:Versed 3 mg IV and femoral 75 mg IV in divided doses. Zofran 4 mg IV. Xylocaine gel orally  DESCRIPTION OF PROCEDURE:   The [id number] ([id number]  endoscope was introduced through the mouth and advanced to the second portion of the duodenum without difficulty or limitations.  The mucosal surfaces were surveyed very carefully during advancement of the scope and upon withdrawal.  Retroflexion view of the proximal stomach and esophagogastric junction was performed.      FINDINGS: Occult cervical esophageal web dilated with scope passage; Also, a noncritical Schatzki's ring. Normal esophageal mucosa otherwise.  Stomach empty. 4 cm hiatal hernia. multiple linear areas of excoriation/ erosions and superficial ulceration of the gastric mucosa straddling the diaphragmatic hiatus. Multiple erosions involving the antral and prepyloric area with significant friability. No infiltrating process observed. Pyloric channel easily traversed. Examination of bulb and second portion revealed no abnormalities.  THERAPEUTIC / DIAGNOSTIC MANEUVERS PERFORMED:  Biopsies of the inflamed gastric mucosa taken for histologic  study   COMPLICATIONS:  None  IMPRESSION:    Cervical esophageal web-dilated incidentally with scope passage. Noncritical Schatzki's ring. Rather large hiatal hernia with Sheria LangCameron lesions present. Antral erosions. Status post gastric biopsy. I suspect she did experience a recent upper GI bleed secondary to the above-mentioned lesions.   RECOMMENDATIONS:  Avoid nonsteroidal agents. Increase PPI to twice a day. Add Carafate suspension 4 times a day. Advance diet. Followup on pathology.    _______________________________ R. Roetta SessionsMichael Kashis Penley, MD FACP [facility] eSigned:  R. Roetta SessionsMichael Elva Breaker, MD FACP [facility] 06/23/2013 3:15 PM     CC:  PATIENT NAME:  [name], Valerie C. MR#: [id number]

## 2013-06-23 NOTE — Consult Note (Signed)
Referring Provider: Dr. Felecia ShellingFanta Primary Care Physician:  Dr. Felecia ShellingFanta Primary Gastroenterologist:  Dr. Jena Gaussourk   Date of Admission: 06/17/13 Date of Consultation: 06/23/13  Reason for Consultation:  Possible melena, heme positive stool  HPI:  Judeth HornMary C Pirie is a 68 year old female admitted with COPD exacerbation on 1/22 and reported dark, tarry stool yesterday. Found to be heme positive; Hgb has remained in the 12-13 range without significant decline. She was last seen in our office in 2013; history significant for ischemic colitis, with last colonoscopy in Nov 2012.   States "sticky" balls. Pitch black. Started on admission. States she had been constipated on admission, gave laxative while admitted. First part that came out was "brown", then started getting black. States she "can't go" to the bathroom. Only has a small amount like little pebbles. Denies epigastric pain. Reports lower back pain. No abdominal pain. Two episodes of N/V since admission, thinks related to taking a large amount of pills at one time. Last episode on Saturday. No further N/V. Tolerating diet. Feels constipated. Normally can get up in the morning, drink a cup of coffee and then have a BM.   No dysphagia. No reflux. Baby aspirin daily. Denies any recent use of NSAIDs or aspirin powders as outpatient. Received IV steroids during admission, which has been converted to oral as of yesterday. On aspirin 162 mg during admission and Lovenox 50 mg SQ starting on admission but discontinued yesterday. Last dose on 1/26. No prior endoscopy.    Past Medical History  Diagnosis Date  . Hypertension   . GI bleed Nov 2012    ischemic colitis  . Colitis, ischemic 03/2011  . Kidney stone   . Gallstones   . Multiple pulmonary nodules 03/2011  . Chronic back pain   . COPD (chronic obstructive pulmonary disease)   . Hiatal hernia   . Diverticulosis     Past Surgical History  Procedure Laterality Date  . Spinal fusion      x3  .  Salpingoophorectomy      left  . Coronary stent placement  1995  . Coronary artery bypass graft  1998  . Colonoscopy  04/16/2011    Dr. Darrick PennaFields: moderate diverticulosis, small internal hemorrhoids, likely ischemic colitis  . Abdominal hysterectomy    . Small intestine surgery      ?adhesive disease? patient states my intestines were "stuck to my womb"    Prior to Admission medications   Medication Sig Start Date End Date Taking? Authorizing Provider  Ascorbic Acid (VITAMIN C) 1000 MG tablet Take 2,000 mg by mouth daily.   Yes Historical Provider, MD  aspirin 81 MG tablet Take 160 mg by mouth daily.     Yes Historical Provider, MD  atorvastatin (LIPITOR) 40 MG tablet Take 1 tablet by mouth every evening. 04/24/13  Yes Historical Provider, MD  cholecalciferol (VITAMIN D) 1000 UNITS tablet Take 1,000 Units by mouth daily.     Yes Historical Provider, MD  lisinopril (PRINIVIL,ZESTRIL) 10 MG tablet Take 10 mg by mouth daily.     Yes Historical Provider, MD  metoprolol succinate (TOPROL-XL) 25 MG 24 hr tablet Take 25 mg by mouth daily.     Yes Historical Provider, MD    Current Facility-Administered Medications  Medication Dose Route Frequency Provider Last Rate Last Dose  . acetaminophen (TYLENOL) tablet 650 mg  650 mg Oral Q6H PRN Avon Gullyesfaye Fanta, MD   650 mg at 06/19/13 2123  . atorvastatin (LIPITOR) tablet 40 mg  40 mg Oral  QPM Avon Gully, MD   40 mg at 06/22/13 1658  . cholecalciferol (VITAMIN D) tablet 1,000 Units  1,000 Units Oral Daily Avon Gully, MD   1,000 Units at 06/22/13 0900  . guaiFENesin (MUCINEX) 12 hr tablet 1,200 mg  1,200 mg Oral BID Fredirick Maudlin, MD   1,200 mg at 06/22/13 2235  . ipratropium (ATROVENT) nebulizer solution 0.5 mg  0.5 mg Nebulization Q4H PRN Avon Gully, MD   0.5 mg at 06/19/13 0732  . levalbuterol (XOPENEX) nebulizer solution 0.63 mg  0.63 mg Nebulization Q6H Avon Gully, MD   0.63 mg at 06/23/13 0729  . levofloxacin (LEVAQUIN) tablet 750 mg  750  mg Oral Daily Avon Gully, MD   750 mg at 06/22/13 0901  . lisinopril (PRINIVIL,ZESTRIL) tablet 10 mg  10 mg Oral Daily Avon Gully, MD   10 mg at 06/22/13 0901  . metoprolol succinate (TOPROL-XL) 24 hr tablet 25 mg  25 mg Oral Daily Avon Gully, MD   25 mg at 06/22/13 0901  . mometasone-formoterol (DULERA) 200-5 MCG/ACT inhaler 2 puff  2 puff Inhalation BID Fredirick Maudlin, MD   2 puff at 06/23/13 0730  . ondansetron (ZOFRAN) injection 4 mg  4 mg Intravenous Q6H PRN Fredirick Maudlin, MD   4 mg at 06/20/13 1233  . pantoprazole (PROTONIX) EC tablet 40 mg  40 mg Oral Q1200 Fredirick Maudlin, MD   40 mg at 06/22/13 1105  . polyethylene glycol (MIRALAX / GLYCOLAX) packet 17 g  17 g Oral Daily PRN Avon Gully, MD   17 g at 06/22/13 0900  . predniSONE (DELTASONE) tablet 40 mg  40 mg Oral Q breakfast Fredirick Maudlin, MD   40 mg at 06/22/13 0905  . vitamin C (ASCORBIC ACID) tablet 2,000 mg  2,000 mg Oral Daily Avon Gully, MD   2,000 mg at 06/22/13 0900    Allergies as of 06/17/2013 - Review Complete 06/17/2013  Allergen Reaction Noted  . Omnipaque [iohexol]  09/19/2010  . Penicillins  09/19/2010    Family History  Problem Relation Age of Onset  . Stomach cancer Mother 72    ? believes it was stomach  . Bone cancer Sister 42  . Breast cancer Sister 61  . Colon cancer Neg Hx     History   Social History  . Marital Status: Divorced    Spouse Name: N/A    Number of Children: 0  . Years of Education: N/A   Occupational History  . disabled    Social History Main Topics  . Smoking status: Former Smoker -- 1.00 packs/day for 40 years    Types: Cigarettes    Quit date: 09/14/2010  . Smokeless tobacco: Not on file  . Alcohol Use: Yes     Comment: couple drinks/yr  . Drug Use: No     Comment: marijuana (QUIT couple yrs ago)  . Sexual Activity: Not on file   Other Topics Concern  . Not on file   Social History Narrative   Lives alone    Review of Systems: As mentioned  in HPI.  Physical Exam: Vital signs in last 24 hours: Temp:  [97.2 F (36.2 C)-98.2 F (36.8 C)] 97.2 F (36.2 C) (01/28 0618) Pulse Rate:  [57-77] 57 (01/28 0618) Resp:  [20] 20 (01/28 0618) BP: (129-153)/(56-75) 153/71 mmHg (01/28 0618) SpO2:  [94 %-98 %] 98 % (01/28 0730) Last BM Date: 06/21/13 General:   Alert,  Well-developed, well-nourished, pleasant and cooperative in  NAD Head:  Normocephalic and atraumatic. Eyes:  Sclera clear, no icterus.   Conjunctiva pink. Ears:  Normal auditory acuity. Nose:  No deformity, discharge,  or lesions. Mouth:  No deformity or lesions, poor dentition. Edentulous upper palate. Neck:  Supple; no masses or thyromegaly. Lungs:  Scattered rhonchi, coarse bilaterally Heart:  S1 S2 present without appreciable murmur Abdomen:  Soft, nontender and nondistended. No masses, hepatosplenomegaly or hernias noted; difficult to appreciate any HSM due to large AP diameter. +BS.  Rectal:  Deferred    Msk:  Symmetrical without gross deformities. Normal posture. Extremities:  Left trace pedal edema Neurologic:  Alert and  oriented x4;  grossly normal neurologically. Skin:  Intact without significant lesions or rashes. Cervical Nodes:  No significant cervical adenopathy. Psych:  Alert and cooperative. Normal mood and affect.  Intake/Output from previous day: 01/27 0701 - 01/28 0700 In: 480 [P.O.:480] Out: 7 [Urine:7] Intake/Output this shift:    Lab Results:  Recent Labs  06/20/13 1105 06/22/13 0845 06/23/13 0605  WBC 16.3* 13.9* 12.9*  HGB 12.0 13.3 12.1  HCT 35.6* 39.2 36.5  PLT 287 332 296    Impression: 68 year old female with reported black, tarry stool since admission, noted to be heme positive but with persistent stable Hgb. Only taking baby aspirin as outpatient without routine use of other NSAIDs or aspirin powders. During admission, she was on Lovenox and IV steroids; Lovenox discontinued yesterday and steroids have been converted to  oral. Also notes considerable constipation as only aggravating symptom; she denies any significant upper GI symptoms, abdominal pain, early satiety.   Question if true melena at this point; her family history is significant for stomach cancer. She has never had an upper endoscopy. In the presence of steroids and Lovenox, may be beneficial to proceed with upper endoscopy for further evaluation. She has been NPO since midnight. I discussed risks and benefits with her, and she is agreeable to proceed.   Would also recommend more aggressive bowel regimen to include scheduled Miralax. If persistent constipation as outpatient, consider Amitiza or Linzess. As of note, colonoscopy up-to-date and due again in 2022; however, with heme positive stool, may need early interval colonoscopy if no source for melena found on upper endoscopy.   Plan: Remain NPO Protonix daily EGD with Dr. Jena Gauss today, 1/28 Possible colonoscopy as outpatient due to heme positive stool Miralax scheduled daily Continue to hold Lovenox   Nira Retort, ANP-BC [facility]     LOS: 6 days    06/23/2013, 8:06 AM  Attending note:  Agree with above assessment and recommendations. Patient seen in endoscopy this afternoon. Agree with need for EGD.TheFurther recommendations to follow risks, benefits, limitations, alternatives and imponderables have been reviewed with the patient. Potential for esophageal dilation, biopsy, etc. have also been reviewed.  Questions have been answered. All parties agreeable.

## 2013-06-23 NOTE — Progress Notes (Signed)
She seems to be doing well from a pulmonary point of view. I'm going to plan to sign off as far as her inpatient care is concerned related to see her if needed. She is now being worked up for GI bleeding.  Thanks for allowing me to see her with you

## 2013-06-24 DIAGNOSIS — I10 Essential (primary) hypertension: Secondary | ICD-10-CM | POA: Diagnosis not present

## 2013-06-24 DIAGNOSIS — K922 Gastrointestinal hemorrhage, unspecified: Secondary | ICD-10-CM | POA: Diagnosis not present

## 2013-06-24 DIAGNOSIS — J159 Unspecified bacterial pneumonia: Secondary | ICD-10-CM | POA: Diagnosis not present

## 2013-06-24 DIAGNOSIS — K5289 Other specified noninfective gastroenteritis and colitis: Secondary | ICD-10-CM | POA: Diagnosis not present

## 2013-06-24 DIAGNOSIS — J449 Chronic obstructive pulmonary disease, unspecified: Secondary | ICD-10-CM | POA: Diagnosis not present

## 2013-06-24 MED ORDER — PREDNISONE (PAK) 10 MG PO TABS: ORAL_TABLET | Freq: Every day | ORAL | Status: DC

## 2013-06-24 MED ORDER — PANTOPRAZOLE SODIUM 40 MG PO TBEC: 40.0000 mg | DELAYED_RELEASE_TABLET | Freq: Two times a day (BID) | ORAL | Status: AC

## 2013-06-24 MED ORDER — MOMETASONE FURO-FORMOTEROL FUM 200-5 MCG/ACT IN AERO: 2.0000 | INHALATION_SPRAY | Freq: Two times a day (BID) | RESPIRATORY_TRACT | Status: DC

## 2013-06-24 MED ORDER — LEVALBUTEROL TARTRATE 45 MCG/ACT IN AERO: 2.0000 | INHALATION_SPRAY | Freq: Four times a day (QID) | RESPIRATORY_TRACT | Status: DC | PRN

## 2013-06-24 MED ORDER — LEVOFLOXACIN 750 MG PO TABS: 750.0000 mg | ORAL_TABLET | Freq: Every day | ORAL | Status: DC

## 2013-06-24 MED ORDER — LEVALBUTEROL HCL 0.63 MG/3ML IN NEBU
0.6300 mg | INHALATION_SOLUTION | Freq: Four times a day (QID) | RESPIRATORY_TRACT | Status: DC
  Administered 2013-06-24: 0.63 mg via RESPIRATORY_TRACT
  Filled 2013-06-24: qty 3

## 2013-06-24 NOTE — Discharge Summary (Signed)
Physician Discharge Summary  Patient ID: [name] MRN: [id number] DOB/AGE: 02-15-1946 68 y.o. Primary Care Physician:Geovannie Vilar, MD Admit date: 06/17/2013 Discharge date: 06/24/2013    Discharge Diagnoses:  .COPD Exacerbation  2. Hypertension  3.CAD  4. Obesity  5. H/O GI bleed  6. H/O ischemic colitis  7. Hiatal Hernia    Active Problems:   COPD with acute exacerbation     Medication List    STOP taking these medications       aspirin 81 MG tablet      TAKE these medications       atorvastatin 40 MG tablet  Commonly known as:  LIPITOR  Take 1 tablet by mouth every evening.     cholecalciferol 1000 UNITS tablet  Commonly known as:  VITAMIN D  Take 1,000 Units by mouth daily.     levalbuterol 45 MCG/ACT inhaler  Commonly known as:  XOPENEX HFA  Inhale 2 puffs into the lungs every 6 (six) hours as needed for wheezing.     levofloxacin 750 MG tablet  Commonly known as:  LEVAQUIN  Take 1 tablet (750 mg total) by mouth daily.     lisinopril 10 MG tablet  Commonly known as:  PRINIVIL,ZESTRIL  Take 10 mg by mouth daily.     metoprolol succinate 25 MG 24 hr tablet  Commonly known as:  TOPROL-XL  Take 25 mg by mouth daily.     mometasone-formoterol 200-5 MCG/ACT Aero  Commonly known as:  DULERA  Inhale 2 puffs into the lungs 2 (two) times daily.     pantoprazole 40 MG tablet  Commonly known as:  PROTONIX  Take 1 tablet (40 mg total) by mouth 2 (two) times daily.     predniSONE 10 MG tablet  Commonly known as:  STERAPRED UNI-PAK  Take by mouth daily. 4 tab po daily for 3 days, then 3 tab po for 3 days, then 2 tab po daily for 3 days, then 1 tab po for 3 days.     vitamin C 1000 MG tablet  Take 2,000 mg by mouth daily.        Discharged Condition: improved    Consults: Pulmonary and GI  Significant Diagnostic Studies: Dg Chest 2 View  06/17/2013   CLINICAL DATA:  Short of breath  EXAM: CHEST  2 VIEW  COMPARISON:  CT chest 11/01/2011   FINDINGS: Cardiac enlargement without heart failure. Lungs are clear without infiltrate or effusion.  IMPRESSION: No active cardiopulmonary disease.   Electronically Signed   By: Marlan Palau M.D.   On: 06/17/2013 13:28    Lab Results: Basic Metabolic Panel: No results found for this basename: NA, K, CL, CO2, GLUCOSE, BUN, CREATININE, CALCIUM, MG, PHOS,  in the last 72 hours Liver Function Tests: No results found for this basename: AST, ALT, ALKPHOS, BILITOT, PROT, ALBUMIN,  in the last 72 hours   CBC:  Recent Labs  06/22/13 0845 06/23/13 0605  WBC 13.9* 12.9*  HGB 13.3 12.1  HCT 39.2 36.5  MCV 80.7 80.8  PLT 332 296    No results found for this or any previous visit (from the past 240 hour(s)).   Hospital Course:   This is a 68 years old female patient was admitted due to COPD exacerbation. She was treated with iv antibiotics, iv steroid and nebulizer treatment. Patient developed GI bleeding while in hospital. No drop in H/H. EGD was done and found to have hiatal hernia with antral erosion. Patient started on PPI  and discharged in stable condition.  Discharge Exam: Blood pressure 147/90, pulse 61, temperature 97.9 F (36.6 C), temperature source Oral, resp. rate 16, height 5\' 3"  (1.6 m), weight 100.9 kg (222 lb 7.1 oz), SpO2 97.00%.   Disposition:  home        Follow-up Information   [facility].   Specialty:  Internal Medicine   Contact information:   [address] [place] [address] [phone]       Signed: Avon GullyFANTA,Taralyn Ferraiolo   06/24/2013, 7:57 AM

## 2013-06-24 NOTE — Progress Notes (Signed)
Patient IV removed. Discharge instructions reviewed. Patient verbalized understanding. Ready for discharge home

## 2013-06-26 ENCOUNTER — Encounter: Payer: Self-pay | Admitting: Internal Medicine

## 2013-06-28 ENCOUNTER — Encounter (HOSPITAL_COMMUNITY): Payer: Self-pay | Admitting: Internal Medicine

## 2013-06-29 ENCOUNTER — Other Ambulatory Visit: Payer: Self-pay | Admitting: Internal Medicine

## 2013-06-29 ENCOUNTER — Telehealth: Payer: Self-pay

## 2013-06-29 DIAGNOSIS — K529 Noninfective gastroenteritis and colitis, unspecified: Secondary | ICD-10-CM

## 2013-06-29 NOTE — Telephone Encounter (Signed)
Results cc to PCP 

## 2013-06-29 NOTE — Telephone Encounter (Signed)
Reminder in epic °

## 2013-06-29 NOTE — Telephone Encounter (Signed)
Letter from: Corbin Adeourk, Robert M Reason for Letter: Results Review  Send letter to patient.  Send copy of letter with path to referring provider and PCP.   Would offer ov and cbc/recheck in 3 months

## 2013-06-29 NOTE — Telephone Encounter (Signed)
Letter mailed to pt and lab order is on file. 

## 2013-07-02 DIAGNOSIS — I10 Essential (primary) hypertension: Secondary | ICD-10-CM | POA: Diagnosis not present

## 2013-07-02 DIAGNOSIS — J159 Unspecified bacterial pneumonia: Secondary | ICD-10-CM | POA: Diagnosis not present

## 2013-07-02 DIAGNOSIS — D649 Anemia, unspecified: Secondary | ICD-10-CM | POA: Diagnosis not present

## 2013-07-27 DIAGNOSIS — I10 Essential (primary) hypertension: Secondary | ICD-10-CM | POA: Diagnosis not present

## 2013-07-27 DIAGNOSIS — D649 Anemia, unspecified: Secondary | ICD-10-CM | POA: Diagnosis not present

## 2013-07-28 DIAGNOSIS — I10 Essential (primary) hypertension: Secondary | ICD-10-CM | POA: Diagnosis not present

## 2013-07-28 DIAGNOSIS — E7800 Pure hypercholesterolemia, unspecified: Secondary | ICD-10-CM | POA: Diagnosis not present

## 2013-09-07 ENCOUNTER — Other Ambulatory Visit: Payer: Self-pay

## 2013-09-07 DIAGNOSIS — K529 Noninfective gastroenteritis and colitis, unspecified: Secondary | ICD-10-CM

## 2013-10-01 DIAGNOSIS — E669 Obesity, unspecified: Secondary | ICD-10-CM | POA: Diagnosis not present

## 2013-10-01 DIAGNOSIS — I10 Essential (primary) hypertension: Secondary | ICD-10-CM | POA: Diagnosis not present

## 2013-10-01 DIAGNOSIS — J449 Chronic obstructive pulmonary disease, unspecified: Secondary | ICD-10-CM | POA: Diagnosis not present

## 2013-10-11 ENCOUNTER — Encounter: Payer: Self-pay | Admitting: Internal Medicine

## 2013-10-11 ENCOUNTER — Telehealth: Payer: Self-pay | Admitting: Internal Medicine

## 2013-10-11 NOTE — Telephone Encounter (Signed)
Pt is on May recall to follow up and to have CBC recheck

## 2013-10-11 NOTE — Telephone Encounter (Signed)
I have her in the box  to send out.

## 2013-11-02 ENCOUNTER — Other Ambulatory Visit (HOSPITAL_COMMUNITY): Payer: Self-pay | Admitting: Internal Medicine

## 2013-11-02 DIAGNOSIS — Z1231 Encounter for screening mammogram for malignant neoplasm of breast: Secondary | ICD-10-CM

## 2013-11-11 ENCOUNTER — Ambulatory Visit (HOSPITAL_COMMUNITY)
Admission: RE | Admit: 2013-11-11 | Discharge: 2013-11-11 | Disposition: A | Discharge status: Home / Self Care | Payer: Medicare Other | Source: Ambulatory Visit | Attending: Internal Medicine | Admitting: Internal Medicine

## 2013-11-11 DIAGNOSIS — Z1231 Encounter for screening mammogram for malignant neoplasm of breast: Secondary | ICD-10-CM | POA: Insufficient documentation

## 2013-12-31 DIAGNOSIS — J45909 Unspecified asthma, uncomplicated: Secondary | ICD-10-CM | POA: Diagnosis not present

## 2013-12-31 DIAGNOSIS — I10 Essential (primary) hypertension: Secondary | ICD-10-CM | POA: Diagnosis not present

## 2013-12-31 DIAGNOSIS — M549 Dorsalgia, unspecified: Secondary | ICD-10-CM | POA: Diagnosis not present

## 2014-03-14 ENCOUNTER — Encounter (HOSPITAL_COMMUNITY): Payer: Self-pay | Admitting: Emergency Medicine

## 2014-03-14 ENCOUNTER — Inpatient Hospital Stay (HOSPITAL_COMMUNITY)
Admission: EM | Admit: 2014-03-14 | Discharge: 2014-03-17 | DRG: 392 | Disposition: A | Discharge status: Home Health | Payer: Medicare Other | Attending: Internal Medicine | Admitting: Internal Medicine

## 2014-03-14 ENCOUNTER — Inpatient Hospital Stay (HOSPITAL_COMMUNITY): Payer: Medicare Other

## 2014-03-14 ENCOUNTER — Emergency Department (HOSPITAL_COMMUNITY): Payer: Medicare Other

## 2014-03-14 DIAGNOSIS — K529 Noninfective gastroenteritis and colitis, unspecified: Secondary | ICD-10-CM | POA: Diagnosis not present

## 2014-03-14 DIAGNOSIS — A09 Infectious gastroenteritis and colitis, unspecified: Secondary | ICD-10-CM | POA: Diagnosis present

## 2014-03-14 DIAGNOSIS — G8929 Other chronic pain: Secondary | ICD-10-CM | POA: Diagnosis present

## 2014-03-14 DIAGNOSIS — E860 Dehydration: Secondary | ICD-10-CM | POA: Diagnosis present

## 2014-03-14 DIAGNOSIS — K8020 Calculus of gallbladder without cholecystitis without obstruction: Secondary | ICD-10-CM | POA: Diagnosis not present

## 2014-03-14 DIAGNOSIS — K922 Gastrointestinal hemorrhage, unspecified: Secondary | ICD-10-CM | POA: Diagnosis present

## 2014-03-14 DIAGNOSIS — E876 Hypokalemia: Secondary | ICD-10-CM | POA: Diagnosis present

## 2014-03-14 DIAGNOSIS — Z955 Presence of coronary angioplasty implant and graft: Secondary | ICD-10-CM | POA: Diagnosis not present

## 2014-03-14 DIAGNOSIS — J811 Chronic pulmonary edema: Secondary | ICD-10-CM | POA: Diagnosis not present

## 2014-03-14 DIAGNOSIS — Z800 Family history of malignant neoplasm of digestive organs: Secondary | ICD-10-CM | POA: Diagnosis not present

## 2014-03-14 DIAGNOSIS — Z803 Family history of malignant neoplasm of breast: Secondary | ICD-10-CM

## 2014-03-14 DIAGNOSIS — R072 Precordial pain: Secondary | ICD-10-CM

## 2014-03-14 DIAGNOSIS — R079 Chest pain, unspecified: Secondary | ICD-10-CM | POA: Diagnosis not present

## 2014-03-14 DIAGNOSIS — Z87891 Personal history of nicotine dependence: Secondary | ICD-10-CM

## 2014-03-14 DIAGNOSIS — J449 Chronic obstructive pulmonary disease, unspecified: Secondary | ICD-10-CM | POA: Diagnosis present

## 2014-03-14 DIAGNOSIS — R1084 Generalized abdominal pain: Secondary | ICD-10-CM

## 2014-03-14 DIAGNOSIS — D72829 Elevated white blood cell count, unspecified: Secondary | ICD-10-CM | POA: Diagnosis present

## 2014-03-14 DIAGNOSIS — Z981 Arthrodesis status: Secondary | ICD-10-CM | POA: Diagnosis not present

## 2014-03-14 DIAGNOSIS — Z87442 Personal history of urinary calculi: Secondary | ICD-10-CM | POA: Diagnosis not present

## 2014-03-14 DIAGNOSIS — I369 Nonrheumatic tricuspid valve disorder, unspecified: Secondary | ICD-10-CM | POA: Diagnosis not present

## 2014-03-14 DIAGNOSIS — R112 Nausea with vomiting, unspecified: Secondary | ICD-10-CM | POA: Diagnosis not present

## 2014-03-14 DIAGNOSIS — I10 Essential (primary) hypertension: Secondary | ICD-10-CM | POA: Diagnosis present

## 2014-03-14 DIAGNOSIS — I25810 Atherosclerosis of coronary artery bypass graft(s) without angina pectoris: Secondary | ICD-10-CM | POA: Diagnosis present

## 2014-03-14 DIAGNOSIS — E8770 Fluid overload, unspecified: Secondary | ICD-10-CM | POA: Diagnosis not present

## 2014-03-14 DIAGNOSIS — Z951 Presence of aortocoronary bypass graft: Secondary | ICD-10-CM | POA: Diagnosis not present

## 2014-03-14 LAB — URINALYSIS, ROUTINE W REFLEX MICROSCOPIC
BILIRUBIN URINE: NEGATIVE
Glucose, UA: NEGATIVE mg/dL
Hgb urine dipstick: NEGATIVE
Leukocytes, UA: NEGATIVE
NITRITE: NEGATIVE
Protein, ur: NEGATIVE mg/dL
UROBILINOGEN UA: 0.2 mg/dL (ref 0.0–1.0)
pH: 5.5 (ref 5.0–8.0)

## 2014-03-14 LAB — CBC WITH DIFFERENTIAL/PLATELET
Basophils Absolute: 0 10*3/uL (ref 0.0–0.1)
Basophils Relative: 0 % (ref 0–1)
EOS PCT: 0 % (ref 0–5)
Eosinophils Absolute: 0 10*3/uL (ref 0.0–0.7)
HEMATOCRIT: 41.3 % (ref 36.0–46.0)
HEMOGLOBIN: 13.5 g/dL (ref 12.0–15.0)
LYMPHS ABS: 1.5 10*3/uL (ref 0.7–4.0)
Lymphocytes Relative: 11 % — ABNORMAL LOW (ref 12–46)
MCH: 24.3 pg — ABNORMAL LOW (ref 26.0–34.0)
MCHC: 32.7 g/dL (ref 30.0–36.0)
MCV: 74.3 fL — AB (ref 78.0–100.0)
MONO ABS: 0.7 10*3/uL (ref 0.1–1.0)
MONOS PCT: 5 % (ref 3–12)
NEUTROS ABS: 11.5 10*3/uL — AB (ref 1.7–7.7)
Neutrophils Relative %: 84 % — ABNORMAL HIGH (ref 43–77)
Platelets: 410 10*3/uL — ABNORMAL HIGH (ref 150–400)
RBC: 5.56 MIL/uL — AB (ref 3.87–5.11)
RDW: 16.6 % — ABNORMAL HIGH (ref 11.5–15.5)
WBC: 13.6 10*3/uL — ABNORMAL HIGH (ref 4.0–10.5)

## 2014-03-14 LAB — BASIC METABOLIC PANEL
Anion gap: 16 — ABNORMAL HIGH (ref 5–15)
BUN: 19 mg/dL (ref 6–23)
CALCIUM: 10.1 mg/dL (ref 8.4–10.5)
CO2: 26 meq/L (ref 19–32)
CREATININE: 0.86 mg/dL (ref 0.50–1.10)
Chloride: 103 mEq/L (ref 96–112)
GFR calc Af Amer: 78 mL/min — ABNORMAL LOW (ref >90)
GFR calc non Af Amer: 67 mL/min — ABNORMAL LOW (ref >90)
GLUCOSE: 109 mg/dL — AB (ref 70–99)
Potassium: 3.9 mEq/L (ref 3.7–5.3)
Sodium: 145 mEq/L (ref 137–147)

## 2014-03-14 LAB — PROTIME-INR
INR: 1 (ref 0.00–1.49)
Prothrombin Time: 13.3 seconds (ref 11.6–15.2)

## 2014-03-14 LAB — TYPE AND SCREEN
ABO/RH(D): B NEG
Antibody Screen: NEGATIVE

## 2014-03-14 LAB — APTT: aPTT: 41 seconds — ABNORMAL HIGH (ref 24–37)

## 2014-03-14 LAB — CBC
HCT: 41.1 % (ref 36.0–46.0)
Hemoglobin: 13.3 g/dL (ref 12.0–15.0)
MCH: 24.2 pg — ABNORMAL LOW (ref 26.0–34.0)
MCHC: 32.4 g/dL (ref 30.0–36.0)
MCV: 74.7 fL — AB (ref 78.0–100.0)
PLATELETS: 345 10*3/uL (ref 150–400)
RBC: 5.5 MIL/uL — ABNORMAL HIGH (ref 3.87–5.11)
RDW: 16.6 % — AB (ref 11.5–15.5)
WBC: 13.4 10*3/uL — AB (ref 4.0–10.5)

## 2014-03-14 LAB — PRO B NATRIURETIC PEPTIDE: Pro B Natriuretic peptide (BNP): 193.6 pg/mL — ABNORMAL HIGH (ref 0–125)

## 2014-03-14 LAB — TROPONIN I

## 2014-03-14 LAB — I-STAT CG4 LACTIC ACID, ED: Lactic Acid, Venous: 1.69 mmol/L (ref 0.5–2.2)

## 2014-03-14 MED ORDER — CIPROFLOXACIN IN D5W 400 MG/200ML IV SOLN
400.0000 mg | Freq: Two times a day (BID) | INTRAVENOUS | Status: DC
  Administered 2014-03-15 – 2014-03-16 (×3): 400 mg via INTRAVENOUS
  Filled 2014-03-14 (×4): qty 200

## 2014-03-14 MED ORDER — SODIUM CHLORIDE 0.9 % IV SOLN
INTRAVENOUS | Status: AC
  Administered 2014-03-14: via INTRAVENOUS

## 2014-03-14 MED ORDER — SODIUM CHLORIDE 0.9 % IV SOLN
INTRAVENOUS | Status: DC
  Administered 2014-03-14 – 2014-03-16 (×4): via INTRAVENOUS

## 2014-03-14 MED ORDER — SODIUM CHLORIDE 0.9 % IV SOLN
Freq: Once | INTRAVENOUS | Status: AC
  Administered 2014-03-14: 18:00:00 via INTRAVENOUS

## 2014-03-14 MED ORDER — METRONIDAZOLE IN NACL 5-0.79 MG/ML-% IV SOLN
500.0000 mg | Freq: Three times a day (TID) | INTRAVENOUS | Status: DC
  Administered 2014-03-14 – 2014-03-16 (×6): 500 mg via INTRAVENOUS
  Filled 2014-03-14 (×6): qty 100

## 2014-03-14 MED ORDER — ONDANSETRON HCL 4 MG/2ML IJ SOLN: 4.0000 mg | Freq: Three times a day (TID) | INTRAMUSCULAR | Status: AC | PRN

## 2014-03-14 MED ORDER — ALBUTEROL SULFATE (2.5 MG/3ML) 0.083% IN NEBU: 2.5000 mg | INHALATION_SOLUTION | Freq: Four times a day (QID) | RESPIRATORY_TRACT | Status: DC | PRN

## 2014-03-14 MED ORDER — ONDANSETRON HCL 4 MG/2ML IJ SOLN
4.0000 mg | Freq: Once | INTRAMUSCULAR | Status: AC
  Administered 2014-03-14: 4 mg via INTRAVENOUS
  Filled 2014-03-14: qty 2

## 2014-03-14 MED ORDER — PANTOPRAZOLE SODIUM 40 MG IV SOLR
40.0000 mg | Freq: Two times a day (BID) | INTRAVENOUS | Status: DC
  Administered 2014-03-14 – 2014-03-17 (×6): 40 mg via INTRAVENOUS
  Filled 2014-03-14 (×7): qty 40

## 2014-03-14 MED ORDER — LEVALBUTEROL TARTRATE 45 MCG/ACT IN AERO
2.0000 | INHALATION_SPRAY | Freq: Four times a day (QID) | RESPIRATORY_TRACT | Status: DC | PRN
  Filled 2014-03-14: qty 15

## 2014-03-14 NOTE — H&P (Addendum)
Hospitalist Admission History and Physical  Patient name: [name] Medical record number: [id number] Date of birth: 02/03/1946 Age: 68 y.o. Gender: female  Primary Care Provider: Avon GullyFANTA,TESFAYE, MD  Chief Complaint: GIB, chest pain.  History of Present Illness:This is a 68 y.o. year old female with significant past medical history of COPD, ischemic colitis, CAD s/p CABG presenting with GIB, chest pain. Patient reports one-day history of nausea, abdominal pain, voluminous diarrhea turned bright red per rectum. Patient noted to have been hospitalized earlier in the year with noted heme positive stools. Had upper endoscopy showed antral erosion. Had colonoscopy done November 2012 that showed small internal hemorrhoids, diverticular disease in the setting of his ischemic colitis that resolved. Patient denies any recent NSAID use. No aspirin. No goody powders. Patient does report lifting some heavy boxes recently from canning. Has had 2-3 episodes of nonbilious nonbloody emesis associated with abdominal pain. States she has some mild intermittent anterior chest pain. Worse with coughing. Present to the ER MAXIMUM TEMPERATURE 100.4, heart rate in the 60s to 70s, blood pressure 1:30/70, satting greater than 94% on room air. White blood cell count 13.6, hemoglobin 13.5, creatinine 0.86, bicarbonate 26. Troponin negative x1. Lactate 1.69. Urinalysis within normal limits. Chest x-ray with mildly increased central pulmonary vascular congestion with questionable bilateral perihilar edema.EKG NSR w/ nonspecific ST and T wave changes-similar to previous. Gross blood on DRE per EDP. Assessment and Plan: Valerie HornMary C Fullbright is a 68 y.o. year old female presenting with GIB, colitis, CP   Active Problems:   Lower GI bleed   GIB (gastrointestinal bleeding)   1- Colitis/GIB  -NPO  -high dose PPI  -IVF  -serial CBCs -type and screen  -cipro and flagyl for GI coverage given low grade temp and leukocytosis -stool  studies  -CT abd and pelvis w/ oral contrast given palpable abd pain  -ICU overnight  -GI consult in am   2-CP  -likely secondary to GI etiology-sxs fairly atypical  -trop neg x1 -EKG WNL  -cycle CEs -noted ? pulm vascular congestion on CXR -check pro BNP and 2D ECHO  -clinically euvolemic  -gentle hydration if pro BNP markedly elevated   -3-COPD  -stable  -cont home regimen    FEN/GI: NPO. PPI IV  Prophylaxis: SCDs  Disposition: pending further evaluation  Code Status:Full Code    Patient Active Problem List   Diagnosis Date Noted  . Lower GI bleed 03/14/2014  . GIB (gastrointestinal bleeding) 03/14/2014  . COPD with acute exacerbation 06/17/2013  . Colitis 06/03/2011    Class: Hospitalized for   Past Medical History: Past Medical History  Diagnosis Date  . Hypertension   . GI bleed Nov 2012    ischemic colitis  . Colitis, ischemic 03/2011  . Kidney stone   . Gallstones   . Multiple pulmonary nodules 03/2011  . Chronic back pain   . COPD (chronic obstructive pulmonary disease)   . Hiatal hernia   . Diverticulosis     Past Surgical History: Past Surgical History  Procedure Laterality Date  . Spinal fusion      x3  . Salpingoophorectomy      left  . Coronary stent placement  1995  . Coronary artery bypass graft  1998  . Colonoscopy  04/16/2011    Dr. Darrick PennaFields: moderate diverticulosis, small internal hemorrhoids, likely ischemic colitis  . Abdominal hysterectomy    . Small intestine surgery      ?adhesive disease? patient states my intestines were "stuck to my  womb"  . Esophagogastroduodenoscopy N/A 06/23/2013    Procedure: ESOPHAGOGASTRODUODENOSCOPY (EGD);  Surgeon: Corbin Adeobert M Rourk, MD;  Location: [facility];  Service: Endoscopy;  Laterality: N/A;    Social History: History   Social History  . Marital Status: Divorced    Spouse Name: N/A    Number of Children: 0  . Years of Education: N/A   Occupational History  . disabled    Social  History Main Topics  . Smoking status: Former Smoker -- 1.00 packs/day for 40 years    Types: Cigarettes    Quit date: 09/14/2010  . Smokeless tobacco: None  . Alcohol Use: No  . Drug Use: No     Comment: marijuana (QUIT couple yrs ago)  . Sexual Activity: None   Other Topics Concern  . None   Social History Narrative   Lives alone    Family History: Family History  Problem Relation Age of Onset  . Stomach cancer Mother 4069    ? believes it was stomach  . Bone cancer Sister 667  . Breast cancer Sister 4667  . Colon cancer Neg Hx     Allergies: Allergies  Allergen Reactions  . Omnipaque [Iohexol] Other (See Comments)    blisters  . Penicillins Other (See Comments)    Unknown reaction    Current Facility-Administered Medications  Medication Dose Route Frequency Provider Last Rate Last Dose  . 0.9 %  sodium chloride infusion   Intravenous Continuous Doree AlbeeSteven Juan Kissoon, MD      . ciprofloxacin (CIPRO) IVPB 400 mg  400 mg Intravenous Q12H Doree AlbeeSteven Jeraldin Fesler, MD      . levalbuterol [facility](XOPENEX HFA) inhaler 2 puff  2 puff Inhalation Q6H PRN Doree AlbeeSteven Idan Prime, MD      . metroNIDAZOLE (FLAGYL) IVPB 500 mg  500 mg Intravenous Q8H Doree AlbeeSteven Jaelin Devincentis, MD      . pantoprazole (PROTONIX) injection 40 mg  40 mg Intravenous Q12H Doree AlbeeSteven Yuri Flener, MD       Current Outpatient Prescriptions  Medication Sig Dispense Refill  . Ascorbic Acid (VITAMIN C) 1000 MG tablet Take 2,000 mg by mouth daily.      Marland Kitchen. atorvastatin (LIPITOR) 40 MG tablet Take 1 tablet by mouth every evening.      . cholecalciferol (VITAMIN D) 1000 UNITS tablet Take 1,000 Units by mouth daily.        Marland Kitchen. lisinopril (PRINIVIL,ZESTRIL) 10 MG tablet Take 10 mg by mouth daily.        . metoprolol succinate (TOPROL-XL) 25 MG 24 hr tablet Take 25 mg by mouth daily.        . pantoprazole (PROTONIX) 40 MG tablet Take 1 tablet (40 mg total) by mouth 2 (two) times daily.  60 tablet  3  . traMADol-acetaminophen (ULTRACET) 37.5-325 MG per tablet Take 1 tablet by  mouth 2 (two) times daily.      Marland Kitchen. levalbuterol (XOPENEX HFA) 45 MCG/ACT inhaler Inhale 2 puffs into the lungs every 6 (six) hours as needed for wheezing.  1 Inhaler  12   Review Of Systems: 12 point ROS negative except as noted above in HPI.  Physical Exam: Filed Vitals:   03/14/14 1953  BP:   Pulse:   Temp:   Resp: 18    General: alert and cooperative HEENT: PERRLA and extra ocular movement intact Heart: S1, S2 normal, no murmur, rub or gallop, regular rate and rhythm Lungs: clear to auscultation, no wheezes or rales and unlabored breathing Abdomen:+ bowel sounds, mild to moderate reproducible TTP  on exam  Extremities: extremities normal, atraumatic, no cyanosis or edema Skin:no rashes, no ecchymoses Neurology: normal without focal findings  Labs and Imaging: Lab Results  Component Value Date/Time   NA 145 03/14/2014  5:25 PM   K 3.9 03/14/2014  5:25 PM   CL 103 03/14/2014  5:25 PM   CO2 26 03/14/2014  5:25 PM   BUN 19 03/14/2014  5:25 PM   CREATININE 0.86 03/14/2014  5:25 PM   GLUCOSE 109* 03/14/2014  5:25 PM   Lab Results  Component Value Date   WBC 13.6* 03/14/2014   HGB 13.5 03/14/2014   HCT 41.3 03/14/2014   MCV 74.3* 03/14/2014   PLT 410* 03/14/2014   Urinalysis    Component Value Date/Time   COLORURINE YELLOW 03/14/2014 1843   APPEARANCEUR CLEAR 03/14/2014 1843   LABSPEC >1.030* 03/14/2014 1843   PHURINE 5.5 03/14/2014 1843   GLUCOSEU NEGATIVE 03/14/2014 1843   HGBUR NEGATIVE 03/14/2014 1843   BILIRUBINUR NEGATIVE 03/14/2014 1843   KETONESUR TRACE* 03/14/2014 1843   PROTEINUR NEGATIVE 03/14/2014 1843   UROBILINOGEN 0.2 03/14/2014 1843   NITRITE NEGATIVE 03/14/2014 1843   LEUKOCYTESUR NEGATIVE 03/14/2014 1843       Dg Chest Port 1 View  03/14/2014   CLINICAL DATA:  Acute chest pain.  EXAM: PORTABLE CHEST - 1 VIEW  COMPARISON:  June 17, 2013.  FINDINGS: Stable cardiomegaly. Sternotomy wires are noted. Mildly increased central pulmonary vascular  congestion is noted with possible bilateral perihilar edema. No pneumothorax or significant pleural effusion is noted. Bony thorax is intact.  IMPRESSION: Mildly increased central pulmonary vascular congestion with possible bilateral perihilar edema.   Electronically Signed   By: Roque Lias M.D.   On: 03/14/2014 17:50           Doree Albee MD  Pager: [phone]

## 2014-03-14 NOTE — ED Notes (Signed)
Patient complaining of vomiting starting at 0000 last night with chest pain starting approximately 0200 this morning. Also complaining of diarrhea with "passing blood in my stool."

## 2014-03-14 NOTE — Progress Notes (Signed)
eLink Physician-Brief Progress Note Patient Name: [name] DOB: 05/21/1946 MRN: [id number]   Date of Service  03/14/2014  HPI/Events of Note  [age] F admitted to ICU with presentation of lower GIB and colitis on CT scan.  PMH is sign for COPD/HTN/GIB.  She is currently stable on protonix and abx.  Plan for GI consult in AM.  On camera check the patient is alert in NAD, HD stable with IVFs running at 150 cc/hrl  eICU Interventions  Plan: Continue to monitor [facility] per primary team     Intervention Category Evaluation Type: New Patient Evaluation  Kenichi Cassada 03/14/2014, 11:56 PM

## 2014-03-14 NOTE — ED Notes (Signed)
Patient states that if she lays still that she does not hurt.

## 2014-03-14 NOTE — ED Notes (Signed)
Patient assisted to restroom. Patient spitting in the trash can. Family at bedside.

## 2014-03-14 NOTE — ED Notes (Signed)
Patient asked for drink of water, EDP gave OK for pt to drink at this time.

## 2014-03-14 NOTE — ED Provider Notes (Signed)
CSN: [id number]     Arrival date & time 03/14/14  1558 History   First MD Initiated Contact with Patient 03/14/14 1705     Chief Complaint  Patient presents with  . Chest Pain  . Emesis  . Rectal Bleeding     (Consider location/radiation/quality/duration/timing/severity/associated sxs/prior Treatment) HPI Comments: The patient is a 68 year old female, history of hypertension, COPD, chronic back pain who also has a history of ischemic colitis in 2012 and a GI bleed at that time. She was admitted in January of this year at which time she had some GI bleeding in the hospital, and endoscopy showed some erosions of the antrum but no active bleeding and the patient had no drop in her hemoglobin. She states that it approximately 17 hours ago at midnight she developed nausea vomiting and diarrhea which has been persistent throughout the last 17 hours, the diarrhea started out as loose stools but has since become blood stained his stools and then just pure bright red blood. She continues to have vomiting which is now just stomach acid. She denies any significant abdominal pain except for when she vomits when this causes some abdominal pain. She has been unable to tolerate oral fluids including clear liquids and water. She denies coughing or shortness of breath, she does have chest pain that happens when she vomits. She denies any sick contacts recent travel or antibiotic use areas she denies back pain, swelling of the legs or headache. No medications given prior to arrival  Patient is a 68 y.o. female presenting with chest pain, vomiting, and hematochezia. The history is provided by the patient.  Chest Pain Associated symptoms: vomiting   Emesis Rectal Bleeding Associated symptoms: vomiting     Past Medical History  Diagnosis Date  . Hypertension   . GI bleed Nov 2012    ischemic colitis  . Colitis, ischemic 03/2011  . Kidney stone   . Gallstones   . Multiple pulmonary nodules 03/2011  .  Chronic back pain   . COPD (chronic obstructive pulmonary disease)   . Hiatal hernia   . Diverticulosis    Past Surgical History  Procedure Laterality Date  . Spinal fusion      x3  . Salpingoophorectomy      left  . Coronary stent placement  1995  . Coronary artery bypass graft  1998  . Colonoscopy  04/16/2011    Dr. Darrick Penna: moderate diverticulosis, small internal hemorrhoids, likely ischemic colitis  . Abdominal hysterectomy    . Small intestine surgery      ?adhesive disease? patient states my intestines were "stuck to my womb"  . Esophagogastroduodenoscopy N/A 06/23/2013    Procedure: ESOPHAGOGASTRODUODENOSCOPY (EGD);  Surgeon: Valerie Ade, MD;  Location: [facility];  Service: Endoscopy;  Laterality: N/A;   Family History  Problem Relation Age of Onset  . Stomach cancer Mother 7    ? believes it was stomach  . Bone cancer Sister 67  . Breast cancer Sister 54  . Colon cancer Neg Hx    History  Substance Use Topics  . Smoking status: Former Smoker -- 1.00 packs/day for 40 years    Types: Cigarettes    Quit date: 09/14/2010  . Smokeless tobacco: Not on file  . Alcohol Use: No   OB History   Grav Para Term Preterm Abortions TAB SAB Ect Mult Living                 Review of Systems  Cardiovascular: Positive  for chest pain.  Gastrointestinal: Positive for vomiting and hematochezia.  All other systems reviewed and are negative.     Allergies  Omnipaque and Penicillins  Home Medications   Prior to Admission medications   Medication Sig Start Date End Date Taking? Authorizing Provider  Ascorbic Acid (VITAMIN C) 1000 MG tablet Take 2,000 mg by mouth daily.   Yes Historical Provider, MD  atorvastatin (LIPITOR) 40 MG tablet Take 1 tablet by mouth every evening. 04/24/13  Yes Historical Provider, MD  cholecalciferol (VITAMIN D) 1000 UNITS tablet Take 1,000 Units by mouth daily.     Yes Historical Provider, MD  lisinopril (PRINIVIL,ZESTRIL) 10 MG tablet Take 10  mg by mouth daily.     Yes Historical Provider, MD  metoprolol succinate (TOPROL-XL) 25 MG 24 hr tablet Take 25 mg by mouth daily.     Yes Historical Provider, MD  pantoprazole (PROTONIX) 40 MG tablet Take 1 tablet (40 mg total) by mouth 2 (two) times daily. 06/24/13  Yes Valerie Gullyesfaye Fanta, MD  traMADol-acetaminophen (ULTRACET) 37.5-325 MG per tablet Take 1 tablet by mouth 2 (two) times daily.   Yes Historical Provider, MD  levalbuterol [facility](XOPENEX HFA) 45 MCG/ACT inhaler Inhale 2 puffs into the lungs every 6 (six) hours as needed for wheezing. 06/24/13   Valerie Gullyesfaye Fanta, MD   BP 152/69  Pulse 71  Temp(Src) 98 F (36.7 C) (Oral)  Resp 22  Ht 5\' 3"  (1.6 m)  Wt 220 lb (99.791 kg)  BMI 38.98 kg/m2  SpO2 100% Physical Exam  Nursing note and vitals reviewed. Constitutional: She appears well-developed and well-nourished. No distress.  HENT:  Head: Normocephalic and atraumatic.  Mouth/Throat: Oropharynx is clear and moist. No oropharyngeal exudate.  Eyes: Conjunctivae and EOM are normal. Pupils are equal, round, and reactive to light. Right eye exhibits no discharge. Left eye exhibits no discharge. No scleral icterus.  Neck: Normal range of motion. Neck supple. No JVD present. No thyromegaly present.  Cardiovascular: Normal rate, regular rhythm, normal heart sounds and intact distal pulses.  Exam reveals no gallop and no friction rub.   No murmur heard. Pulmonary/Chest: Effort normal and breath sounds normal. No respiratory distress. She has no wheezes. She has no rales.  Abdominal: Soft. Bowel sounds are normal. She exhibits no distension and no mass. There is no tenderness.  Genitourinary:  Chaperone present for rectal exam, no hemorrhoids fissures, no internal masses tenderness or stool, bright red blood present in the rectal vault  Musculoskeletal: Normal range of motion. She exhibits no edema and no tenderness.  Lymphadenopathy:    She has no cervical adenopathy.  Neurological: She is alert.  Coordination normal.  Skin: Skin is warm and dry. No rash noted. No erythema.  Psychiatric: She has a normal mood and affect. Her behavior is normal.    ED Course  Procedures (including critical care time) Labs Review Labs Reviewed  CBC WITH DIFFERENTIAL - Abnormal; Notable for the following:    WBC 13.6 (*)    RBC 5.56 (*)    MCV 74.3 (*)    MCH 24.3 (*)    RDW 16.6 (*)    Platelets 410 (*)    Neutrophils Relative % 84 (*)    Neutro Abs 11.5 (*)    Lymphocytes Relative 11 (*)    All other components within normal limits  BASIC METABOLIC PANEL - Abnormal; Notable for the following:    Glucose, Bld 109 (*)    GFR calc non Af Amer 67 (*)  GFR calc Af Amer 78 (*)    Anion gap 16 (*)    All other components within normal limits  APTT - Abnormal; Notable for the following:    aPTT 41 (*)    All other components within normal limits  URINALYSIS, ROUTINE W REFLEX MICROSCOPIC - Abnormal; Notable for the following:    Specific Gravity, Urine >1.030 (*)    Ketones, ur TRACE (*)    All other components within normal limits  PROTIME-INR  TROPONIN I  I-STAT CG4 LACTIC ACID, ED    Imaging Review Dg Chest Port 1 View  03/14/2014   CLINICAL DATA:  Acute chest pain.  EXAM: PORTABLE CHEST - 1 VIEW  COMPARISON:  June 17, 2013.  FINDINGS: Stable cardiomegaly. Sternotomy wires are noted. Mildly increased central pulmonary vascular congestion is noted with possible bilateral perihilar edema. No pneumothorax or significant pleural effusion is noted. Bony thorax is intact.  IMPRESSION: Mildly increased central pulmonary vascular congestion with possible bilateral perihilar edema.   Electronically Signed   By: Roque LiasJames  Green M.D.   On: 03/14/2014 17:50     EKG Interpretation   Date/Time:  Monday March 14 2014 16:35:28 EDT Ventricular Rate:  75 PR Interval:  126 QRS Duration: 88 QT Interval:  376 QTC Calculation: 419 R Axis:   44 Text Interpretation:  Normal sinus rhythm Nonspecific  T wave abnormality  Abnormal ECG Since last tracing T wave inversion RESOLVED SINCE PREVIOUS  Confirmed by Hyacinth MeekerMILLER  MD, Tenley Winward ([id number]) on 03/14/2014 4:50:59 PM      MDM   Final diagnoses:  Lower GI bleed    The patient is mildly hypertensive, she has a normal looking EKG, her gastrointestinal symptoms could be consistent with having a GI bleed, a viral enteritis, I am concerned that she has a rectum containing bright red blood. Labs pending, fluids, antiemetics, lactic acid.  Hemoglobin and normal range, the patient does have a low-grade fever but urinalysis does not show infection, this may be related to a colitis with her nausea vomiting and diarrhea. At this time she does have hematochezia, I have discussed her care with the hospitalist Dr. Alvester MorinNewton who will admit her to the hospital. She does not need a blood transfusion at this time is stable for medical surgical bed.    Valerie RollerBrian D Jnai Snell[place]ve, MD 03/14/14 [phone]

## 2014-03-15 ENCOUNTER — Encounter (HOSPITAL_COMMUNITY): Payer: Self-pay | Admitting: Gastroenterology

## 2014-03-15 DIAGNOSIS — J449 Chronic obstructive pulmonary disease, unspecified: Secondary | ICD-10-CM | POA: Diagnosis present

## 2014-03-15 DIAGNOSIS — I369 Nonrheumatic tricuspid valve disorder, unspecified: Secondary | ICD-10-CM

## 2014-03-15 DIAGNOSIS — R079 Chest pain, unspecified: Secondary | ICD-10-CM | POA: Diagnosis present

## 2014-03-15 LAB — CBC
HCT: 37.8 % (ref 36.0–46.0)
HCT: 35.5 % — ABNORMAL LOW (ref 36.0–46.0)
HEMOGLOBIN: 11.9 g/dL — AB (ref 12.0–15.0)
Hemoglobin: 11.2 g/dL — ABNORMAL LOW (ref 12.0–15.0)
MCH: 23.8 pg — AB (ref 26.0–34.0)
MCH: 23.9 pg — ABNORMAL LOW (ref 26.0–34.0)
MCHC: 31.5 g/dL (ref 30.0–36.0)
MCHC: 31.5 g/dL (ref 30.0–36.0)
MCV: 75.8 fL — ABNORMAL LOW (ref 78.0–100.0)
MCV: 75.9 fL — AB (ref 78.0–100.0)
PLATELETS: 298 10*3/uL (ref 150–400)
Platelets: 321 10*3/uL (ref 150–400)
RBC: 4.99 MIL/uL (ref 3.87–5.11)
RBC: 4.68 MIL/uL (ref 3.87–5.11)
RDW: 16.8 % — AB (ref 11.5–15.5)
RDW: 16.8 % — AB (ref 11.5–15.5)
WBC: 11.7 10*3/uL — ABNORMAL HIGH (ref 4.0–10.5)
WBC: 10.8 10*3/uL — AB (ref 4.0–10.5)

## 2014-03-15 LAB — COMPREHENSIVE METABOLIC PANEL
ALT: 8 U/L (ref 0–35)
AST: 17 U/L (ref 0–37)
Albumin: 3.5 g/dL (ref 3.5–5.2)
Alkaline Phosphatase: 65 U/L (ref 39–117)
Anion gap: 15 (ref 5–15)
BUN: 14 mg/dL (ref 6–23)
CALCIUM: 8.8 mg/dL (ref 8.4–10.5)
CO2: 22 mEq/L (ref 19–32)
CREATININE: 0.84 mg/dL (ref 0.50–1.10)
Chloride: 104 mEq/L (ref 96–112)
GFR, EST AFRICAN AMERICAN: 80 mL/min — AB (ref >90)
GFR, EST NON AFRICAN AMERICAN: 69 mL/min — AB (ref >90)
GLUCOSE: 120 mg/dL — AB (ref 70–99)
Potassium: 3.6 mEq/L — ABNORMAL LOW (ref 3.7–5.3)
SODIUM: 141 meq/L (ref 137–147)
TOTAL PROTEIN: 6.2 g/dL (ref 6.0–8.3)
Total Bilirubin: 0.4 mg/dL (ref 0.3–1.2)

## 2014-03-15 LAB — CBC WITH DIFFERENTIAL/PLATELET
Basophils Absolute: 0 10*3/uL (ref 0.0–0.1)
Basophils Relative: 0 % (ref 0–1)
EOS ABS: 0 10*3/uL (ref 0.0–0.7)
Eosinophils Relative: 0 % (ref 0–5)
HCT: 36.9 % (ref 36.0–46.0)
HEMOGLOBIN: 11.8 g/dL — AB (ref 12.0–15.0)
LYMPHS ABS: 1.8 10*3/uL (ref 0.7–4.0)
LYMPHS PCT: 13 % (ref 12–46)
MCH: 24 pg — AB (ref 26.0–34.0)
MCHC: 32 g/dL (ref 30.0–36.0)
MCV: 75 fL — ABNORMAL LOW (ref 78.0–100.0)
MONOS PCT: 7 % (ref 3–12)
Monocytes Absolute: 1 10*3/uL (ref 0.1–1.0)
NEUTROS ABS: 10.5 10*3/uL — AB (ref 1.7–7.7)
NEUTROS PCT: 80 % — AB (ref 43–77)
PLATELETS: 324 10*3/uL (ref 150–400)
RBC: 4.92 MIL/uL (ref 3.87–5.11)
RDW: 16.7 % — ABNORMAL HIGH (ref 11.5–15.5)
WBC: 13.3 10*3/uL — AB (ref 4.0–10.5)

## 2014-03-15 LAB — MRSA PCR SCREENING: MRSA BY PCR: NEGATIVE

## 2014-03-15 LAB — TROPONIN I

## 2014-03-15 LAB — OCCULT BLOOD, POC DEVICE: FECAL OCCULT BLD: POSITIVE — AB

## 2014-03-15 NOTE — Consult Note (Addendum)
Referring Provider: Dr. Alvester MorinNewton Primary Care Physician:  Avon GullyFANTA,TESFAYE, MD Primary Gastroenterologist:  Dr. Jena Gaussourk   Date of Admission: 03/14/14 Date of Consultation: 03/15/14  Reason for Consultation:  Lower GI bleed, colitis  HPI:  [name] is a pleasant 68 year old female who presented to the ED secondary to rectal bleeding. She is well-known to our practice from a prior admission in Jan 2015 with melena. EGD at that time with cervical esophageal web s/p disruption with scope, large hiatal hernia with Sheria Langameron lesions, chronic active gastritis. Last colonoscopy in 2012 with moderate diverticulosis, small internal hemorrhoids, likely ischemic colitis.   Notes acute onset of nausea, vomiting, and diarrhea prior to admission. States this lasted at least 12 hours. Rectal bleeding commenced after multiple episodes of loose stool. Notes several episodes during the night of rectal bleeding; however, she feels the amount is less and becoming "lighter" in volume. Denies any associated abdominal pain with presentation. No further vomiting. Mild nausea this morning. Endorsed chills and temp of 100.4 on admission. States she has been on antibiotics prior to admission but unsure the name. No sick contacts. Chronic lower back pain that radiates around right side. Worsened with movement. No NSAIDs, aspirin powders.   Past Medical History  Diagnosis Date  . Hypertension   . GI bleed Nov 2012    ischemic colitis  . Colitis, ischemic 03/2011  . Kidney stone   . Gallstones   . Multiple pulmonary nodules 03/2011  . Chronic back pain   . COPD (chronic obstructive pulmonary disease)   . Hiatal hernia   . Diverticulosis     Past Surgical History  Procedure Laterality Date  . Spinal fusion      x3  . Salpingoophorectomy      left  . Coronary stent placement  1995  . Coronary artery bypass graft  1998  . Colonoscopy  04/16/2011    Dr. Darrick PennaFields: moderate diverticulosis, small internal hemorrhoids,  likely ischemic colitis  . Abdominal hysterectomy    . Small intestine surgery      ?adhesive disease? patient states my intestines were "stuck to my womb"  . Esophagogastroduodenoscopy N/A 06/23/2013    Procedure: ESOPHAGOGASTRODUODENOSCOPY (EGD);  Surgeon: Corbin Adeobert M Rourk, MD;  Location: [facility];  Service: Endoscopy;  Laterality: N/A;    Prior to Admission medications   Medication Sig Start Date End Date Taking? Authorizing Provider  Ascorbic Acid (VITAMIN C) 1000 MG tablet Take 2,000 mg by mouth daily.   Yes Historical Provider, MD  atorvastatin (LIPITOR) 40 MG tablet Take 1 tablet by mouth every evening. 04/24/13  Yes Historical Provider, MD  cholecalciferol (VITAMIN D) 1000 UNITS tablet Take 1,000 Units by mouth daily.     Yes Historical Provider, MD  lisinopril (PRINIVIL,ZESTRIL) 10 MG tablet Take 10 mg by mouth daily.     Yes Historical Provider, MD  metoprolol succinate (TOPROL-XL) 25 MG 24 hr tablet Take 25 mg by mouth daily.     Yes Historical Provider, MD  pantoprazole (PROTONIX) 40 MG tablet Take 1 tablet (40 mg total) by mouth 2 (two) times daily. 06/24/13  Yes Avon Gullyesfaye Fanta, MD  traMADol-acetaminophen (ULTRACET) 37.5-325 MG per tablet Take 1 tablet by mouth 2 (two) times daily.   Yes Historical Provider, MD  levalbuterol [facility](XOPENEX HFA) 45 MCG/ACT inhaler Inhale 2 puffs into the lungs every 6 (six) hours as needed for wheezing. 06/24/13   Avon Gullyesfaye Fanta, MD    Current Facility-Administered Medications  Medication Dose Route Frequency Provider Last Rate Last  Dose  . 0.9 %  sodium chloride infusion   Intravenous STAT Vida RollerBrian D Miller, MD 150 mL/hr at 03/15/14 0800    . 0.9 %  sodium chloride infusion   Intravenous Continuous Doree AlbeeSteven Newton, MD 100 mL/hr at 03/15/14 [phone]    . albuterol (PROVENTIL) (2.5 MG/3ML) 0.083% nebulizer solution 2.5 mg  2.5 mg Nebulization Q6H PRN Vida RollerBrian D Miller, MD      . ciprofloxacin (CIPRO) IVPB 400 mg  400 mg Intravenous Q12H Doree AlbeeSteven Newton, MD 200 mL/hr at  03/15/14 0814 400 mg at 03/15/14 0814  . metroNIDAZOLE (FLAGYL) IVPB 500 mg  500 mg Intravenous Q8H Doree AlbeeSteven Newton, MD 100 mL/hr at 03/15/14 0420 500 mg at 03/15/14 0420  . ondansetron (ZOFRAN) injection 4 mg  4 mg Intravenous Q8H PRN Vida RollerBrian D Miller, MD      . pantoprazole (PROTONIX) injection 40 mg  40 mg Intravenous Q12H Doree AlbeeSteven Newton, MD   40 mg at 03/14/14 2128    Allergies as of 03/14/2014 - Review Complete 03/14/2014  Allergen Reaction Noted  . Omnipaque [iohexol] Other (See Comments) 09/19/2010  . Penicillins Other (See Comments) 09/19/2010    Family History  Problem Relation Age of Onset  . Stomach cancer Mother [id number]    ? believes it was stomach  . Bone cancer Sister 6567  . Breast cancer Sister 1167  . Colon cancer Neg Hx     History   Social History  . Marital Status: Divorced    Spouse Name: N/A    Number of Children: 0  . Years of Education: N/A   Occupational History  . disabled    Social History Main Topics  . Smoking status: Former Smoker -- 1.00 packs/day for 40 years    Types: Cigarettes    Quit date: 09/14/2010  . Smokeless tobacco: Not on file  . Alcohol Use: No  . Drug Use: No     Comment: marijuana (QUIT couple yrs ago)  . Sexual Activity: Not on file   Other Topics Concern  . Not on file   Social History Narrative   Lives alone    Review of Systems: As mentioned in HPI  Physical Exam: Vital signs in last 24 hours: Temp:  [97.5 F (36.4 C)-100.4 F (38 C)] 99 F (37.2 C) (10/20 0737) Pulse Rate:  [63-83] 70 (10/20 0800) Resp:  [12-24] 20 (10/20 0800) BP: (120-185)/(40-112) 155/68 mmHg (10/20 0700) SpO2:  [90 %-100 %] 95 % (10/20 0800) Weight:  [207 lb 14.3 oz (94.3 kg)-220 lb (99.791 kg)] 207 lb 14.3 oz (94.3 kg) (10/19 2311) Last BM Date: 03/14/14 General:   Alert,  Well-developed, well-nourished, pleasant and cooperative in NAD Head:  Normocephalic and atraumatic. Eyes:  Sclera clear, no icterus.   Conjunctiva pink. Ears:  Normal  auditory acuity. Nose:  No deformity, discharge,  or lesions. Mouth:  No deformity or lesions Lungs:  Clear throughout to auscultation.   No wheezes, crackles, or rhonchi. No acute distress. Heart: S1 S2 present without murmurs Abdomen:  Soft, nontender and nondistended. No masses, hepatosplenomegaly or hernias noted. Normal bowel sounds, without guarding, and without rebound.   Rectal:  Deferred  Msk:  Symmetrical without gross deformities. Normal posture. Extremities:  Trace pedal edema Neurologic:  Alert and  oriented x4;  grossly normal neurologically. Skin:  Intact without significant lesions or rashes. Psych:  Alert and cooperative. Normal mood and affect.  Intake/Output from previous day: 10/19 0701 - 10/20 0700 In: 2505.3 [I.V.:2105.3; IV Piggyback:400] Out: 803 [Urine:801;  Stool:2] Intake/Output this shift: Total I/O In: 350 [I.V.:150; IV Piggyback:200] Out: 100 [Urine:100]  Lab Results:  Recent Labs  03/14/14 1725 03/14/14 2022 03/15/14 0154  WBC 13.6* 13.4* 13.3*  HGB 13.5 13.3 11.8*  HCT 41.3 41.1 36.9  PLT 410* 345 324   BMET  Recent Labs  03/14/14 1725 03/15/14 0154  NA 145 141  K 3.9 3.6*  CL 103 104  CO2 26 22  GLUCOSE 109* 120*  BUN 19 14  CREATININE 0.86 0.84  CALCIUM 10.1 8.8   LFT  Recent Labs  03/15/14 0154  PROT 6.2  ALBUMIN 3.5  AST 17  ALT 8  ALKPHOS 65  BILITOT 0.4   PT/INR  Recent Labs  03/14/14 1725  LABPROT 13.3  INR 1.00    Studies/Results: Ct Abdomen Pelvis Wo Contrast  03/14/2014   CLINICAL DATA:  One day history of nausea, abdominal pain, and diarrhea. Bright red bleeding per rectum. Recent history of lifting heavy boxes. Emesis. Anterior chest pain.  EXAM: CT ABDOMEN AND PELVIS WITHOUT CONTRAST  TECHNIQUE: Multidetector CT imaging of the abdomen and pelvis was performed following the standard protocol without IV contrast.  COMPARISON:  04/08/2011  FINDINGS: 4 mm subpleural nodule in the right middle lung  anteriorly is unchanged since prior study. Given the long interval, this is likely benign. Mild dependent changes in the lung bases. Moderate-sized esophageal hiatal hernia.  Vague increased density in the gallbladder suggesting minimally radiopaque stones. No inflammatory infiltration. The unenhanced appearance of the liver, spleen, pancreas, adrenal glands, kidneys, inferior vena cava, and retroperitoneal lymph nodes is unremarkable. Calcification of abdominal aorta without aneurysm. No gastric wall thickening. Small bowel appear unremarkable without evidence of dilatation or wall thickening. Contrast material flows to the colon without evidence of obstruction. Diffusely stool-filled colon without distention. The splenic flexure and descending colon demonstrate mild colonic wall thickening with pericolonic infiltration suggesting colitis of nonspecific etiology. Similar changes were present on the previous study. No abscess. No free air or free fluid in the abdomen.  Pelvis: Diverticulosis of the sigmoid colon without evidence of diverticulitis. Uterus and ovaries are not enlarged. Bladder is decompressed. Appendix is normal. No free or loculated pelvic fluid collections. No pelvic mass or lymphadenopathy. Degenerative changes in the lumbar spine. No destructive bone lesions.  IMPRESSION: Wall thickening and pericolonic infiltration involving the descending colon most likely representing infectious or inflammatory colitis. Ischemic colitis or diverticulitis less likely. Sigmoid diverticula without diverticulitis. Moderate-sized esophageal hiatal hernia. Cholelithiasis.   Electronically Signed   By: Burman Nieves M.D.   On: 03/14/2014 23:00   Dg Chest Port 1 View  03/14/2014   CLINICAL DATA:  Acute chest pain.  EXAM: PORTABLE CHEST - 1 VIEW  COMPARISON:  June 17, 2013.  FINDINGS: Stable cardiomegaly. Sternotomy wires are noted. Mildly increased central pulmonary vascular congestion is noted with possible  bilateral perihilar edema. No pneumothorax or significant pleural effusion is noted. Bony thorax is intact.  IMPRESSION: Mildly increased central pulmonary vascular congestion with possible bilateral perihilar edema.   Electronically Signed   By: Roque Lias M.D.   On: 03/14/2014 17:50    Impression: 68 year old female admitted with acute onset of nausea, vomiting, and diarrhea, followed by rectal bleeding; drift in Hgb of 2 grams noted since admission. CT with evidence of wall-thickening involved the descending colon. Symptoms appear to be more infectious in nature, with ischemic colitis less likely but remaining in the differential. She denies any use of NSAIDs, aspirin powders. Clinically, she  has improved from admission. Although she still notes rectal bleeding, frequency and amount is tapering. Stool studies ordered but not collected due to lack of diarrhea. As of note, last colonoscopy in 2012 with moderate diverticulosis, small internal hemorrhoids, likely ischemic colitis. If concern for ischemic colitis remains, would consider evaluating mesenteric vasculature. For now, appears to be clinically improving with supportive measures and empiric antibiotics. Monitor for worsening of rectal bleeding or persistent drop in Hgb.   Plan: Clear liquids Cipro and Flagyl empirically Stool studies if recurrent diarrhea Serial H/H Monitor for worsening of rectal bleeding PPI BID  Will continue to follow with you.  Nira Retort, ANP-BC [facility]    LOS: 1 day    03/15/2014, 9:30 AM    Attending note:  Patient seen and examined. No stools/bleeding so far today. Abdominal pain improved. He has rapidly improved since admission.  Ischemic or segmental colitis remains in the differential. Agree with completing a course of Cipro and Flagyl. If she continues to do well, would consider advancing to a low-residue diet by tomorrow morning.

## 2014-03-15 NOTE — Progress Notes (Signed)
  Echocardiogram 2D Echocardiogram has been performed.  Cristofer Yaffe 03/15/2014, 3:29 PM

## 2014-03-15 NOTE — Plan of Care (Signed)
Problem: Consults Goal: GI Bleeding Patient Education See Patient Education Module for education specifics.  Outcome: Progressing Pt given Bloody stool information& education sheets.  Pt says she has had black tarry stools but this is the first time bloody stools  Problem: Phase I Progression Outcomes Goal: Pain controlled with appropriate interventions Outcome: Progressing Pt is able to sleep w/o any complaints Goal: Voiding-avoid urinary catheter unless indicated Outcome: [facility] Goal: Hemodynamically stable Outcome: Progressing Vital signs are stable hr/sr 69  sat94 on R/A  BP133/69 resp22  hgb13.3  Problem: Phase II Progression Outcomes Goal: No active bleeding Outcome: Not Progressing ED reported pt had blood in stool.  No stool  yet seen in ICU.

## 2014-03-15 NOTE — Plan of Care (Signed)
Problem: Phase II Progression Outcomes Goal: No active bleeding Outcome: Progressing Pt given information about colitis

## 2014-03-15 NOTE — Progress Notes (Addendum)
Subjective: This is a patient of Dr. Josephine Cables who was admitted yesterday with GI bleeding. She had apparently been having severe nausea with multiple bouts of vomiting and severe diarrhea for several days prior to coming. She says she lost 13 pounds. She has a previous history of GI bleeding and has had EGD and colonoscopy. Apparently at one point she had ischemic colitis. She had some chest pain during all this but she thinks is related to her vigorous vomiting. This morning she says she feels better but still very weak. She is still having some relatively bright red blood per rectum. She is hemodynamically stable. She is currently having venipuncture for repeat CBC.  Objective: Vital signs in last 24 hours: Temp:  [97.5 F (36.4 C)-100.4 F (38 C)] 99 F (37.2 C) (10/20 0737) Pulse Rate:  [63-83] 72 (10/20 0500) Resp:  [12-24] 18 (10/20 0500) BP: (120-185)/(40-112) 128/55 mmHg (10/20 0500) SpO2:  [90 %-100 %] 93 % (10/20 0500) Weight:  [94.3 kg (207 lb 14.3 oz)-99.791 kg (220 lb)] 94.3 kg (207 lb 14.3 oz) (10/19 2311) Weight change:  Last BM Date: 03/14/14  Intake/Output from previous day: 10/19 0701 - 10/20 0700 In: 2355.3 [I.V.:1955.3; IV Piggyback:400] Out: 803 [Urine:801; Stool:2]  PHYSICAL EXAM General appearance: alert, cooperative and mild distress Resp: clear to auscultation bilaterally Cardio: regular rate and rhythm, S1, S2 normal, no murmur, click, rub or gallop GI: Minimal tenderness with active bowel sounds Extremities: extremities normal, atraumatic, no cyanosis or edema  Lab Results:  Results for orders placed during the hospital encounter of 03/14/14 (from the past 48 hour(s))  CBC WITH DIFFERENTIAL     Status: Abnormal   Collection Time    03/14/14  5:25 PM      Result Value Ref Range   WBC 13.6 (*) 4.0 - 10.5 K/uL   RBC 5.56 (*) 3.87 - 5.11 MIL/uL   Hemoglobin 13.5  12.0 - 15.0 g/dL   HCT 41.3  36.0 - 46.0 %   MCV 74.3 (*) 78.0 - 100.0 fL   MCH 24.3 (*)  26.0 - 34.0 pg   MCHC 32.7  30.0 - 36.0 g/dL   RDW 16.6 (*) 11.5 - 15.5 %   Platelets 410 (*) 150 - 400 K/uL   Neutrophils Relative % 84 (*) 43 - 77 %   Neutro Abs 11.5 (*) 1.7 - 7.7 K/uL   Lymphocytes Relative 11 (*) 12 - 46 %   Lymphs Abs 1.5  0.7 - 4.0 K/uL   Monocytes Relative 5  3 - 12 %   Monocytes Absolute 0.7  0.1 - 1.0 K/uL   Eosinophils Relative 0  0 - 5 %   Eosinophils Absolute 0.0  0.0 - 0.7 K/uL   Basophils Relative 0  0 - 1 %   Basophils Absolute 0.0  0.0 - 0.1 K/uL  BASIC METABOLIC PANEL     Status: Abnormal   Collection Time    03/14/14  5:25 PM      Result Value Ref Range   Sodium 145  137 - 147 mEq/L   Potassium 3.9  3.7 - 5.3 mEq/L   Chloride 103  96 - 112 mEq/L   CO2 26  19 - 32 mEq/L   Glucose, Bld 109 (*) 70 - 99 mg/dL   BUN 19  6 - 23 mg/dL   Creatinine, Ser 0.86  0.50 - 1.10 mg/dL   Calcium 10.1  8.4 - 10.5 mg/dL   GFR calc non Af Amer 67 (*) >  90 mL/min   GFR calc Af Amer 78 (*) >90 mL/min   Comment: (NOTE)     The eGFR has been calculated using the CKD EPI equation.     This calculation has not been validated in all clinical situations.     eGFR's persistently <90 mL/min signify possible Chronic Kidney     Disease.   Anion gap 16 (*) 5 - 15  APTT     Status: Abnormal   Collection Time    03/14/14  5:25 PM      Result Value Ref Range   aPTT 41 (*) 24 - 37 seconds   Comment:            IF BASELINE aPTT IS ELEVATED,     SUGGEST PATIENT RISK ASSESSMENT     BE USED TO DETERMINE APPROPRIATE     ANTICOAGULANT THERAPY.  PROTIME-INR     Status: None   Collection Time    03/14/14  5:25 PM      Result Value Ref Range   Prothrombin Time 13.3  11.6 - 15.2 seconds   INR 1.00  0.00 - 1.49  TROPONIN I     Status: None   Collection Time    03/14/14  5:25 PM      Result Value Ref Range   Troponin I <0.30  <0.30 ng/mL   Comment:            Due to the release kinetics of cTnI,     a negative result within the first hours     of the onset of symptoms does  not rule out     myocardial infarction with certainty.     If myocardial infarction is still suspected,     repeat the test at appropriate intervals.  TYPE AND SCREEN     Status: None   Collection Time    03/14/14  5:25 PM      Result Value Ref Range   ABO/RH(D) B NEG     Antibody Screen NEG     Sample Expiration 03/17/2014    I-STAT CG4 LACTIC ACID, ED     Status: None   Collection Time    03/14/14  5:46 PM      Result Value Ref Range   Lactic Acid, Venous 1.69  0.5 - 2.2 mmol/L  URINALYSIS, ROUTINE W REFLEX MICROSCOPIC     Status: Abnormal   Collection Time    03/14/14  6:43 PM      Result Value Ref Range   Color, Urine YELLOW  YELLOW   APPearance CLEAR  CLEAR   Specific Gravity, Urine >1.030 (*) 1.005 - 1.030   pH 5.5  5.0 - 8.0   Glucose, UA NEGATIVE  NEGATIVE mg/dL   Hgb urine dipstick NEGATIVE  NEGATIVE   Bilirubin Urine NEGATIVE  NEGATIVE   Ketones, ur TRACE (*) NEGATIVE mg/dL   Protein, ur NEGATIVE  NEGATIVE mg/dL   Urobilinogen, UA 0.2  0.0 - 1.0 mg/dL   Nitrite NEGATIVE  NEGATIVE   Leukocytes, UA NEGATIVE  NEGATIVE   Comment: MICROSCOPIC NOT DONE ON URINES WITH NEGATIVE PROTEIN, BLOOD, LEUKOCYTES, NITRITE, OR GLUCOSE <1000 mg/dL.  PRO B NATRIURETIC PEPTIDE     Status: Abnormal   Collection Time    03/14/14  8:22 PM      Result Value Ref Range   Pro B Natriuretic peptide (BNP) 193.6 (*) 0 - 125 pg/mL  TROPONIN I     Status: None   Collection Time  03/14/14  8:22 PM      Result Value Ref Range   Troponin I <0.30  <0.30 ng/mL   Comment:            Due to the release kinetics of cTnI,     a negative result within the first hours     of the onset of symptoms does not rule out     myocardial infarction with certainty.     If myocardial infarction is still suspected,     repeat the test at appropriate intervals.  CBC     Status: Abnormal   Collection Time    03/14/14  8:22 PM      Result Value Ref Range   WBC 13.4 (*) 4.0 - 10.5 K/uL   RBC 5.50 (*) 3.87 -  5.11 MIL/uL   Hemoglobin 13.3  12.0 - 15.0 g/dL   HCT 41.1  36.0 - 46.0 %   MCV 74.7 (*) 78.0 - 100.0 fL   MCH 24.2 (*) 26.0 - 34.0 pg   MCHC 32.4  30.0 - 36.0 g/dL   RDW 16.6 (*) 11.5 - 15.5 %   Platelets 345  150 - 400 K/uL  MRSA PCR SCREENING     Status: None   Collection Time    03/14/14 10:30 PM      Result Value Ref Range   MRSA by PCR NEGATIVE  NEGATIVE   Comment:            The GeneXpert MRSA Assay (FDA     approved for NASAL specimens     only), is one component of a     comprehensive MRSA colonization     surveillance program. It is not     intended to diagnose MRSA     infection nor to guide or     monitor treatment for     MRSA infections.  COMPREHENSIVE METABOLIC PANEL     Status: Abnormal   Collection Time    03/15/14  1:54 AM      Result Value Ref Range   Sodium 141  137 - 147 mEq/L   Potassium 3.6 (*) 3.7 - 5.3 mEq/L   Chloride 104  96 - 112 mEq/L   CO2 22  19 - 32 mEq/L   Glucose, Bld 120 (*) 70 - 99 mg/dL   BUN 14  6 - 23 mg/dL   Creatinine, Ser 0.84  0.50 - 1.10 mg/dL   Calcium 8.8  8.4 - 10.5 mg/dL   Total Protein 6.2  6.0 - 8.3 g/dL   Albumin 3.5  3.5 - 5.2 g/dL   AST 17  0 - 37 U/L   ALT 8  0 - 35 U/L   Alkaline Phosphatase 65  39 - 117 U/L   Total Bilirubin 0.4  0.3 - 1.2 mg/dL   GFR calc non Af Amer 69 (*) >90 mL/min   GFR calc Af Amer 80 (*) >90 mL/min   Comment: (NOTE)     The eGFR has been calculated using the CKD EPI equation.     This calculation has not been validated in all clinical situations.     eGFR's persistently <90 mL/min signify possible Chronic Kidney     Disease.   Anion gap 15  5 - 15  CBC WITH DIFFERENTIAL     Status: Abnormal   Collection Time    03/15/14  1:54 AM      Result Value Ref Range   WBC 13.3 (*) 4.0 -  10.5 K/uL   RBC 4.92  3.87 - 5.11 MIL/uL   Hemoglobin 11.8 (*) 12.0 - 15.0 g/dL   HCT 36.9  36.0 - 46.0 %   MCV 75.0 (*) 78.0 - 100.0 fL   MCH 24.0 (*) 26.0 - 34.0 pg   MCHC 32.0  30.0 - 36.0 g/dL   RDW  16.7 (*) 11.5 - 15.5 %   Platelets 324  150 - 400 K/uL   Neutrophils Relative % 80 (*) 43 - 77 %   Neutro Abs 10.5 (*) 1.7 - 7.7 K/uL   Lymphocytes Relative 13  12 - 46 %   Lymphs Abs 1.8  0.7 - 4.0 K/uL   Monocytes Relative 7  3 - 12 %   Monocytes Absolute 1.0  0.1 - 1.0 K/uL   Eosinophils Relative 0  0 - 5 %   Eosinophils Absolute 0.0  0.0 - 0.7 K/uL   Basophils Relative 0  0 - 1 %   Basophils Absolute 0.0  0.0 - 0.1 K/uL  TROPONIN I     Status: None   Collection Time    03/15/14  1:54 AM      Result Value Ref Range   Troponin I <0.30  <0.30 ng/mL   Comment:            Due to the release kinetics of cTnI,     a negative result within the first hours     of the onset of symptoms does not rule out     myocardial infarction with certainty.     If myocardial infarction is still suspected,     repeat the test at appropriate intervals.    ABGS No results found for this basename: PHART, PCO2, PO2ART, TCO2, HCO3,  in the last 72 hours CULTURES Recent Results (from the past 240 hour(s))  MRSA PCR SCREENING     Status: None   Collection Time    03/14/14 10:30 PM      Result Value Ref Range Status   MRSA by PCR NEGATIVE  NEGATIVE Final   Comment:            The GeneXpert MRSA Assay (FDA     approved for NASAL specimens     only), is one component of a     comprehensive MRSA colonization     surveillance program. It is not     intended to diagnose MRSA     infection nor to guide or     monitor treatment for     MRSA infections.   Studies/Results: Ct Abdomen Pelvis Wo Contrast  03/14/2014   CLINICAL DATA:  One day history of nausea, abdominal pain, and diarrhea. Bright red bleeding per rectum. Recent history of lifting heavy boxes. Emesis. Anterior chest pain.  EXAM: CT ABDOMEN AND PELVIS WITHOUT CONTRAST  TECHNIQUE: Multidetector CT imaging of the abdomen and pelvis was performed following the standard protocol without IV contrast.  COMPARISON:  04/08/2011  FINDINGS: 4 mm  subpleural nodule in the right middle lung anteriorly is unchanged since prior study. Given the long interval, this is likely benign. Mild dependent changes in the lung bases. Moderate-sized esophageal hiatal hernia.  Vague increased density in the gallbladder suggesting minimally radiopaque stones. No inflammatory infiltration. The unenhanced appearance of the liver, spleen, pancreas, adrenal glands, kidneys, inferior vena cava, and retroperitoneal lymph nodes is unremarkable. Calcification of abdominal aorta without aneurysm. No gastric wall thickening. Small bowel appear unremarkable without evidence of dilatation or wall thickening. Contrast  material flows to the colon without evidence of obstruction. Diffusely stool-filled colon without distention. The splenic flexure and descending colon demonstrate mild colonic wall thickening with pericolonic infiltration suggesting colitis of nonspecific etiology. Similar changes were present on the previous study. No abscess. No free air or free fluid in the abdomen.  Pelvis: Diverticulosis of the sigmoid colon without evidence of diverticulitis. Uterus and ovaries are not enlarged. Bladder is decompressed. Appendix is normal. No free or loculated pelvic fluid collections. No pelvic mass or lymphadenopathy. Degenerative changes in the lumbar spine. No destructive bone lesions.  IMPRESSION: Wall thickening and pericolonic infiltration involving the descending colon most likely representing infectious or inflammatory colitis. Ischemic colitis or diverticulitis less likely. Sigmoid diverticula without diverticulitis. Moderate-sized esophageal hiatal hernia. Cholelithiasis.   Electronically Signed   By: Lucienne Capers M.D.   On: 03/14/2014 23:00   Dg Chest Port 1 View  03/14/2014   CLINICAL DATA:  Acute chest pain.  EXAM: PORTABLE CHEST - 1 VIEW  COMPARISON:  June 17, 2013.  FINDINGS: Stable cardiomegaly. Sternotomy wires are noted. Mildly increased central pulmonary  vascular congestion is noted with possible bilateral perihilar edema. No pneumothorax or significant pleural effusion is noted. Bony thorax is intact.  IMPRESSION: Mildly increased central pulmonary vascular congestion with possible bilateral perihilar edema.   Electronically Signed   By: Sabino Dick M.D.   On: 03/14/2014 17:50    Medications:  Prior to Admission:  Prescriptions prior to admission  Medication Sig Dispense Refill  . Ascorbic Acid (VITAMIN C) 1000 MG tablet Take 2,000 mg by mouth daily.      Marland Kitchen atorvastatin (LIPITOR) 40 MG tablet Take 1 tablet by mouth every evening.      . cholecalciferol (VITAMIN D) 1000 UNITS tablet Take 1,000 Units by mouth daily.        Marland Kitchen lisinopril (PRINIVIL,ZESTRIL) 10 MG tablet Take 10 mg by mouth daily.        . metoprolol succinate (TOPROL-XL) 25 MG 24 hr tablet Take 25 mg by mouth daily.        . pantoprazole (PROTONIX) 40 MG tablet Take 1 tablet (40 mg total) by mouth 2 (two) times daily.  60 tablet  3  . traMADol-acetaminophen (ULTRACET) 37.5-325 MG per tablet Take 1 tablet by mouth 2 (two) times daily.      Marland Kitchen levalbuterol (XOPENEX HFA) 45 MCG/ACT inhaler Inhale 2 puffs into the lungs every 6 (six) hours as needed for wheezing.  1 Inhaler  12   Scheduled: . sodium chloride   Intravenous STAT  . ciprofloxacin  400 mg Intravenous Q12H  . metronidazole  500 mg Intravenous Q8H  . pantoprazole (PROTONIX) IV  40 mg Intravenous Q12H   Continuous: . sodium chloride 100 mL/hr at 03/14/14 2013   JYN:[id number], ondansetron (ZOFRAN) IV  Assesment: She is having bright red blood per rectum. She has previous episode in colitis. She's been having nausea and vomiting and diarrhea prior to admission. She had an episode of chest pain but has ruled out for MI. It may have been related to her vomiting. I think she was dehydrated on admission. CT suggests colitis probably infectious and she's been treated for that Active Problems:   Lower GI bleed   GIB  (gastrointestinal bleeding)    Plan: She is having serial hemoglobin and hematocrits checked. Her hemoglobin has dropped from 13.3 last night to 11.8 early this morning and another hemoglobin level is pending. She will have GI consultation. Echocardiogram is pending.  LOS: 1 day   [name] L 03/15/2014, 8:02 AM

## 2014-03-15 NOTE — Progress Notes (Signed)
UR chart review completed.  

## 2014-03-15 NOTE — Progress Notes (Signed)
PT HAS BEEN UP TO [facility] TO VOID SMALL AMOUNT W/ SMALL AMOUNT RED/PINK DRAINAGE IN URINE CONTAINER. PT DENIES HAVING ANY HX OR CURRENT VAGINAL DRAINAGE.

## 2014-03-16 ENCOUNTER — Encounter (HOSPITAL_COMMUNITY): Payer: Self-pay

## 2014-03-16 DIAGNOSIS — E860 Dehydration: Secondary | ICD-10-CM | POA: Diagnosis present

## 2014-03-16 LAB — CBC WITH DIFFERENTIAL/PLATELET
BASOS ABS: 0 10*3/uL (ref 0.0–0.1)
Basophils Relative: 0 % (ref 0–1)
Eosinophils Absolute: 0.1 10*3/uL (ref 0.0–0.7)
Eosinophils Relative: 1 % (ref 0–5)
HCT: 36.1 % (ref 36.0–46.0)
Hemoglobin: 11.4 g/dL — ABNORMAL LOW (ref 12.0–15.0)
LYMPHS PCT: 23 % (ref 12–46)
Lymphs Abs: 2.1 10*3/uL (ref 0.7–4.0)
MCH: 23.9 pg — ABNORMAL LOW (ref 26.0–34.0)
MCHC: 31.6 g/dL (ref 30.0–36.0)
MCV: 75.7 fL — ABNORMAL LOW (ref 78.0–100.0)
Monocytes Absolute: 0.7 10*3/uL (ref 0.1–1.0)
Monocytes Relative: 7 % (ref 3–12)
NEUTROS PCT: 69 % (ref 43–77)
Neutro Abs: 6.4 10*3/uL (ref 1.7–7.7)
PLATELETS: 297 10*3/uL (ref 150–400)
RBC: 4.77 MIL/uL (ref 3.87–5.11)
RDW: 16.7 % — AB (ref 11.5–15.5)
WBC: 9.3 10*3/uL (ref 4.0–10.5)

## 2014-03-16 LAB — COMPREHENSIVE METABOLIC PANEL
ALK PHOS: 64 U/L (ref 39–117)
ALT: 8 U/L (ref 0–35)
AST: 12 U/L (ref 0–37)
Albumin: 3.2 g/dL — ABNORMAL LOW (ref 3.5–5.2)
Anion gap: 10 (ref 5–15)
BUN: 6 mg/dL (ref 6–23)
CO2: 26 mEq/L (ref 19–32)
Calcium: 8.5 mg/dL (ref 8.4–10.5)
Chloride: 109 mEq/L (ref 96–112)
Creatinine, Ser: 0.9 mg/dL (ref 0.50–1.10)
GFR calc Af Amer: 73 mL/min — ABNORMAL LOW (ref >90)
GFR, EST NON AFRICAN AMERICAN: 63 mL/min — AB (ref >90)
Glucose, Bld: 93 mg/dL (ref 70–99)
Potassium: 3.4 mEq/L — ABNORMAL LOW (ref 3.7–5.3)
SODIUM: 145 meq/L (ref 137–147)
Total Bilirubin: 0.4 mg/dL (ref 0.3–1.2)
Total Protein: 5.7 g/dL — ABNORMAL LOW (ref 6.0–8.3)

## 2014-03-16 MED ORDER — ATORVASTATIN CALCIUM 40 MG PO TABS
40.0000 mg | ORAL_TABLET | Freq: Every evening | ORAL | Status: DC
  Administered 2014-03-16: 40 mg via ORAL
  Filled 2014-03-16: qty 1

## 2014-03-16 MED ORDER — METOPROLOL SUCCINATE ER 25 MG PO TB24
25.0000 mg | ORAL_TABLET | Freq: Every day | ORAL | Status: DC
  Administered 2014-03-16 – 2014-03-17 (×2): 25 mg via ORAL
  Filled 2014-03-16 (×2): qty 1

## 2014-03-16 MED ORDER — CIPROFLOXACIN HCL 250 MG PO TABS
500.0000 mg | ORAL_TABLET | Freq: Two times a day (BID) | ORAL | Status: DC
  Administered 2014-03-16 – 2014-03-17 (×2): 500 mg via ORAL
  Filled 2014-03-16 (×2): qty 2

## 2014-03-16 MED ORDER — METRONIDAZOLE 500 MG PO TABS
500.0000 mg | ORAL_TABLET | Freq: Three times a day (TID) | ORAL | Status: DC
  Administered 2014-03-16 – 2014-03-17 (×2): 500 mg via ORAL
  Filled 2014-03-16 (×2): qty 1

## 2014-03-16 MED ORDER — LISINOPRIL 10 MG PO TABS
10.0000 mg | ORAL_TABLET | Freq: Every day | ORAL | Status: DC
  Administered 2014-03-16 – 2014-03-17 (×2): 10 mg via ORAL
  Filled 2014-03-16 (×2): qty 1

## 2014-03-16 NOTE — Progress Notes (Signed)
Subjective: She says she feels better. She has no new complaints. Her breathing is okay. Her diarrhea is resolving. She has no bleeding at this point. Nausea is improving.  Objective: Vital signs in last 24 hours: Temp:  [97 F (36.1 C)-98.5 F (36.9 C)] 98.5 F (36.9 C) (10/21 0808) Pulse Rate:  [57-108] 71 (10/21 0400) Resp:  [12-25] 16 (10/21 0400) BP: (102-189)/(47-100) 151/76 mmHg (10/21 0300) SpO2:  [92 %-99 %] 98 % (10/21 0400) Weight:  [98.8 kg (217 lb 13 oz)] 98.8 kg (217 lb 13 oz) (10/21 0500) Weight change: -0.991 kg (-2 lb 3 oz) Last BM Date: 03/14/14  Intake/Output from previous day: 10/20 0701 - 10/21 0700 In: 4287 [P.O.:1680; I.V.:2350; IV Piggyback:700] Out: 2600 [Urine:2600]  PHYSICAL EXAM General appearance: alert, cooperative and no distress Resp: clear to auscultation bilaterally Cardio: regular rate and rhythm, S1, S2 normal, no murmur, click, rub or gallop GI: Only very mild tenderness Extremities: extremities normal, atraumatic, no cyanosis or edema  Lab Results:  Results for orders placed during the hospital encounter of 03/14/14 (from the past 48 hour(s))  OCCULT BLOOD, POC DEVICE     Status: Abnormal   Collection Time    03/14/14  5:19 PM      Result Value Ref Range   Fecal Occult Bld POSITIVE (*) NEGATIVE  CBC WITH DIFFERENTIAL     Status: Abnormal   Collection Time    03/14/14  5:25 PM      Result Value Ref Range   WBC 13.6 (*) 4.0 - 10.5 K/uL   RBC 5.56 (*) 3.87 - 5.11 MIL/uL   Hemoglobin 13.5  12.0 - 15.0 g/dL   HCT 41.3  36.0 - 46.0 %   MCV 74.3 (*) 78.0 - 100.0 fL   MCH 24.3 (*) 26.0 - 34.0 pg   MCHC 32.7  30.0 - 36.0 g/dL   RDW 16.6 (*) 11.5 - 15.5 %   Platelets 410 (*) 150 - 400 K/uL   Neutrophils Relative % 84 (*) 43 - 77 %   Neutro Abs 11.5 (*) 1.7 - 7.7 K/uL   Lymphocytes Relative 11 (*) 12 - 46 %   Lymphs Abs 1.5  0.7 - 4.0 K/uL   Monocytes Relative 5  3 - 12 %   Monocytes Absolute 0.7  0.1 - 1.0 K/uL   Eosinophils Relative  0  0 - 5 %   Eosinophils Absolute 0.0  0.0 - 0.7 K/uL   Basophils Relative 0  0 - 1 %   Basophils Absolute 0.0  0.0 - 0.1 K/uL  BASIC METABOLIC PANEL     Status: Abnormal   Collection Time    03/14/14  5:25 PM      Result Value Ref Range   Sodium 145  137 - 147 mEq/L   Potassium 3.9  3.7 - 5.3 mEq/L   Chloride 103  96 - 112 mEq/L   CO2 26  19 - 32 mEq/L   Glucose, Bld 109 (*) 70 - 99 mg/dL   BUN 19  6 - 23 mg/dL   Creatinine, Ser 0.86  0.50 - 1.10 mg/dL   Calcium 10.1  8.4 - 10.5 mg/dL   GFR calc non Af Amer 67 (*) >90 mL/min   GFR calc Af Amer 78 (*) >90 mL/min   Comment: (NOTE)     The eGFR has been calculated using the CKD EPI equation.     This calculation has not been validated in all clinical situations.  eGFR's persistently <90 mL/min signify possible Chronic Kidney     Disease.   Anion gap 16 (*) 5 - 15  APTT     Status: Abnormal   Collection Time    03/14/14  5:25 PM      Result Value Ref Range   aPTT 41 (*) 24 - 37 seconds   Comment:            IF BASELINE aPTT IS ELEVATED,     SUGGEST PATIENT RISK ASSESSMENT     BE USED TO DETERMINE APPROPRIATE     ANTICOAGULANT THERAPY.  PROTIME-INR     Status: None   Collection Time    03/14/14  5:25 PM      Result Value Ref Range   Prothrombin Time 13.3  11.6 - 15.2 seconds   INR 1.00  0.00 - 1.49  TROPONIN I     Status: None   Collection Time    03/14/14  5:25 PM      Result Value Ref Range   Troponin I <0.30  <0.30 ng/mL   Comment:            Due to the release kinetics of cTnI,     a negative result within the first hours     of the onset of symptoms does not rule out     myocardial infarction with certainty.     If myocardial infarction is still suspected,     repeat the test at appropriate intervals.  TYPE AND SCREEN     Status: None   Collection Time    03/14/14  5:25 PM      Result Value Ref Range   ABO/RH(D) B NEG     Antibody Screen NEG     Sample Expiration 03/17/2014    I-STAT CG4 LACTIC ACID, ED      Status: None   Collection Time    03/14/14  5:46 PM      Result Value Ref Range   Lactic Acid, Venous 1.69  0.5 - 2.2 mmol/L  URINALYSIS, ROUTINE W REFLEX MICROSCOPIC     Status: Abnormal   Collection Time    03/14/14  6:43 PM      Result Value Ref Range   Color, Urine YELLOW  YELLOW   APPearance CLEAR  CLEAR   Specific Gravity, Urine >1.030 (*) 1.005 - 1.030   pH 5.5  5.0 - 8.0   Glucose, UA NEGATIVE  NEGATIVE mg/dL   Hgb urine dipstick NEGATIVE  NEGATIVE   Bilirubin Urine NEGATIVE  NEGATIVE   Ketones, ur TRACE (*) NEGATIVE mg/dL   Protein, ur NEGATIVE  NEGATIVE mg/dL   Urobilinogen, UA 0.2  0.0 - 1.0 mg/dL   Nitrite NEGATIVE  NEGATIVE   Leukocytes, UA NEGATIVE  NEGATIVE   Comment: MICROSCOPIC NOT DONE ON URINES WITH NEGATIVE PROTEIN, BLOOD, LEUKOCYTES, NITRITE, OR GLUCOSE <1000 mg/dL.  PRO B NATRIURETIC PEPTIDE     Status: Abnormal   Collection Time    03/14/14  8:22 PM      Result Value Ref Range   Pro B Natriuretic peptide (BNP) 193.6 (*) 0 - 125 pg/mL  TROPONIN I     Status: None   Collection Time    03/14/14  8:22 PM      Result Value Ref Range   Troponin I <0.30  <0.30 ng/mL   Comment:            Due to the release kinetics of cTnI,  a negative result within the first hours     of the onset of symptoms does not rule out     myocardial infarction with certainty.     If myocardial infarction is still suspected,     repeat the test at appropriate intervals.  CBC     Status: Abnormal   Collection Time    03/14/14  8:22 PM      Result Value Ref Range   WBC 13.4 (*) 4.0 - 10.5 K/uL   RBC 5.50 (*) 3.87 - 5.11 MIL/uL   Hemoglobin 13.3  12.0 - 15.0 g/dL   HCT 41.1  36.0 - 46.0 %   MCV 74.7 (*) 78.0 - 100.0 fL   MCH 24.2 (*) 26.0 - 34.0 pg   MCHC 32.4  30.0 - 36.0 g/dL   RDW 16.6 (*) 11.5 - 15.5 %   Platelets 345  150 - 400 K/uL  MRSA PCR SCREENING     Status: None   Collection Time    03/14/14 10:30 PM      Result Value Ref Range   MRSA by PCR NEGATIVE   NEGATIVE   Comment:            The GeneXpert MRSA Assay (FDA     approved for NASAL specimens     only), is one component of a     comprehensive MRSA colonization     surveillance program. It is not     intended to diagnose MRSA     infection nor to guide or     monitor treatment for     MRSA infections.  COMPREHENSIVE METABOLIC PANEL     Status: Abnormal   Collection Time    03/15/14  1:54 AM      Result Value Ref Range   Sodium 141  137 - 147 mEq/L   Potassium 3.6 (*) 3.7 - 5.3 mEq/L   Chloride 104  96 - 112 mEq/L   CO2 22  19 - 32 mEq/L   Glucose, Bld 120 (*) 70 - 99 mg/dL   BUN 14  6 - 23 mg/dL   Creatinine, Ser 0.84  0.50 - 1.10 mg/dL   Calcium 8.8  8.4 - 10.5 mg/dL   Total Protein 6.2  6.0 - 8.3 g/dL   Albumin 3.5  3.5 - 5.2 g/dL   AST 17  0 - 37 U/L   ALT 8  0 - 35 U/L   Alkaline Phosphatase 65  39 - 117 U/L   Total Bilirubin 0.4  0.3 - 1.2 mg/dL   GFR calc non Af Amer 69 (*) >90 mL/min   GFR calc Af Amer 80 (*) >90 mL/min   Comment: (NOTE)     The eGFR has been calculated using the CKD EPI equation.     This calculation has not been validated in all clinical situations.     eGFR's persistently <90 mL/min signify possible Chronic Kidney     Disease.   Anion gap 15  5 - 15  CBC WITH DIFFERENTIAL     Status: Abnormal   Collection Time    03/15/14  1:54 AM      Result Value Ref Range   WBC 13.3 (*) 4.0 - 10.5 K/uL   RBC 4.92  3.87 - 5.11 MIL/uL   Hemoglobin 11.8 (*) 12.0 - 15.0 g/dL   HCT 36.9  36.0 - 46.0 %   MCV 75.0 (*) 78.0 - 100.0 fL   MCH 24.0 (*) 26.0 -  34.0 pg   MCHC 32.0  30.0 - 36.0 g/dL   RDW 16.7 (*) 11.5 - 15.5 %   Platelets 324  150 - 400 K/uL   Neutrophils Relative % 80 (*) 43 - 77 %   Neutro Abs 10.5 (*) 1.7 - 7.7 K/uL   Lymphocytes Relative 13  12 - 46 %   Lymphs Abs 1.8  0.7 - 4.0 K/uL   Monocytes Relative 7  3 - 12 %   Monocytes Absolute 1.0  0.1 - 1.0 K/uL   Eosinophils Relative 0  0 - 5 %   Eosinophils Absolute 0.0  0.0 - 0.7 K/uL    Basophils Relative 0  0 - 1 %   Basophils Absolute 0.0  0.0 - 0.1 K/uL  TROPONIN I     Status: None   Collection Time    03/15/14  1:54 AM      Result Value Ref Range   Troponin I <0.30  <0.30 ng/mL   Comment:            Due to the release kinetics of cTnI,     a negative result within the first hours     of the onset of symptoms does not rule out     myocardial infarction with certainty.     If myocardial infarction is still suspected,     repeat the test at appropriate intervals.  TROPONIN I     Status: None   Collection Time    03/15/14  7:59 AM      Result Value Ref Range   Troponin I <0.30  <0.30 ng/mL   Comment:            Due to the release kinetics of cTnI,     a negative result within the first hours     of the onset of symptoms does not rule out     myocardial infarction with certainty.     If myocardial infarction is still suspected,     repeat the test at appropriate intervals.  CBC     Status: Abnormal   Collection Time    03/15/14 11:52 AM      Result Value Ref Range   WBC 11.7 (*) 4.0 - 10.5 K/uL   RBC 4.99  3.87 - 5.11 MIL/uL   Hemoglobin 11.9 (*) 12.0 - 15.0 g/dL   HCT 37.8  36.0 - 46.0 %   MCV 75.8 (*) 78.0 - 100.0 fL   MCH 23.8 (*) 26.0 - 34.0 pg   MCHC 31.5  30.0 - 36.0 g/dL   RDW 16.8 (*) 11.5 - 15.5 %   Platelets 321  150 - 400 K/uL  CBC     Status: Abnormal   Collection Time    03/15/14  7:53 PM      Result Value Ref Range   WBC 10.8 (*) 4.0 - 10.5 K/uL   RBC 4.68  3.87 - 5.11 MIL/uL   Hemoglobin 11.2 (*) 12.0 - 15.0 g/dL   HCT 35.5 (*) 36.0 - 46.0 %   MCV 75.9 (*) 78.0 - 100.0 fL   MCH 23.9 (*) 26.0 - 34.0 pg   MCHC 31.5  30.0 - 36.0 g/dL   RDW 16.8 (*) 11.5 - 15.5 %   Platelets 298  150 - 400 K/uL  CBC WITH DIFFERENTIAL     Status: Abnormal   Collection Time    03/16/14  4:20 AM      Result Value Ref Range  WBC 9.3  4.0 - 10.5 K/uL   RBC 4.77  3.87 - 5.11 MIL/uL   Hemoglobin 11.4 (*) 12.0 - 15.0 g/dL   HCT 36.1  36.0 - 46.0 %   MCV  75.7 (*) 78.0 - 100.0 fL   MCH 23.9 (*) 26.0 - 34.0 pg   MCHC 31.6  30.0 - 36.0 g/dL   RDW 16.7 (*) 11.5 - 15.5 %   Platelets 297  150 - 400 K/uL   Neutrophils Relative % 69  43 - 77 %   Neutro Abs 6.4  1.7 - 7.7 K/uL   Lymphocytes Relative 23  12 - 46 %   Lymphs Abs 2.1  0.7 - 4.0 K/uL   Monocytes Relative 7  3 - 12 %   Monocytes Absolute 0.7  0.1 - 1.0 K/uL   Eosinophils Relative 1  0 - 5 %   Eosinophils Absolute 0.1  0.0 - 0.7 K/uL   Basophils Relative 0  0 - 1 %   Basophils Absolute 0.0  0.0 - 0.1 K/uL  COMPREHENSIVE METABOLIC PANEL     Status: Abnormal   Collection Time    03/16/14  4:20 AM      Result Value Ref Range   Sodium 145  137 - 147 mEq/L   Potassium 3.4 (*) 3.7 - 5.3 mEq/L   Chloride 109  96 - 112 mEq/L   CO2 26  19 - 32 mEq/L   Glucose, Bld 93  70 - 99 mg/dL   BUN 6  6 - 23 mg/dL   Creatinine, Ser 0.90  0.50 - 1.10 mg/dL   Calcium 8.5  8.4 - 10.5 mg/dL   Total Protein 5.7 (*) 6.0 - 8.3 g/dL   Albumin 3.2 (*) 3.5 - 5.2 g/dL   AST 12  0 - 37 U/L   ALT 8  0 - 35 U/L   Alkaline Phosphatase 64  39 - 117 U/L   Total Bilirubin 0.4  0.3 - 1.2 mg/dL   GFR calc non Af Amer 63 (*) >90 mL/min   GFR calc Af Amer 73 (*) >90 mL/min   Comment: (NOTE)     The eGFR has been calculated using the CKD EPI equation.     This calculation has not been validated in all clinical situations.     eGFR's persistently <90 mL/min signify possible Chronic Kidney     Disease.   Anion gap 10  5 - 15    ABGS No results found for this basename: PHART, PCO2, PO2ART, TCO2, HCO3,  in the last 72 hours CULTURES Recent Results (from the past 240 hour(s))  MRSA PCR SCREENING     Status: None   Collection Time    03/14/14 10:30 PM      Result Value Ref Range Status   MRSA by PCR NEGATIVE  NEGATIVE Final   Comment:            The GeneXpert MRSA Assay (FDA     approved for NASAL specimens     only), is one component of a     comprehensive MRSA colonization     surveillance program. It is  not     intended to diagnose MRSA     infection nor to guide or     monitor treatment for     MRSA infections.   Studies/Results: Ct Abdomen Pelvis Wo Contrast  03/14/2014   CLINICAL DATA:  One day history of nausea, abdominal pain, and diarrhea. Bright red bleeding  per rectum. Recent history of lifting heavy boxes. Emesis. Anterior chest pain.  EXAM: CT ABDOMEN AND PELVIS WITHOUT CONTRAST  TECHNIQUE: Multidetector CT imaging of the abdomen and pelvis was performed following the standard protocol without IV contrast.  COMPARISON:  04/08/2011  FINDINGS: 4 mm subpleural nodule in the right middle lung anteriorly is unchanged since prior study. Given the long interval, this is likely benign. Mild dependent changes in the lung bases. Moderate-sized esophageal hiatal hernia.  Vague increased density in the gallbladder suggesting minimally radiopaque stones. No inflammatory infiltration. The unenhanced appearance of the liver, spleen, pancreas, adrenal glands, kidneys, inferior vena cava, and retroperitoneal lymph nodes is unremarkable. Calcification of abdominal aorta without aneurysm. No gastric wall thickening. Small bowel appear unremarkable without evidence of dilatation or wall thickening. Contrast material flows to the colon without evidence of obstruction. Diffusely stool-filled colon without distention. The splenic flexure and descending colon demonstrate mild colonic wall thickening with pericolonic infiltration suggesting colitis of nonspecific etiology. Similar changes were present on the previous study. No abscess. No free air or free fluid in the abdomen.  Pelvis: Diverticulosis of the sigmoid colon without evidence of diverticulitis. Uterus and ovaries are not enlarged. Bladder is decompressed. Appendix is normal. No free or loculated pelvic fluid collections. No pelvic mass or lymphadenopathy. Degenerative changes in the lumbar spine. No destructive bone lesions.  IMPRESSION: Wall thickening and  pericolonic infiltration involving the descending colon most likely representing infectious or inflammatory colitis. Ischemic colitis or diverticulitis less likely. Sigmoid diverticula without diverticulitis. Moderate-sized esophageal hiatal hernia. Cholelithiasis.   Electronically Signed   By: Lucienne Capers M.D.   On: 03/14/2014 23:00   Dg Chest Port 1 View  03/14/2014   CLINICAL DATA:  Acute chest pain.  EXAM: PORTABLE CHEST - 1 VIEW  COMPARISON:  June 17, 2013.  FINDINGS: Stable cardiomegaly. Sternotomy wires are noted. Mildly increased central pulmonary vascular congestion is noted with possible bilateral perihilar edema. No pneumothorax or significant pleural effusion is noted. Bony thorax is intact.  IMPRESSION: Mildly increased central pulmonary vascular congestion with possible bilateral perihilar edema.   Electronically Signed   By: Sabino Dick M.D.   On: 03/14/2014 17:50    Medications:  Prior to Admission:  Prescriptions prior to admission  Medication Sig Dispense Refill  . Ascorbic Acid (VITAMIN C) 1000 MG tablet Take 2,000 mg by mouth daily.      Marland Kitchen atorvastatin (LIPITOR) 40 MG tablet Take 1 tablet by mouth every evening.      . cholecalciferol (VITAMIN D) 1000 UNITS tablet Take 1,000 Units by mouth daily.        Marland Kitchen lisinopril (PRINIVIL,ZESTRIL) 10 MG tablet Take 10 mg by mouth daily.        . metoprolol succinate (TOPROL-XL) 25 MG 24 hr tablet Take 25 mg by mouth daily.        . pantoprazole (PROTONIX) 40 MG tablet Take 1 tablet (40 mg total) by mouth 2 (two) times daily.  60 tablet  3  . traMADol-acetaminophen (ULTRACET) 37.5-325 MG per tablet Take 1 tablet by mouth 2 (two) times daily.      Marland Kitchen levalbuterol (XOPENEX HFA) 45 MCG/ACT inhaler Inhale 2 puffs into the lungs every 6 (six) hours as needed for wheezing.  1 Inhaler  12   Scheduled: . ciprofloxacin  400 mg Intravenous Q12H  . metronidazole  500 mg Intravenous Q8H  . pantoprazole (PROTONIX) IV  40 mg Intravenous Q12H    Continuous: . sodium chloride 100 mL/hr at  03/16/14 0600   TRZ:NBVAPOLID  Assesment: She was admitted with gastrointestinal bleeding that seems to be related to colitis. Her colitis is being treated as infectious colitis and he is improving. She has a history of previous colonoscopy that suggested that she might have some ischemic colitis but her history now is more suggestive of infectious. She does have COPD at baseline. This is stable. Her bleeding has resolved. She had chest pain prior to admission but none since. She thinks it is related to intense vomiting and I think that's probably true. She was dehydrated on admission but much better now. Her blood pressure has been elevated on a couple of occasions and I think that's probably because she's volume repleted Active Problems:   Colitis   Lower GI bleed   GIB (gastrointestinal bleeding)   COPD (chronic obstructive pulmonary disease)   Chest pain    Plan: Continue current treatments. Transfer from ICU. Increase diet    LOS: 2 days   Javiel Canepa L 03/16/2014, 8:15 AM

## 2014-03-16 NOTE — Progress Notes (Signed)
Subjective: No further loose stools or evidence of rectal bleeding. Lower abdominal discomfort with exertion, otherwise doing well. Tolerating clear liquids. Would like to advance diet.   Objective: Vital signs in last 24 hours: Temp:  [97 F (36.1 C)-98.5 F (36.9 C)] 98.5 F (36.9 C) (10/21 0808) Pulse Rate:  [57-108] 71 (10/21 0400) Resp:  [12-25] 16 (10/21 0400) BP: (102-189)/(47-100) 151/76 mmHg (10/21 0300) SpO2:  [92 %-99 %] 98 % (10/21 0400) Weight:  [217 lb 13 oz (98.8 kg)] 217 lb 13 oz (98.8 kg) (10/21 0500) Last BM Date: 03/14/14 General:   Alert and oriented, pleasant Head:  Normocephalic and atraumatic. Abdomen:  Bowel sounds present, soft, non-tender, non-distended. No HSM or hernias noted. No rebound or guarding. No masses appreciated  Msk:  Symmetrical without gross deformities. Normal posture. Extremities:  Without edema. Psych:  Alert and cooperative. Normal mood and affect.  Intake/Output from previous day: 10/20 0701 - 10/21 0700 In: 4730 [P.O.:1680; I.V.:2350; IV Piggyback:700] Out: 2600 [Urine:2600] Intake/Output this shift: Total I/O In: 480 [P.O.:480] Out: 850 [Urine:850]  Lab Results:  Recent Labs  03/15/14 1152 03/15/14 1953 03/16/14 0420  WBC 11.7* 10.8* 9.3  HGB 11.9* 11.2* 11.4*  HCT 37.8 35.5* 36.1  PLT 321 298 297   BMET  Recent Labs  03/14/14 1725 03/15/14 0154 03/16/14 0420  NA 145 141 145  K 3.9 3.6* 3.4*  CL 103 104 109  CO2 26 22 26   GLUCOSE 109* 120* 93  BUN 19 14 6   CREATININE 0.86 0.84 0.90  CALCIUM 10.1 8.8 8.5   LFT  Recent Labs  03/15/14 0154 03/16/14 0420  PROT 6.2 5.7*  ALBUMIN 3.5 3.2*  AST 17 12  ALT 8 8  ALKPHOS 65 64  BILITOT 0.4 0.4   PT/INR  Recent Labs  03/14/14 1725  LABPROT 13.3  INR 1.00     Studies/Results: Ct Abdomen Pelvis Wo Contrast  03/14/2014   CLINICAL DATA:  One day history of nausea, abdominal pain, and diarrhea. Bright red bleeding per rectum. Recent history of  lifting heavy boxes. Emesis. Anterior chest pain.  EXAM: CT ABDOMEN AND PELVIS WITHOUT CONTRAST  TECHNIQUE: Multidetector CT imaging of the abdomen and pelvis was performed following the standard protocol without IV contrast.  COMPARISON:  04/08/2011  FINDINGS: 4 mm subpleural nodule in the right middle lung anteriorly is unchanged since prior study. Given the long interval, this is likely benign. Mild dependent changes in the lung bases. Moderate-sized esophageal hiatal hernia.  Vague increased density in the gallbladder suggesting minimally radiopaque stones. No inflammatory infiltration. The unenhanced appearance of the liver, spleen, pancreas, adrenal glands, kidneys, inferior vena cava, and retroperitoneal lymph nodes is unremarkable. Calcification of abdominal aorta without aneurysm. No gastric wall thickening. Small bowel appear unremarkable without evidence of dilatation or wall thickening. Contrast material flows to the colon without evidence of obstruction. Diffusely stool-filled colon without distention. The splenic flexure and descending colon demonstrate mild colonic wall thickening with pericolonic infiltration suggesting colitis of nonspecific etiology. Similar changes were present on the previous study. No abscess. No free air or free fluid in the abdomen.  Pelvis: Diverticulosis of the sigmoid colon without evidence of diverticulitis. Uterus and ovaries are not enlarged. Bladder is decompressed. Appendix is normal. No free or loculated pelvic fluid collections. No pelvic mass or lymphadenopathy. Degenerative changes in the lumbar spine. No destructive bone lesions.  IMPRESSION: Wall thickening and pericolonic infiltration involving the descending colon most likely representing infectious or inflammatory colitis.  Ischemic colitis or diverticulitis less likely. Sigmoid diverticula without diverticulitis. Moderate-sized esophageal hiatal hernia. Cholelithiasis.   Electronically Signed   By: Burman NievesWilliam   Stevens M.D.   On: 03/14/2014 23:00   Dg Chest Port 1 View  03/14/2014   CLINICAL DATA:  Acute chest pain.  EXAM: PORTABLE CHEST - 1 VIEW  COMPARISON:  June 17, 2013.  FINDINGS: Stable cardiomegaly. Sternotomy wires are noted. Mildly increased central pulmonary vascular congestion is noted with possible bilateral perihilar edema. No pneumothorax or significant pleural effusion is noted. Bony thorax is intact.  IMPRESSION: Mildly increased central pulmonary vascular congestion with possible bilateral perihilar edema.   Electronically Signed   By: Roque LiasJames  Green M.D.   On: 03/14/2014 17:50    Assessment: 68 year old female admitted with acute onset of nausea, vomiting, and diarrhea, followed by rectal bleeding; drift in Hgb of 2 grams noted since admission. CT with evidence of wall-thickening involved the descending colon. Symptoms appear to be more infectious in nature, with ischemic colitis less likely but remaining in the differential. Clinically, she has improved from admission and without any further rectal bleeding.   As of note, last colonoscopy in 2012 with moderate diverticulosis, small internal hemorrhoids, likely ischemic colitis. If concern for ischemic colitis remains, would consider evaluating mesenteric vasculature. For now, appears to be clinically improving with supportive measures and empiric antibiotics. Agree with advancing diet to low-residue.   Plan: Low-residue diet Cipro and Flagyl empirically: transition to oral  PPI BID Hopeful discharge in next 24-48 hours Continue to monitor for any further loose stools or rectal bleeding  Nira RetortAnna W. Sams, ANP-BC [facility]     LOS: 2 days    03/16/2014, 10:59 AM

## 2014-03-17 ENCOUNTER — Telehealth: Payer: Self-pay | Admitting: Gastroenterology

## 2014-03-17 ENCOUNTER — Encounter: Payer: Self-pay | Admitting: Internal Medicine

## 2014-03-17 DIAGNOSIS — I10 Essential (primary) hypertension: Secondary | ICD-10-CM | POA: Diagnosis present

## 2014-03-17 DIAGNOSIS — E8770 Fluid overload, unspecified: Secondary | ICD-10-CM | POA: Diagnosis not present

## 2014-03-17 DIAGNOSIS — E876 Hypokalemia: Secondary | ICD-10-CM | POA: Diagnosis not present

## 2014-03-17 LAB — COMPREHENSIVE METABOLIC PANEL
ALBUMIN: 3 g/dL — AB (ref 3.5–5.2)
ALT: 9 U/L (ref 0–35)
AST: 14 U/L (ref 0–37)
Alkaline Phosphatase: 59 U/L (ref 39–117)
Anion gap: 10 (ref 5–15)
BUN: 9 mg/dL (ref 6–23)
CO2: 26 mEq/L (ref 19–32)
Calcium: 8.6 mg/dL (ref 8.4–10.5)
Chloride: 107 mEq/L (ref 96–112)
Creatinine, Ser: 0.88 mg/dL (ref 0.50–1.10)
GFR calc non Af Amer: 65 mL/min — ABNORMAL LOW (ref >90)
GFR, EST AFRICAN AMERICAN: 75 mL/min — AB (ref >90)
Glucose, Bld: 101 mg/dL — ABNORMAL HIGH (ref 70–99)
POTASSIUM: 3.3 meq/L — AB (ref 3.7–5.3)
Sodium: 143 mEq/L (ref 137–147)
TOTAL PROTEIN: 5.6 g/dL — AB (ref 6.0–8.3)
Total Bilirubin: 0.3 mg/dL (ref 0.3–1.2)

## 2014-03-17 LAB — CBC WITH DIFFERENTIAL/PLATELET
BASOS PCT: 1 % (ref 0–1)
Basophils Absolute: 0 10*3/uL (ref 0.0–0.1)
EOS ABS: 0.2 10*3/uL (ref 0.0–0.7)
EOS PCT: 2 % (ref 0–5)
HCT: 35.1 % — ABNORMAL LOW (ref 36.0–46.0)
HEMOGLOBIN: 11.1 g/dL — AB (ref 12.0–15.0)
Lymphocytes Relative: 22 % (ref 12–46)
Lymphs Abs: 1.9 10*3/uL (ref 0.7–4.0)
MCH: 23.8 pg — AB (ref 26.0–34.0)
MCHC: 31.6 g/dL (ref 30.0–36.0)
MCV: 75.3 fL — AB (ref 78.0–100.0)
MONOS PCT: 8 % (ref 3–12)
Monocytes Absolute: 0.7 10*3/uL (ref 0.1–1.0)
NEUTROS PCT: 67 % (ref 43–77)
Neutro Abs: 5.9 10*3/uL (ref 1.7–7.7)
PLATELETS: 297 10*3/uL (ref 150–400)
RBC: 4.66 MIL/uL (ref 3.87–5.11)
RDW: 16.5 % — ABNORMAL HIGH (ref 11.5–15.5)
WBC: 8.7 10*3/uL (ref 4.0–10.5)

## 2014-03-17 MED ORDER — METRONIDAZOLE 500 MG PO TABS
500.0000 mg | ORAL_TABLET | Freq: Three times a day (TID) | ORAL | Status: DC
Start: 2014-03-17 — End: 2014-04-19

## 2014-03-17 MED ORDER — POTASSIUM CHLORIDE CRYS ER 20 MEQ PO TBCR: 20.0000 meq | EXTENDED_RELEASE_TABLET | Freq: Two times a day (BID) | ORAL | Status: DC

## 2014-03-17 MED ORDER — FUROSEMIDE 40 MG PO TABS
40.0000 mg | ORAL_TABLET | Freq: Once | ORAL | Status: AC
  Administered 2014-03-17: 40 mg via ORAL
  Filled 2014-03-17: qty 1

## 2014-03-17 MED ORDER — CIPROFLOXACIN HCL 500 MG PO TABS: 500.0000 mg | ORAL_TABLET | Freq: Two times a day (BID) | ORAL | Status: DC

## 2014-03-17 MED ORDER — POTASSIUM CHLORIDE CRYS ER 20 MEQ PO TBCR
40.0000 meq | EXTENDED_RELEASE_TABLET | Freq: Once | ORAL | Status: AC
  Administered 2014-03-17: 40 meq via ORAL
  Filled 2014-03-17: qty 2

## 2014-03-17 MED ORDER — POTASSIUM CHLORIDE CRYS ER 20 MEQ PO TBCR: 40.0000 meq | EXTENDED_RELEASE_TABLET | Freq: Once | ORAL | Status: DC

## 2014-03-17 NOTE — Telephone Encounter (Signed)
Appointment made and letter sent °

## 2014-03-17 NOTE — Care Management Note (Signed)
    Page 1 of 1   03/17/2014     4:30:43 PM CARE MANAGEMENT NOTE 03/17/2014  Patient:  [name],[name]   Account Number:  [id number]  Date Initiated:  03/17/2014  Documentation initiated by:  Anibal HendersonBOLDEN,Dacoda Spallone  Subjective/Objective Assessment:   Admitted with GIB with  CP. Pt is from home,  is independent, with family support,  and will be returning home at [name] today.     Action/Plan:   Pt set up with HH/RN to monitor cardiovascular and for recurrent GIB.   Anticipated DC Date:  03/17/2014   Anticipated DC Plan:  HOME W HOME HEALTH SERVICES      DC Planning Services  CM consult      [facility]   Choice offered to / List presented to:  [name]-1 Patient        HH arranged  HH-1 RN  HH-10 DISEASE MANAGEMENT      HH agency  [facility]   Status of service:  Completed, signed off Medicare Important Message given?  YES (If response is "NO", the following Medicare IM given date fields will be blank) Date Medicare IM given:  03/17/2014 Medicare IM given by:  Anibal HendersonBOLDEN,[name] Date Additional Medicare IM given:   Additional Medicare IM given by:    Discharge Disposition:  HOME W HOME HEALTH SERVICES  Per UR Regulation:  Reviewed for med. necessity/level of care/duration of stay  If discussed at Long Length of Stay Meetings, dates discussed:    Comments:  03/17/14 1130 Anibal HendersonGeneva Kathy Wares RN/CM

## 2014-03-17 NOTE — Progress Notes (Signed)
Subjective: She is awake and alert and looks much better. She's been able to tolerate her diet. She is on oral medications.  Objective: Vital signs in last 24 hours: Temp:  [97.6 F (36.4 C)-99 F (37.2 C)] 99 F (37.2 C) (10/22 0601) Pulse Rate:  [60-73] 60 (10/22 0601) Resp:  [14-20] 16 (10/22 0601) BP: (126-175)/(69-82) 162/82 mmHg (10/22 0811) SpO2:  [96 %-99 %] 96 % (10/22 0601) Weight:  [98.6 kg (217 lb 6 oz)] 98.6 kg (217 lb 6 oz) (10/22 0601) Weight change: -0.2 kg (-7.1 oz) Last BM Date: 03/14/14  Intake/Output from previous day: 10/21 0701 - 10/22 0700 In: 2402.5 [P.O.:960; I.V.:1442.5] Out: 1650 [Urine:1650]  PHYSICAL EXAM General appearance: alert, cooperative and mild distress Resp: clear to auscultation bilaterally Cardio: regular rate and rhythm, S1, S2 normal, no murmur, click, rub or gallop GI: soft, non-tender; bowel sounds normal; no masses,  no organomegaly Extremities: She has trace edema of both legs and some edema in her hand  Lab Results:  Results for orders placed during the hospital encounter of 03/14/14 (from the past 48 hour(s))  CBC     Status: Abnormal   Collection Time    03/15/14 11:52 AM      Result Value Ref Range   WBC 11.7 (*) 4.0 - 10.5 K/uL   RBC 4.99  3.87 - 5.11 MIL/uL   Hemoglobin 11.9 (*) 12.0 - 15.0 g/dL   HCT 37.8  36.0 - 46.0 %   MCV 75.8 (*) 78.0 - 100.0 fL   MCH 23.8 (*) 26.0 - 34.0 pg   MCHC 31.5  30.0 - 36.0 g/dL   RDW 16.8 (*) 11.5 - 15.5 %   Platelets 321  150 - 400 K/uL  CBC     Status: Abnormal   Collection Time    03/15/14  7:53 PM      Result Value Ref Range   WBC 10.8 (*) 4.0 - 10.5 K/uL   RBC 4.68  3.87 - 5.11 MIL/uL   Hemoglobin 11.2 (*) 12.0 - 15.0 g/dL   HCT 35.5 (*) 36.0 - 46.0 %   MCV 75.9 (*) 78.0 - 100.0 fL   MCH 23.9 (*) 26.0 - 34.0 pg   MCHC 31.5  30.0 - 36.0 g/dL   RDW 16.8 (*) 11.5 - 15.5 %   Platelets 298  150 - 400 K/uL  CBC WITH DIFFERENTIAL     Status: Abnormal   Collection Time     03/16/14  4:20 AM      Result Value Ref Range   WBC 9.3  4.0 - 10.5 K/uL   RBC 4.77  3.87 - 5.11 MIL/uL   Hemoglobin 11.4 (*) 12.0 - 15.0 g/dL   HCT 36.1  36.0 - 46.0 %   MCV 75.7 (*) 78.0 - 100.0 fL   MCH 23.9 (*) 26.0 - 34.0 pg   MCHC 31.6  30.0 - 36.0 g/dL   RDW 16.7 (*) 11.5 - 15.5 %   Platelets 297  150 - 400 K/uL   Neutrophils Relative % 69  43 - 77 %   Neutro Abs 6.4  1.7 - 7.7 K/uL   Lymphocytes Relative 23  12 - 46 %   Lymphs Abs 2.1  0.7 - 4.0 K/uL   Monocytes Relative 7  3 - 12 %   Monocytes Absolute 0.7  0.1 - 1.0 K/uL   Eosinophils Relative 1  0 - 5 %   Eosinophils Absolute 0.1  0.0 - 0.7 K/uL  Basophils Relative 0  0 - 1 %   Basophils Absolute 0.0  0.0 - 0.1 K/uL  COMPREHENSIVE METABOLIC PANEL     Status: Abnormal   Collection Time    03/16/14  4:20 AM      Result Value Ref Range   Sodium 145  137 - 147 mEq/L   Potassium 3.4 (*) 3.7 - 5.3 mEq/L   Chloride 109  96 - 112 mEq/L   CO2 26  19 - 32 mEq/L   Glucose, Bld 93  70 - 99 mg/dL   BUN 6  6 - 23 mg/dL   Creatinine, Ser 0.90  0.50 - 1.10 mg/dL   Calcium 8.5  8.4 - 10.5 mg/dL   Total Protein 5.7 (*) 6.0 - 8.3 g/dL   Albumin 3.2 (*) 3.5 - 5.2 g/dL   AST 12  0 - 37 U/L   ALT 8  0 - 35 U/L   Alkaline Phosphatase 64  39 - 117 U/L   Total Bilirubin 0.4  0.3 - 1.2 mg/dL   GFR calc non Af Amer 63 (*) >90 mL/min   GFR calc Af Amer 73 (*) >90 mL/min   Comment: (NOTE)     The eGFR has been calculated using the CKD EPI equation.     This calculation has not been validated in all clinical situations.     eGFR's persistently <90 mL/min signify possible Chronic Kidney     Disease.   Anion gap 10  5 - 15  CBC WITH DIFFERENTIAL     Status: Abnormal   Collection Time    03/17/14  5:25 AM      Result Value Ref Range   WBC 8.7  4.0 - 10.5 K/uL   RBC 4.66  3.87 - 5.11 MIL/uL   Hemoglobin 11.1 (*) 12.0 - 15.0 g/dL   HCT 35.1 (*) 36.0 - 46.0 %   MCV 75.3 (*) 78.0 - 100.0 fL   MCH 23.8 (*) 26.0 - 34.0 pg   MCHC 31.6   30.0 - 36.0 g/dL   RDW 16.5 (*) 11.5 - 15.5 %   Platelets 297  150 - 400 K/uL   Neutrophils Relative % 67  43 - 77 %   Neutro Abs 5.9  1.7 - 7.7 K/uL   Lymphocytes Relative 22  12 - 46 %   Lymphs Abs 1.9  0.7 - 4.0 K/uL   Monocytes Relative 8  3 - 12 %   Monocytes Absolute 0.7  0.1 - 1.0 K/uL   Eosinophils Relative 2  0 - 5 %   Eosinophils Absolute 0.2  0.0 - 0.7 K/uL   Basophils Relative 1  0 - 1 %   Basophils Absolute 0.0  0.0 - 0.1 K/uL  COMPREHENSIVE METABOLIC PANEL     Status: Abnormal   Collection Time    03/17/14  5:25 AM      Result Value Ref Range   Sodium 143  137 - 147 mEq/L   Potassium 3.3 (*) 3.7 - 5.3 mEq/L   Chloride 107  96 - 112 mEq/L   CO2 26  19 - 32 mEq/L   Glucose, Bld 101 (*) 70 - 99 mg/dL   BUN 9  6 - 23 mg/dL   Creatinine, Ser 0.88  0.50 - 1.10 mg/dL   Calcium 8.6  8.4 - 10.5 mg/dL   Total Protein 5.6 (*) 6.0 - 8.3 g/dL   Albumin 3.0 (*) 3.5 - 5.2 g/dL   AST 14  0 -  37 U/L   ALT 9  0 - 35 U/L   Alkaline Phosphatase 59  39 - 117 U/L   Total Bilirubin 0.3  0.3 - 1.2 mg/dL   GFR calc non Af Amer 65 (*) >90 mL/min   GFR calc Af Amer 75 (*) >90 mL/min   Comment: (NOTE)     The eGFR has been calculated using the CKD EPI equation.     This calculation has not been validated in all clinical situations.     eGFR's persistently <90 mL/min signify possible Chronic Kidney     Disease.   Anion gap 10  5 - 15    ABGS No results found for this basename: PHART, PCO2, PO2ART, TCO2, HCO3,  in the last 72 hours CULTURES Recent Results (from the past 240 hour(s))  MRSA PCR SCREENING     Status: None   Collection Time    03/14/14 10:30 PM      Result Value Ref Range Status   MRSA by PCR NEGATIVE  NEGATIVE Final   Comment:            The GeneXpert MRSA Assay (FDA     approved for NASAL specimens     only), is one component of a     comprehensive MRSA colonization     surveillance program. It is not     intended to diagnose MRSA     infection nor to guide or      monitor treatment for     MRSA infections.   Studies/Results: No results found.  Medications:  Prior to Admission:  Prescriptions prior to admission  Medication Sig Dispense Refill  . Ascorbic Acid (VITAMIN C) 1000 MG tablet Take 2,000 mg by mouth daily.      Marland Kitchen atorvastatin (LIPITOR) 40 MG tablet Take 1 tablet by mouth every evening.      . cholecalciferol (VITAMIN D) 1000 UNITS tablet Take 1,000 Units by mouth daily.        Marland Kitchen lisinopril (PRINIVIL,ZESTRIL) 10 MG tablet Take 10 mg by mouth daily.        . metoprolol succinate (TOPROL-XL) 25 MG 24 hr tablet Take 25 mg by mouth daily.        . pantoprazole (PROTONIX) 40 MG tablet Take 1 tablet (40 mg total) by mouth 2 (two) times daily.  60 tablet  3  . traMADol-acetaminophen (ULTRACET) 37.5-325 MG per tablet Take 1 tablet by mouth 2 (two) times daily.      Marland Kitchen levalbuterol (XOPENEX HFA) 45 MCG/ACT inhaler Inhale 2 puffs into the lungs every 6 (six) hours as needed for wheezing.  1 Inhaler  12   Scheduled: . atorvastatin  40 mg Oral QPM  . ciprofloxacin  500 mg Oral BID  . furosemide  40 mg Oral Once  . lisinopril  10 mg Oral Daily  . metoprolol succinate  25 mg Oral Daily  . metroNIDAZOLE  500 mg Oral 3 times per day  . pantoprazole (PROTONIX) IV  40 mg Intravenous Q12H  . potassium chloride  40 mEq Oral Once  . potassium chloride  40 mEq Oral Once   Continuous: . sodium chloride 50 mL/hr at 03/16/14 1153   IHK:[id number]  Assesment: She was admitted with colitis and had lower GI bleeding from that. She has not required blood transfusion. At baseline she has COPD and has pretty stable. She also has hypertension which is well controlled. She was dehydrated on admission and that has resolved and in fact she's  probably slightly volume overloaded. Her potassium level is low and needs treatment Active Problems:   Colitis   Lower GI bleed   GIB (gastrointestinal bleeding)   COPD (chronic obstructive pulmonary disease)   Chest  pain   Dehydration    Plan: She will receive Lasix today. She will receive potassium today. If she's able to tolerate her food I think she could be discharged with home health services    LOS: 3 days   Fabien Travelstead L 03/17/2014, 8:39 AM

## 2014-03-17 NOTE — Discharge Planning (Addendum)
Pt stated she was ready to go home and she had no pain.  IV was removed and pt educated on future s/sx of GI bleed and when to call dr. Or return to the hospital.  Pt ill be wheeled to car once ride arrives. Pt home meds returned from pharm.

## 2014-03-17 NOTE — Telephone Encounter (Signed)
Patient needs hospital f/u, in 3-4 weeks, nonurgent.

## 2014-03-17 NOTE — Discharge Summary (Signed)
Physician Discharge Summary  Patient ID: [name] MRN: [id number] DOB/AGE: 70/11/1945 70 y.o. Primary Care Physician:FANTA,TESFAYE, MD Admit date: 03/14/2014 Discharge date: 03/17/2014    Discharge Diagnoses:   Active Problems:   Colitis   Lower GI bleed   GIB (gastrointestinal bleeding)   COPD (chronic obstructive pulmonary disease)   Chest pain   Dehydration   Essential hypertension   Hypokalemia     Medication List         atorvastatin 40 MG tablet  Commonly known as:  LIPITOR  Take 1 tablet by mouth every evening.     cholecalciferol 1000 UNITS tablet  Commonly known as:  VITAMIN D  Take 1,000 Units by mouth daily.     ciprofloxacin 500 MG tablet  Commonly known as:  CIPRO  Take 1 tablet (500 mg total) by mouth 2 (two) times daily.     levalbuterol 45 MCG/ACT inhaler  Commonly known as:  XOPENEX HFA  Inhale 2 puffs into the lungs every 6 (six) hours as needed for wheezing.     lisinopril 10 MG tablet  Commonly known as:  PRINIVIL,ZESTRIL  Take 10 mg by mouth daily.     metoprolol succinate 25 MG 24 hr tablet  Commonly known as:  TOPROL-XL  Take 25 mg by mouth daily.     metroNIDAZOLE 500 MG tablet  Commonly known as:  FLAGYL  Take 1 tablet (500 mg total) by mouth every 8 (eight) hours.     pantoprazole 40 MG tablet  Commonly known as:  PROTONIX  Take 1 tablet (40 mg total) by mouth 2 (two) times daily.     traMADol-acetaminophen 37.5-325 MG per tablet  Commonly known as:  ULTRACET  Take 1 tablet by mouth 2 (two) times daily.     vitamin C 1000 MG tablet  Take 2,000 mg by mouth daily.        Discharged Condition: improved    Consults: GI  Significant Diagnostic Studies: Ct Abdomen Pelvis Wo Contrast  03/14/2014   CLINICAL DATA:  One day history of nausea, abdominal pain, and diarrhea. Bright red bleeding per rectum. Recent history of lifting heavy boxes. Emesis. Anterior chest pain.  EXAM: CT ABDOMEN AND PELVIS WITHOUT CONTRAST   TECHNIQUE: Multidetector CT imaging of the abdomen and pelvis was performed following the standard protocol without IV contrast.  COMPARISON:  04/08/2011  FINDINGS: 4 mm subpleural nodule in the right middle lung anteriorly is unchanged since prior study. Given the long interval, this is likely benign. Mild dependent changes in the lung bases. Moderate-sized esophageal hiatal hernia.  Vague increased density in the gallbladder suggesting minimally radiopaque stones. No inflammatory infiltration. The unenhanced appearance of the liver, spleen, pancreas, adrenal glands, kidneys, inferior vena cava, and retroperitoneal lymph nodes is unremarkable. Calcification of abdominal aorta without aneurysm. No gastric wall thickening. Small bowel appear unremarkable without evidence of dilatation or wall thickening. Contrast material flows to the colon without evidence of obstruction. Diffusely stool-filled colon without distention. The splenic flexure and descending colon demonstrate mild colonic wall thickening with pericolonic infiltration suggesting colitis of nonspecific etiology. Similar changes were present on the previous study. No abscess. No free air or free fluid in the abdomen.  Pelvis: Diverticulosis of the sigmoid colon without evidence of diverticulitis. Uterus and ovaries are not enlarged. Bladder is decompressed. Appendix is normal. No free or loculated pelvic fluid collections. No pelvic mass or lymphadenopathy. Degenerative changes in the lumbar spine. No destructive bone lesions.  IMPRESSION: Wall thickening and  pericolonic infiltration involving the descending colon most likely representing infectious or inflammatory colitis. Ischemic colitis or diverticulitis less likely. Sigmoid diverticula without diverticulitis. Moderate-sized esophageal hiatal hernia. Cholelithiasis.   Electronically Signed   By: Burman Nieves M.D.   On: 03/14/2014 23:00   Dg Chest Port 1 View  03/14/2014   CLINICAL DATA:  Acute  chest pain.  EXAM: PORTABLE CHEST - 1 VIEW  COMPARISON:  June 17, 2013.  FINDINGS: Stable cardiomegaly. Sternotomy wires are noted. Mildly increased central pulmonary vascular congestion is noted with possible bilateral perihilar edema. No pneumothorax or significant pleural effusion is noted. Bony thorax is intact.  IMPRESSION: Mildly increased central pulmonary vascular congestion with possible bilateral perihilar edema.   Electronically Signed   By: Roque Lias M.D.   On: 03/14/2014 17:50    Lab Results: Basic Metabolic Panel:  Recent Labs  16/10/96 0420 03/17/14 0525  NA 145 143  K 3.4* 3.3*  CL 109 107  CO2 26 26  GLUCOSE 93 101*  BUN 6 9  CREATININE 0.90 0.88  CALCIUM 8.5 8.6   Liver Function Tests:  Recent Labs  03/16/14 0420 03/17/14 0525  AST 12 14  ALT 8 9  ALKPHOS 64 59  BILITOT 0.4 0.3  PROT 5.7* 5.6*  ALBUMIN 3.2* 3.0*     CBC:  Recent Labs  03/16/14 0420 03/17/14 0525  WBC 9.3 8.7  NEUTROABS 6.4 5.9  HGB 11.4* 11.1*  HCT 36.1 35.1*  MCV 75.7* 75.3*  PLT 297 297    Recent Results (from the past 240 hour(s))  MRSA PCR SCREENING     Status: None   Collection Time    03/14/14 10:30 PM      Result Value Ref Range Status   MRSA by PCR NEGATIVE  NEGATIVE Final   Comment:            The GeneXpert MRSA Assay (FDA     approved for NASAL specimens     only), is one component of a     comprehensive MRSA colonization     surveillance program. It is not     intended to diagnose MRSA     infection nor to guide or     monitor treatment for     MRSA infections.     Hospital Course: This is a 68 year old who came to the emergency department because of bright red blood in her bowel movements. She had been having nausea vomiting and diarrhea for about 4 days prior to admission and then developed the blood in her stool. She has a previous history of blood in her stool and had colonoscopy in 2012. She was treated in the emergency department and felt to be  dehydrated but on IV fluids and had CT of the abdomen that showed colitis. This was felt to most likely be infectious. She was treated with Cipro and Flagyl her nausea and vomiting resolved her diarrhea resolved her blood in her stool resolved and her hemoglobin level was generally stable and she did not require blood transfusion. Over the next 2-3 days she became improved was able to eat take her medications by mouth and had no further abdominal pain or bleeding.  Discharge Exam: Blood pressure 162/82, pulse 60, temperature 99 F (37.2 C), temperature source Oral, resp. rate 16, height 5\' 3"  (1.6 m), weight 98.6 kg (217 lb 6 oz), SpO2 96.00%. She is awake and alert. Chest is clear. Heart is regular. Abdomen is soft. She has trace edema of  the extremities.  Disposition: Home with home health services      Discharge Instructions   Face-to-face encounter (required for Medicare/Medicaid patients)    Complete by:  As directed   I Shaleka Brines L certify that this patient is under my care and that I, or a nurse practitioner or physician's assistant working with me, had a face-to-face encounter that meets the physician face-to-face encounter requirements with this patient on 03/17/2014. The encounter with the patient was in whole, or in part for the following medical condition(s) which is the primary reason for home health care (List medical condition): Colitis/dehydration  The encounter with the patient was in whole, or in part, for the following medical condition, which is the primary reason for home health care:  Colitis/dehydration  I certify that, based on my findings, the following services are medically necessary home health services:  Nursing  My clinical findings support the need for the above services:  Unsafe ambulation due to balance issues  Further, I certify that my clinical findings support that this patient is homebound due to:  Ambulates short distances less than 300 feet  Reason for  Medically Necessary Home Health Services:  Skilled Nursing- Change/Decline in Patient Status     Home Health    Complete by:  As directed   To provide the following care/treatments:  RN             Signed: Landa Mullinax L   03/17/2014, 8:41 AM

## 2014-03-17 NOTE — Progress Notes (Signed)
Subjective:  Patient seen at 830am this morning. This is a late entry. No further BMs. Last two days ago. No further rectal bleeding. Vomiting resolved. Feels much better. Tolerating diet.   Objective: Vital signs in last 24 hours: Temp:  [98.2 F (36.8 C)-99 F (37.2 C)] 99 F (37.2 C) (10/22 0601) Pulse Rate:  [60-66] 60 (10/22 0601) Resp:  [16-20] 16 (10/22 0601) BP: (126-168)/(69-82) 162/82 mmHg (10/22 0811) SpO2:  [96 %-99 %] 96 % (10/22 0601) Weight:  [217 lb 6 oz (98.6 kg)] 217 lb 6 oz (98.6 kg) (10/22 0601) Last BM Date: 03/14/14 General:   Alert,  Well-developed, well-nourished, pleasant and cooperative in NAD Head:  Normocephalic and atraumatic. Eyes:  Sclera clear, no icterus.   Abdomen:  Soft, nontender and nondistended.     Extremities:  Without clubbing, deformity or edema. Neurologic:  Alert and  oriented x4;  grossly normal neurologically. Skin:  Intact without significant lesions or rashes. Psych:  Alert and cooperative. Normal mood and affect.  Intake/Output from previous day: 10/21 0701 - 10/22 0700 In: 2402.5 [P.O.:960; I.V.:1442.5] Out: 1650 [Urine:1650] Intake/Output this shift: Total I/O In: 360 [P.O.:360] Out: -   Lab Results: CBC  Recent Labs  03/15/14 1953 03/16/14 0420 03/17/14 0525  WBC 10.8* 9.3 8.7  HGB 11.2* 11.4* 11.1*  HCT 35.5* 36.1 35.1*  MCV 75.9* 75.7* 75.3*  PLT 298 297 297   BMET  Recent Labs  03/15/14 0154 03/16/14 0420 03/17/14 0525  NA 141 145 143  K 3.6* 3.4* 3.3*  CL 104 109 107  CO2 22 26 26   GLUCOSE 120* 93 101*  BUN 14 6 9   CREATININE 0.84 0.90 0.88  CALCIUM 8.8 8.5 8.6   LFTs  Recent Labs  03/15/14 0154 03/16/14 0420 03/17/14 0525  BILITOT 0.4 0.4 0.3  ALKPHOS 65 64 59  AST 17 12 14   ALT 8 8 9   PROT 6.2 5.7* 5.6*  ALBUMIN 3.5 3.2* 3.0*   No results found for this basename: LIPASE,  in the last 72 hours PT/INR  Recent Labs  03/14/14 1725  LABPROT 13.3  INR 1.00      Imaging  Studies: Ct Abdomen Pelvis Wo Contrast  03/14/2014   CLINICAL DATA:  One day history of nausea, abdominal pain, and diarrhea. Bright red bleeding per rectum. Recent history of lifting heavy boxes. Emesis. Anterior chest pain.  EXAM: CT ABDOMEN AND PELVIS WITHOUT CONTRAST  TECHNIQUE: Multidetector CT imaging of the abdomen and pelvis was performed following the standard protocol without IV contrast.  COMPARISON:  04/08/2011  FINDINGS: 4 mm subpleural nodule in the right middle lung anteriorly is unchanged since prior study. Given the long interval, this is likely benign. Mild dependent changes in the lung bases. Moderate-sized esophageal hiatal hernia.  Vague increased density in the gallbladder suggesting minimally radiopaque stones. No inflammatory infiltration. The unenhanced appearance of the liver, spleen, pancreas, adrenal glands, kidneys, inferior vena cava, and retroperitoneal lymph nodes is unremarkable. Calcification of abdominal aorta without aneurysm. No gastric wall thickening. Small bowel appear unremarkable without evidence of dilatation or wall thickening. Contrast material flows to the colon without evidence of obstruction. Diffusely stool-filled colon without distention. The splenic flexure and descending colon demonstrate mild colonic wall thickening with pericolonic infiltration suggesting colitis of nonspecific etiology. Similar changes were present on the previous study. No abscess. No free air or free fluid in the abdomen.  Pelvis: Diverticulosis of the sigmoid colon without evidence of diverticulitis. Uterus and ovaries are not enlarged.  Bladder is decompressed. Appendix is normal. No free or loculated pelvic fluid collections. No pelvic mass or lymphadenopathy. Degenerative changes in the lumbar spine. No destructive bone lesions.  IMPRESSION: Wall thickening and pericolonic infiltration involving the descending colon most likely representing infectious or inflammatory colitis. Ischemic  colitis or diverticulitis less likely. Sigmoid diverticula without diverticulitis. Moderate-sized esophageal hiatal hernia. Cholelithiasis.   Electronically Signed   By: Burman NievesWilliam  Stevens M.D.   On: 03/14/2014 23:00   Dg Chest Port 1 View  03/14/2014   CLINICAL DATA:  Acute chest pain.  EXAM: PORTABLE CHEST - 1 VIEW  COMPARISON:  June 17, 2013.  FINDINGS: Stable cardiomegaly. Sternotomy wires are noted. Mildly increased central pulmonary vascular congestion is noted with possible bilateral perihilar edema. No pneumothorax or significant pleural effusion is noted. Bony thorax is intact.  IMPRESSION: Mildly increased central pulmonary vascular congestion with possible bilateral perihilar edema.   Electronically Signed   By: Roque LiasJames  Green M.D.   On: 03/14/2014 17:50  [2 weeks]   Assessment: 68 year old female admitted with acute onset of nausea, vomiting, and diarrhea, followed by rectal bleeding; drift in Hgb of 2 grams noted since admission. CT with evidence of wall-thickening involved the descending colon. Symptoms appear to be more infectious in nature, with ischemic colitis less likely but remaining in the differential. Clinically, she has improved from admission and without any further rectal bleeding. As of note, last colonoscopy in 2012 with moderate diverticulosis, small internal hemorrhoids, likely ischemic colitis. If concern for ischemic colitis remains, would consider evaluating mesenteric vasculature.  Plan: 1. Complete course of cipro/flagyl. 2. Outpatient follow up in the next several weeks.    LOS: 3 days   [name]  03/17/2014, 12:16 PM

## 2014-04-01 DIAGNOSIS — E669 Obesity, unspecified: Secondary | ICD-10-CM | POA: Diagnosis not present

## 2014-04-01 DIAGNOSIS — E785 Hyperlipidemia, unspecified: Secondary | ICD-10-CM | POA: Diagnosis not present

## 2014-04-01 DIAGNOSIS — M549 Dorsalgia, unspecified: Secondary | ICD-10-CM | POA: Diagnosis not present

## 2014-04-01 DIAGNOSIS — I10 Essential (primary) hypertension: Secondary | ICD-10-CM | POA: Diagnosis not present

## 2014-04-19 ENCOUNTER — Ambulatory Visit (INDEPENDENT_AMBULATORY_CARE_PROVIDER_SITE_OTHER): Payer: Medicare Other | Admitting: Gastroenterology

## 2014-04-19 ENCOUNTER — Encounter: Payer: Self-pay | Admitting: Gastroenterology

## 2014-04-19 VITALS — BP 152/85 | HR 65 | Temp 97.7°F | Ht 63.0 in | Wt 207.0 lb

## 2014-04-19 DIAGNOSIS — K529 Noninfective gastroenteritis and colitis, unspecified: Secondary | ICD-10-CM

## 2014-04-19 NOTE — Progress Notes (Signed)
Please request last CBC and iron studies from PCP done since 02/2014 hospitalization.

## 2014-04-19 NOTE — Patient Instructions (Signed)
1. Please call if you have recurrent bleeding or diarrhea.  2. I will ask Dr. Jena Gaussourk if you need to have another colonoscopy or if you can wait until 2022.

## 2014-04-19 NOTE — Assessment & Plan Note (Signed)
Doing well at this time. Suspected infectious colitis recently requiring hospitalization. Mild GI bleed associated with it. At time of discharge she had mild microcytic anemia. Last colonoscopy was in 2012, ischemic colitis at that time. She will let us know she has any further diarrhea and/or rectal bleeding. We will request any recent labs post-hospitalization from her PCP following up on anemia. I will discuss with Dr. Jena Gaussourk regarding whether or not she needs to have another colonoscopy at this point. Further recommendations to follow.

## 2014-04-19 NOTE — Progress Notes (Signed)
cc'ed to pcp °

## 2014-04-19 NOTE — Progress Notes (Signed)
Primary Care Physician: Avon GullyFANTA,TESFAYE, MD  Primary Gastroenterologist:  Roetta SessionsMichael Rourk, MD   Chief Complaint  Patient presents with  . Follow-up    HPI: [name] is a 68 y.o. female here for hospital follow-up. She was seen back in October with lower GI bleed, colitis. Had a hospitalization back in January with melena as well. EGD at that time revealed cervical esophageal web status post disruption scope, large hiatal hernia with Sheria Langameron lesions, chronic active gastritis. Last month she had a CT without contrast that showed wall thickening involving the descending colon. Findings similar to her CT back in 2012 when she presented with ischemic colitis area. During recent hospitalization she was treated for infectious colitis with Cipro and Flagyl. Her last colonoscopy was in 2012 with moderate diverticulosis, likely ischemic colitis at that time. At time of discharge her hemoglobin was 11.1, MCV 75.3. Stool studies were never completed as she never collected any stool. She was heme+.  Doing well. No further diarrhea or bleeding. Bowel movements back to normal. Denies abdominal pain. Denies heartburn. No nausea vomiting. Appetite is good.   Current Outpatient Prescriptions  Medication Sig Dispense Refill  . Ascorbic Acid (VITAMIN C) 1000 MG tablet Take 2,000 mg by mouth daily.    Marland Kitchen. atorvastatin (LIPITOR) 40 MG tablet Take 1 tablet by mouth every evening.    . cholecalciferol (VITAMIN D) 1000 UNITS tablet Take 1,000 Units by mouth daily.      Marland Kitchen. levalbuterol (XOPENEX HFA) 45 MCG/ACT inhaler Inhale 2 puffs into the lungs every 6 (six) hours as needed for wheezing. 1 Inhaler 12  . lisinopril (PRINIVIL,ZESTRIL) 10 MG tablet Take 10 mg by mouth daily.      . metoprolol succinate (TOPROL-XL) 25 MG 24 hr tablet Take 25 mg by mouth daily.      . pantoprazole (PROTONIX) 40 MG tablet Take 1 tablet (40 mg total) by mouth 2 (two) times daily. 60 tablet 3  . traMADol-acetaminophen (ULTRACET)  37.5-325 MG per tablet Take 1 tablet by mouth 2 (two) times daily.     No current facility-administered medications for this visit.    Allergies as of 04/19/2014 - Review Complete 04/19/2014  Allergen Reaction Noted  . Omnipaque [iohexol] Other (See Comments) 09/19/2010  . Penicillins Other (See Comments) 09/19/2010   Past Medical History  Diagnosis Date  . Hypertension   . GI bleed Nov 2012    ischemic colitis  . Colitis, ischemic 03/2011  . Kidney stone   . Gallstones   . Multiple pulmonary nodules 03/2011  . Chronic back pain   . COPD (chronic obstructive pulmonary disease)   . Hiatal hernia   . Diverticulosis    Past Surgical History  Procedure Laterality Date  . Spinal fusion      x3  . Salpingoophorectomy      left  . Coronary stent placement  1995  . Colonoscopy  04/16/2011    Dr. Darrick PennaFields: moderate diverticulosis, small internal hemorrhoids, likely ischemic colitis  . Abdominal hysterectomy    . Small intestine surgery      ?adhesive disease? patient states my intestines were "stuck to my womb"  . Esophagogastroduodenoscopy N/A 06/23/2013    Dr. Jena Gaussourk: cervical esophageal web likely s/p dilation with scope, non-critical Schatzki's ring, large hiatal hernia with Sheria Langameron lesions, chronic active gastritis  . Coronary artery bypass graft  1998    ROS:  General: Negative for anorexia, weight loss, fever, chills, fatigue, weakness. ENT: Negative for hoarseness, difficulty  swallowing , nasal congestion. CV: Negative for chest pain, angina, palpitations, dyspnea on exertion, peripheral edema.  Respiratory: Negative for dyspnea at rest, dyspnea on exertion, cough, sputum, wheezing.  GI: See history of present illness. GU:  Negative for dysuria, hematuria, urinary incontinence, urinary frequency, nocturnal urination.  Endo: Negative for unusual weight change.    Physical Examination:   BP 152/85 mmHg  Pulse 65  Temp(Src) 97.7 F (36.5 C) (Oral)  Ht 5\' 3"  (1.6 m)   Wt 207 lb (93.895 kg)  BMI 36.68 kg/m2  General: Well-nourished, well-developed in no acute distress.  Eyes: No icterus. Mouth: Oropharyngeal mucosa moist and pink , no lesions erythema or exudate. Lungs: Clear to auscultation bilaterally.  Heart: Regular rate and rhythm, no murmurs rubs or gallops.  Abdomen: Bowel sounds are normal, nontender, nondistended, no hepatosplenomegaly or masses, no abdominal bruits or hernia , no rebound or guarding.   Extremities: No lower extremity edema. No clubbing or deformities. Neuro: Alert and oriented x 4   Skin: Warm and dry, no jaundice.   Psych: Alert and cooperative, normal mood and affect.  Labs:  Lab Results  Component Value Date   WBC 8.7 03/17/2014   HGB 11.1* 03/17/2014   HCT 35.1* 03/17/2014   MCV 75.3* 03/17/2014   PLT 297 03/17/2014   Lab Results  Component Value Date   CREATININE 0.88 03/17/2014   BUN 9 03/17/2014   NA 143 03/17/2014   K 3.3* 03/17/2014   CL 107 03/17/2014   CO2 26 03/17/2014   Lab Results  Component Value Date   ALT 9 03/17/2014   AST 14 03/17/2014   ALKPHOS 59 03/17/2014   BILITOT 0.3 03/17/2014   No results found for: IRON, TIBC, FERRITIN    Imaging Studies: No results found.

## 2014-04-19 NOTE — Progress Notes (Signed)
Requested labs from PCP 

## 2014-04-29 ENCOUNTER — Telehealth: Payer: Self-pay | Admitting: Gastroenterology

## 2014-04-29 NOTE — Telephone Encounter (Signed)
Pt was seen by LSL on 11/24 and I was asked to get CBC and iron studies done since her OCT hospitalization.  [name] from Dr Letitia NeriFanta's office called today to say that there were none.

## 2014-04-29 NOTE — Telephone Encounter (Signed)
OK. Please request patient have CBC, iron/tibc, ferritin. Thanks.

## 2014-05-03 ENCOUNTER — Other Ambulatory Visit: Payer: Self-pay

## 2014-05-03 ENCOUNTER — Other Ambulatory Visit: Payer: Self-pay | Admitting: Gastroenterology

## 2014-05-03 DIAGNOSIS — D509 Iron deficiency anemia, unspecified: Secondary | ICD-10-CM

## 2014-05-03 NOTE — Telephone Encounter (Signed)
Letter and lab orders mailed to the pt.  

## 2014-06-25 ENCOUNTER — Telehealth: Payer: Self-pay | Admitting: Gastroenterology

## 2014-06-25 NOTE — Telephone Encounter (Signed)
Please remind patient to get labs done

## 2014-06-27 NOTE — Telephone Encounter (Signed)
Reminder letter and copy of lab orders mailed to the pt. 

## 2014-07-01 DIAGNOSIS — E785 Hyperlipidemia, unspecified: Secondary | ICD-10-CM | POA: Diagnosis not present

## 2014-07-01 DIAGNOSIS — E669 Obesity, unspecified: Secondary | ICD-10-CM | POA: Diagnosis not present

## 2014-07-01 DIAGNOSIS — I10 Essential (primary) hypertension: Secondary | ICD-10-CM | POA: Diagnosis not present

## 2014-07-01 DIAGNOSIS — Z0001 Encounter for general adult medical examination with abnormal findings: Secondary | ICD-10-CM | POA: Diagnosis not present

## 2014-09-30 DIAGNOSIS — I2510 Atherosclerotic heart disease of native coronary artery without angina pectoris: Secondary | ICD-10-CM | POA: Diagnosis not present

## 2014-09-30 DIAGNOSIS — E785 Hyperlipidemia, unspecified: Secondary | ICD-10-CM | POA: Diagnosis not present

## 2014-09-30 DIAGNOSIS — I10 Essential (primary) hypertension: Secondary | ICD-10-CM | POA: Diagnosis not present

## 2014-09-30 DIAGNOSIS — E669 Obesity, unspecified: Secondary | ICD-10-CM | POA: Diagnosis not present

## 2014-12-30 DIAGNOSIS — I2510 Atherosclerotic heart disease of native coronary artery without angina pectoris: Secondary | ICD-10-CM | POA: Diagnosis not present

## 2014-12-30 DIAGNOSIS — J40 Bronchitis, not specified as acute or chronic: Secondary | ICD-10-CM | POA: Diagnosis not present

## 2014-12-30 DIAGNOSIS — I10 Essential (primary) hypertension: Secondary | ICD-10-CM | POA: Diagnosis not present

## 2014-12-30 DIAGNOSIS — E785 Hyperlipidemia, unspecified: Secondary | ICD-10-CM | POA: Diagnosis not present

## 2014-12-30 DIAGNOSIS — Z23 Encounter for immunization: Secondary | ICD-10-CM | POA: Diagnosis not present

## 2015-01-06 ENCOUNTER — Other Ambulatory Visit (HOSPITAL_COMMUNITY): Payer: Self-pay | Admitting: Internal Medicine

## 2015-01-06 DIAGNOSIS — Z1231 Encounter for screening mammogram for malignant neoplasm of breast: Secondary | ICD-10-CM

## 2015-01-12 ENCOUNTER — Ambulatory Visit (HOSPITAL_COMMUNITY)
Admission: RE | Admit: 2015-01-12 | Discharge: 2015-01-12 | Disposition: A | Discharge status: Home / Self Care | Payer: Medicare Other | Source: Ambulatory Visit | Attending: Internal Medicine | Admitting: Internal Medicine

## 2015-01-12 DIAGNOSIS — Z1231 Encounter for screening mammogram for malignant neoplasm of breast: Secondary | ICD-10-CM | POA: Diagnosis present

## 2015-04-03 DIAGNOSIS — I2510 Atherosclerotic heart disease of native coronary artery without angina pectoris: Secondary | ICD-10-CM | POA: Diagnosis not present

## 2015-04-03 DIAGNOSIS — I10 Essential (primary) hypertension: Secondary | ICD-10-CM | POA: Diagnosis not present

## 2015-04-03 DIAGNOSIS — E669 Obesity, unspecified: Secondary | ICD-10-CM | POA: Diagnosis not present

## 2015-04-03 DIAGNOSIS — K219 Gastro-esophageal reflux disease without esophagitis: Secondary | ICD-10-CM | POA: Diagnosis not present

## 2015-05-03 DIAGNOSIS — I2510 Atherosclerotic heart disease of native coronary artery without angina pectoris: Secondary | ICD-10-CM | POA: Diagnosis not present

## 2015-05-03 DIAGNOSIS — Z0000 Encounter for general adult medical examination without abnormal findings: Secondary | ICD-10-CM | POA: Diagnosis not present

## 2015-05-03 DIAGNOSIS — E785 Hyperlipidemia, unspecified: Secondary | ICD-10-CM | POA: Diagnosis not present

## 2015-05-03 DIAGNOSIS — I10 Essential (primary) hypertension: Secondary | ICD-10-CM | POA: Diagnosis not present

## 2015-07-10 DIAGNOSIS — I2510 Atherosclerotic heart disease of native coronary artery without angina pectoris: Secondary | ICD-10-CM | POA: Diagnosis not present

## 2015-07-10 DIAGNOSIS — K219 Gastro-esophageal reflux disease without esophagitis: Secondary | ICD-10-CM | POA: Diagnosis not present

## 2015-07-10 DIAGNOSIS — I10 Essential (primary) hypertension: Secondary | ICD-10-CM | POA: Diagnosis not present

## 2015-07-10 DIAGNOSIS — E785 Hyperlipidemia, unspecified: Secondary | ICD-10-CM | POA: Diagnosis not present

## 2015-07-18 DIAGNOSIS — J209 Acute bronchitis, unspecified: Secondary | ICD-10-CM | POA: Diagnosis not present

## 2015-07-18 DIAGNOSIS — I10 Essential (primary) hypertension: Secondary | ICD-10-CM | POA: Diagnosis not present

## 2015-08-07 ENCOUNTER — Encounter: Payer: Self-pay | Admitting: Cardiology

## 2015-08-07 ENCOUNTER — Ambulatory Visit (INDEPENDENT_AMBULATORY_CARE_PROVIDER_SITE_OTHER): Payer: Medicare Other | Admitting: Cardiology

## 2015-08-07 VITALS — BP 140/86 | HR 64 | Ht 63.0 in | Wt 196.0 lb

## 2015-08-07 DIAGNOSIS — E785 Hyperlipidemia, unspecified: Secondary | ICD-10-CM

## 2015-08-07 DIAGNOSIS — R0789 Other chest pain: Secondary | ICD-10-CM

## 2015-08-07 DIAGNOSIS — I10 Essential (primary) hypertension: Secondary | ICD-10-CM | POA: Diagnosis not present

## 2015-08-07 DIAGNOSIS — F17201 Nicotine dependence, unspecified, in remission: Secondary | ICD-10-CM

## 2015-08-07 DIAGNOSIS — I2510 Atherosclerotic heart disease of native coronary artery without angina pectoris: Secondary | ICD-10-CM | POA: Diagnosis not present

## 2015-08-07 NOTE — Progress Notes (Signed)
Cardiology Office Note  Date: 08/07/2015   ID: [name], DOB 13-May-1946, MRN [id number]  PCP: Valerie Gully, [name]  Consulting Cardiologist: [name]   Chief Complaint  Patient presents with  . Chest Pain  . Coronary Artery Disease    History of Present Illness: [name] is a 70 y.o. female referred for cardiology consultation by [name]. I do not have any specific records regarding the patient's prior cardiac history. She tells me that she underwent CABG back in 1998 and previously saw [name] for cardiology care in [place]. There are no records currently in [facility]. She states that she has not had any regular cardiology follow-up over the last several years.  She denies any significant exertional chest pain or nitroglycerin requirement, reports NYHA class II dyspnea. Main complaint is of chronic lower back "spasms." Also recently got over a URI.  I reviewed her medications which are outlined below. Cardiac regimen includes aspirin, Lipitor, lisinopril, and Toprol-XL. I am requesting her recent lab work from [name].  ECG today in the office shows sinus rhythm at 61 bpm with low voltage and decreased R wave progression, nonspecific T-wave changes. She had an echocardiogram done back in 2015 as shown below which demonstrated LVEF of 60-65% with grade 2 diastolic dysfunction.  Today I talked with her about considering a follow-up stress test to reassess ischemic burden in light of the time since her CABG. She was very hesitant to undergo any type of stress testing and preferred observation only.  Past Medical History  Diagnosis Date  . Essential hypertension   . GI bleed 03/2011  . Colitis, ischemic (HCC) 03/2011  . Nephrolithiasis   . Cholelithiasis   . Multiple pulmonary nodules 03/2011  . Chronic back pain   . COPD (chronic obstructive pulmonary disease) (HCC)   . Hiatal hernia   . Diverticulosis   . Anxiety   . GERD (gastroesophageal reflux disease)     . CAD (coronary artery disease)     Multivessel status post CABG 1998    Past Surgical History  Procedure Laterality Date  . Spinal fusion      x3  . Salpingoophorectomy Left   . Coronary stent placement  1995  . Colonoscopy  04/16/2011    Dr. Darrick Penna: moderate diverticulosis, small internal hemorrhoids, likely ischemic colitis  . Abdominal hysterectomy    . Small intestine surgery    . Esophagogastroduodenoscopy N/A 06/23/2013    Dr. Jena Gauss: cervical esophageal web likely s/p dilation with scope, non-critical Schatzki's ring, large hiatal hernia with Sheria Lang lesions, chronic active gastritis  . Coronary artery bypass graft  1998    Current Outpatient Prescriptions  Medication Sig Dispense Refill  . Ascorbic Acid (VITAMIN C) 1000 MG tablet Take 2,000 mg by mouth daily.    Marland Kitchen atorvastatin (LIPITOR) 40 MG tablet Take 1 tablet by mouth every evening.    . cholecalciferol (VITAMIN D) 1000 UNITS tablet Take 1,000 Units by mouth daily.      Marland Kitchen levalbuterol (XOPENEX HFA) 45 MCG/ACT inhaler Inhale 2 puffs into the lungs every 6 (six) hours as needed for wheezing. 1 Inhaler 12  . lisinopril (PRINIVIL,ZESTRIL) 10 MG tablet Take 10 mg by mouth daily.      . metoprolol succinate (TOPROL-XL) 25 MG 24 hr tablet Take 25 mg by mouth daily.      . pantoprazole (PROTONIX) 40 MG tablet Take 1 tablet (40 mg total) by mouth 2 (two) times daily. 60 tablet 3  .  traMADol-acetaminophen (ULTRACET) 37.5-325 MG per tablet Take 1 tablet by mouth 2 (two) times daily.     No current facility-administered medications for this visit.   Allergies:  Omnipaque and Penicillins   Social History: The patient  reports that she quit smoking about 4 years ago. Her smoking use included Cigarettes. She has a 40 pack-year smoking history. She does not have any smokeless tobacco history on file. She reports that she does not drink alcohol or use illicit drugs.   Family History: The patient's family history includes Breast cancer  (age of onset: 3667) in her sister; Diabetes Mellitus II in her sister; Stomach cancer (age of onset: 3369) in her mother. There is no history of Colon cancer.   ROS:  Please see the history of present illness. Otherwise, complete review of systems is positive for chronic lower back spasms. Chronic left leg edema following vein harvesting. She uses compression stockings.  All other systems are reviewed and negative.   Physical Exam: VS:  BP 140/86 mmHg  Pulse 64  Ht 5\' 3"  (1.6 m)  Wt 196 lb (88.905 kg)  BMI 34.73 kg/m2  SpO2 98%, BMI Body mass index is 34.73 kg/(m^2).  Wt Readings from Last 3 Encounters:  08/07/15 196 lb (88.905 kg)  04/19/14 207 lb (93.895 kg)  03/17/14 217 lb 6 oz (98.6 kg)    General: Overweight woman, appears comfortable at rest. HEENT: Conjunctiva and lids normal, oropharynx clear. Neck: Supple, no elevated JVP or carotid bruits, no thyromegaly. Lungs: Diminished breath sounds but clear overall, nonlabored breathing at rest. Thorax: Well-healed sternal incision. Cardiac: Regular rate and rhythm, no S3, soft systolic murmur, no pericardial rub. Abdomen: Soft, protuberant, bowel sounds present, no guarding or rebound. Extremities: 1+ left leg edema, distal pulses 2+. Skin: Warm and dry. Musculoskeletal: Mild kyphosis. Neuropsychiatric: Alert and oriented x3, affect grossly appropriate.  ECG: I personally reviewed the prior tracing from 03/14/2014 which showed sinus rhythm with nonspecific T-wave changes.  Recent Labwork:  October 2015: Hemoglobin 11.1, platelets 297, potassium 3.3, BUN 9, creatinine 0.9, AST 14, ALT 9  Other Studies Reviewed Today:  Echocardiogram 03/15/2014: Study Conclusions  - Left ventricle: The cavity size was normal. Wall thickness was increased increased in a pattern of mild to moderate LVH. Systolic function was normal. The estimated ejection fraction was in the range of 60% to 65%. Wall motion was normal; there were  no regional wall motion abnormalities. Features are consistent with a pseudonormal left ventricular filling pattern, with concomitant abnormal relaxation and increased filling pressure (grade 2 diastolic dysfunction). - Aortic valve: Mildly calcified annulus. Trileaflet. - Mitral valve: Mildly calcified leaflets . There was trivial regurgitation. - Left atrium: The atrium was at the upper limits of normal in size. - Right atrium: Central venous pressure (est): 3 mm Hg. - Atrial septum: No defect or patent foramen ovale was identified. - Tricuspid valve: There was mild regurgitation. - Pulmonary arteries: Systolic pressure was mildly increased. PA peak pressure: 42 mm Hg (S). - Pericardium, extracardiac: There was no pericardial effusion.  Impressions:  - Mild to moderate LVH with LVEF 60-65%, grade 2 diastolic dysfunction. Mildly calcified mitral leaflets with trivial mitral regurgitation. Upper normal biatrial size. Mild tricuspid regurgitation with mildly increased PASP 42 mm mercury.  Assessment and Plan:  1. Patient with reported history of multivessel CAD status post CABG in 1998, proceeded by percutaneous interventions. It sounds like she was followed by Dr. Katrinka BlazingSmith years ago, I do not have any further details. Her  ECG is reviewed today with nonspecific abnormalities. She does not report any significant angina symptoms and had an echocardiogram within the last two years that showed normal LVEF. I did recommend that she consider a follow-up stress test to reassess ischemic burden in light of the time since her bypass surgery, however she was hesitant to consider any type of stress test and preferred observation only at this time. We will try and get her prior CABG records.  2. Hyperlipidemia, on Lipitor. Requesting most recent lab work from [name].  3. Essential hypertension, blood pressure mildly elevated today. No changes made present regimen.  4. Tobacco  abuse in remission.  Current medicines were reviewed with the patient today.   Orders Placed This Encounter  Procedures  . EKG 12-Lead    Disposition: FU with me in 1 year.   Signed, Jonelle Sidle, [name], [facility] 08/07/2015 9:45 AM    [facility] at [facility] [address], [place], [place] [address] Phone: [phone]; Fax: [phone]

## 2015-08-07 NOTE — Patient Instructions (Signed)
Your physician wants you to follow-up in: 1 year with Dr McDowell You will receive a reminder letter in the mail two months in advance. If you don't receive a letter, please call our office to schedule the follow-up appointment.    Your physician recommends that you continue on your current medications as directed. Please refer to the Current Medication list given to you today.     If you need a refill on your cardiac medications before your next appointment, please call your pharmacy.     Thank you for choosing [facility] !        

## 2015-11-27 IMAGING — CT CT ABD-PELV W/O CM
2 of 4 series · 16 of 46 positions shown, 18 images · non-contrast
Comparison: 04/08/2011

CLINICAL DATA: One day history of nausea, abdominal pain, and
diarrhea. Bright red bleeding per rectum. Recent history of lifting
heavy boxes. Emesis. Anterior chest pain.

EXAM:
CT ABDOMEN AND PELVIS WITHOUT CONTRAST
TECHNIQUE: Multidetector CT imaging of the abdomen and pelvis was performed
following the standard protocol without IV contrast.

[Series 2: abdomen/pelvis w/o contrast · axial · non-contrast · 0.78mm/px · z∈[-404,+1]mm · 13 of 89 slices shown, 15 images]
[im 4/89  soft-tissue]
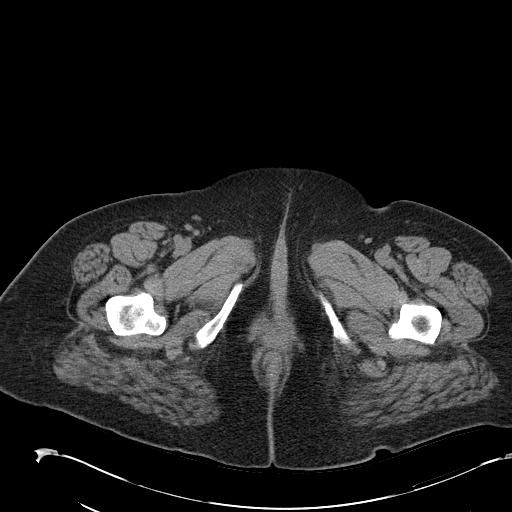
[im 4/89  bone]
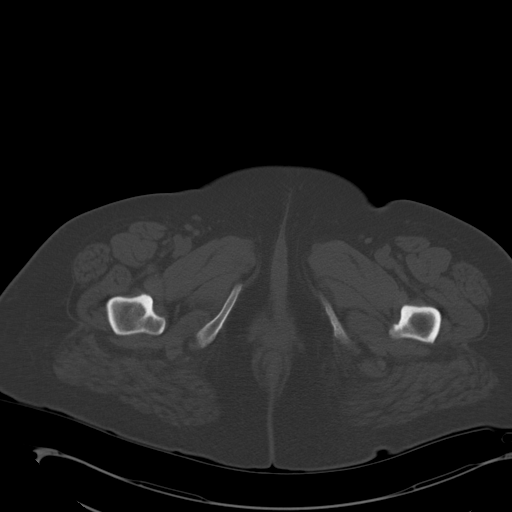
[im 11/89  soft-tissue]
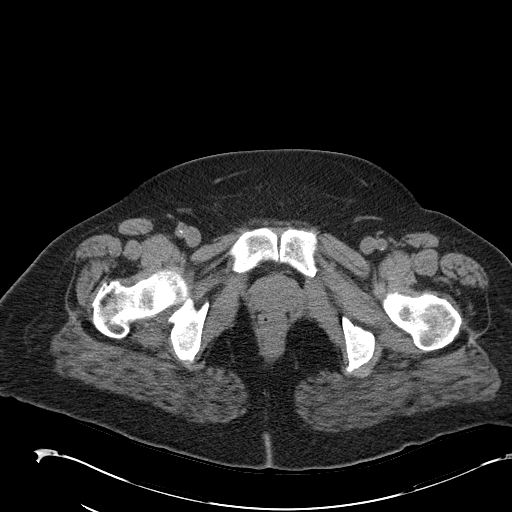
[im 17/89  soft-tissue]
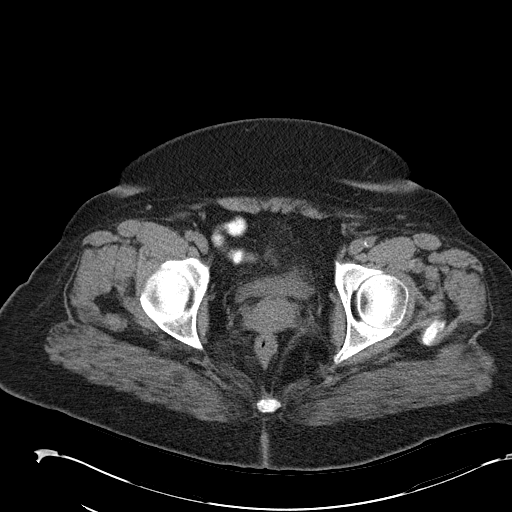
[im 24/89  soft-tissue]
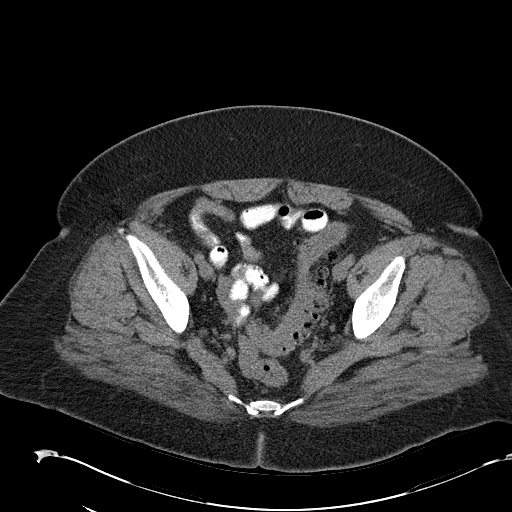
[im 31/89  soft-tissue]
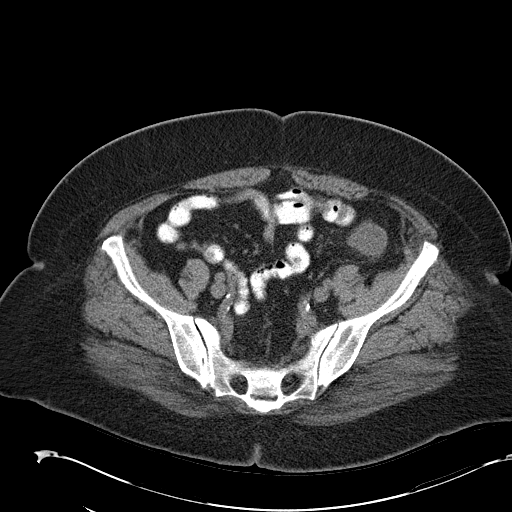
[im 38/89  soft-tissue]
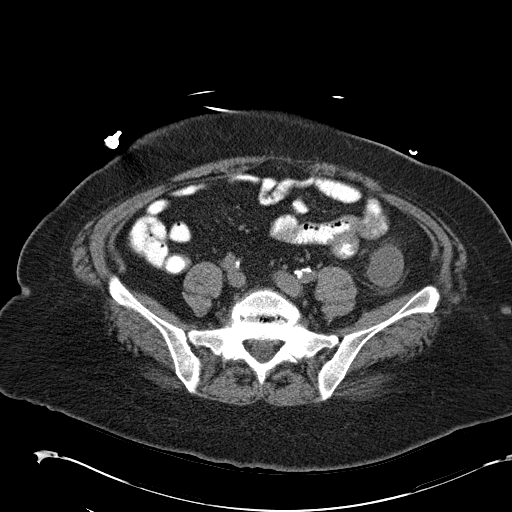
[im 45/89  soft-tissue]
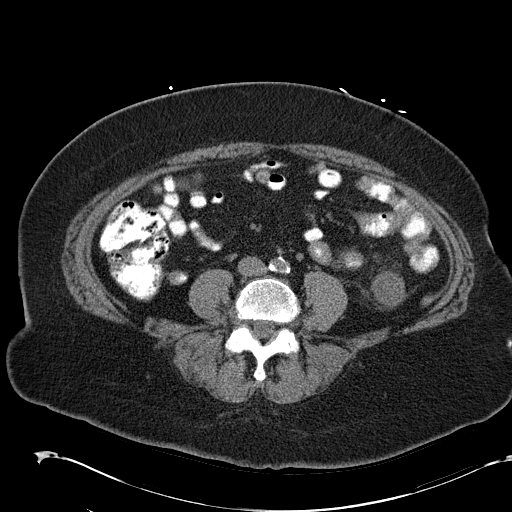
[im 51/89  soft-tissue]
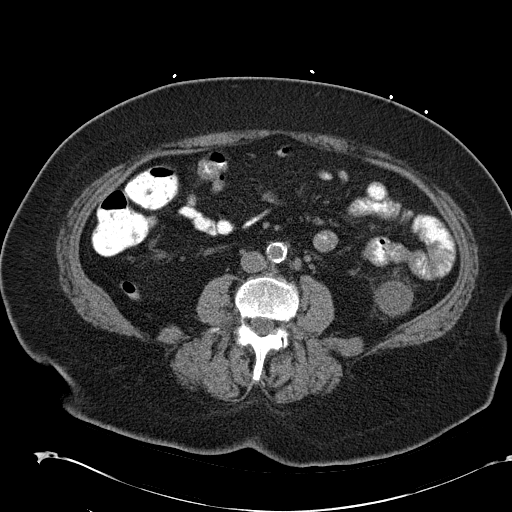
[im 58/89  soft-tissue]
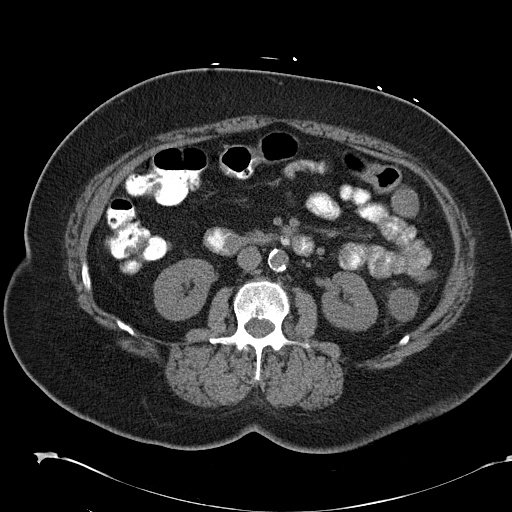
[im 58/89  bone]
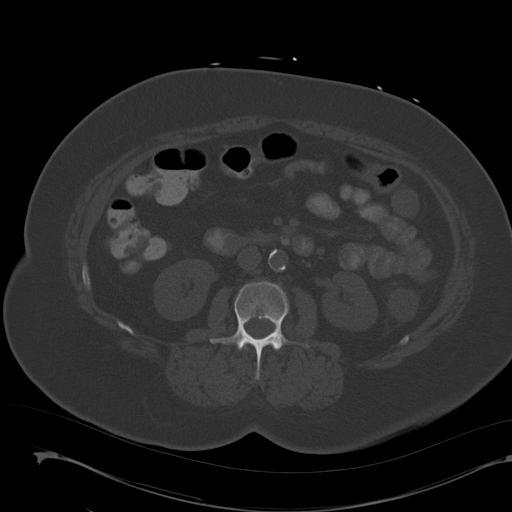
[im 65/89  soft-tissue]
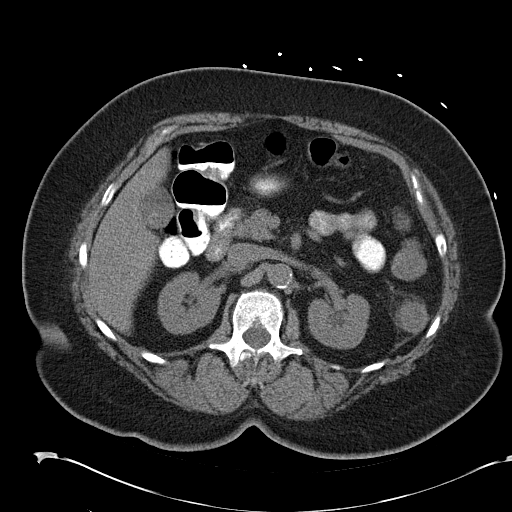
[im 72/89  soft-tissue]
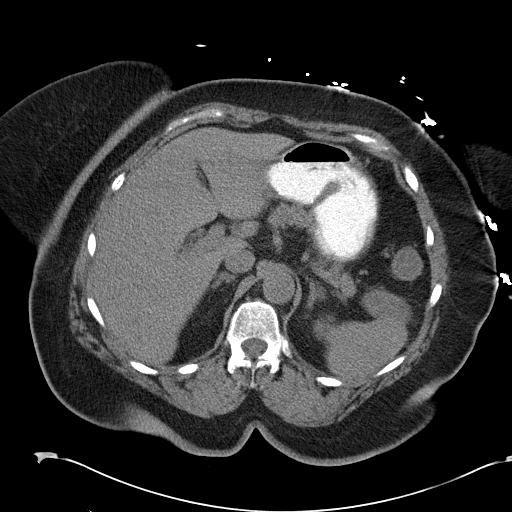
[im 78/89  soft-tissue]
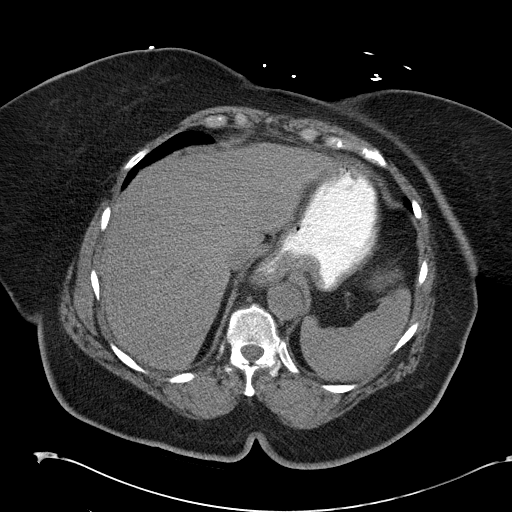
[im 85/89  soft-tissue]
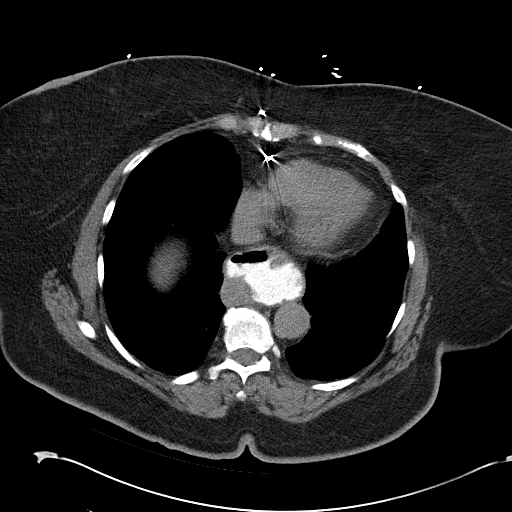

[Series 4: mpr cor (id) · coronal · 0.78mm/px · 3 of 97 slices shown]
[im 33/97  soft-tissue]
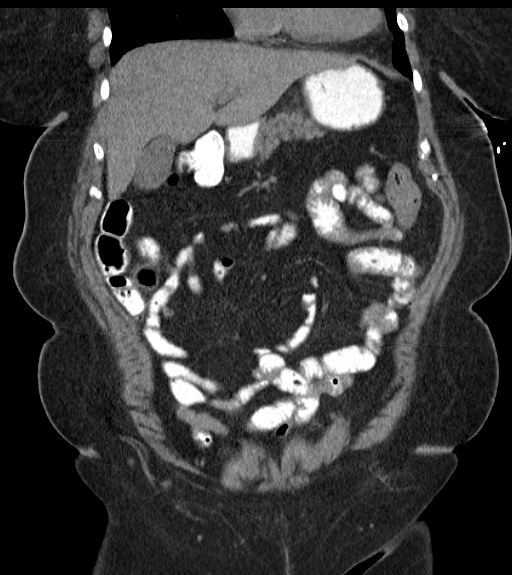
[im 43/97  soft-tissue]
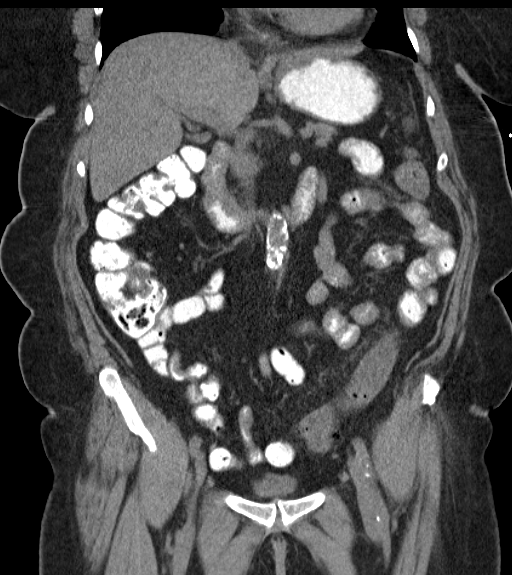
[im 54/97  soft-tissue]
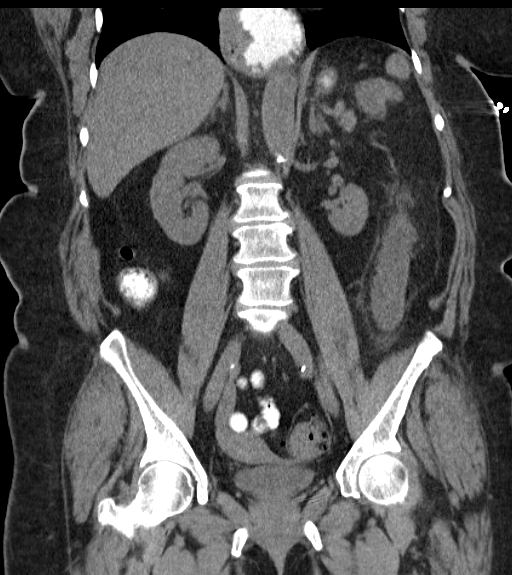

[16 of 46 positions shown; findings below may reference images not displayed]

FINDINGS: 4 mm subpleural nodule in the right middle lung anteriorly is
unchanged since prior study. Given the long interval, this is likely
benign. Mild dependent changes in the lung bases. Moderate-sized
esophageal hiatal hernia.

Vague increased density in the gallbladder suggesting minimally
radiopaque stones. No inflammatory infiltration. The unenhanced
appearance of the liver, spleen, pancreas, adrenal glands, kidneys,
inferior vena cava, and retroperitoneal lymph nodes is unremarkable.
Calcification of abdominal aorta without aneurysm. No gastric wall
thickening. Small bowel appear unremarkable without evidence of
dilatation or wall thickening. Contrast material flows to the colon
without evidence of obstruction. Diffusely stool-filled colon
without distention. The splenic flexure and descending colon
demonstrate mild colonic wall thickening with pericolonic
infiltration suggesting colitis of nonspecific etiology. Similar
changes were present on the previous study. No abscess. No free air
or free fluid in the abdomen.

Pelvis: Diverticulosis of the sigmoid colon without evidence of
diverticulitis. Uterus and ovaries are not enlarged. Bladder is
decompressed. Appendix is normal. No free or loculated pelvic fluid
collections. No pelvic mass or lymphadenopathy. Degenerative changes
in the lumbar spine. No destructive bone lesions.
IMPRESSION: Wall thickening and pericolonic infiltration involving the
descending colon most likely representing infectious or inflammatory
colitis. Ischemic colitis or diverticulitis less likely. Sigmoid
diverticula without diverticulitis. Moderate-sized esophageal hiatal
hernia. Cholelithiasis.

## 2015-12-15 ENCOUNTER — Other Ambulatory Visit (HOSPITAL_COMMUNITY): Payer: Self-pay | Admitting: Internal Medicine

## 2015-12-15 DIAGNOSIS — Z1231 Encounter for screening mammogram for malignant neoplasm of breast: Secondary | ICD-10-CM

## 2016-01-17 ENCOUNTER — Ambulatory Visit (HOSPITAL_COMMUNITY)
Admission: RE | Admit: 2016-01-17 | Discharge: 2016-01-17 | Disposition: A | Discharge status: Home / Self Care | Payer: Medicare Other | Source: Ambulatory Visit | Attending: Internal Medicine | Admitting: Internal Medicine

## 2016-01-17 DIAGNOSIS — Z1231 Encounter for screening mammogram for malignant neoplasm of breast: Secondary | ICD-10-CM | POA: Insufficient documentation

## 2016-01-17 DIAGNOSIS — K219 Gastro-esophageal reflux disease without esophagitis: Secondary | ICD-10-CM | POA: Diagnosis not present

## 2016-01-17 DIAGNOSIS — R928 Other abnormal and inconclusive findings on diagnostic imaging of breast: Secondary | ICD-10-CM | POA: Diagnosis not present

## 2016-01-17 DIAGNOSIS — I10 Essential (primary) hypertension: Secondary | ICD-10-CM | POA: Diagnosis not present

## 2016-01-17 DIAGNOSIS — I2510 Atherosclerotic heart disease of native coronary artery without angina pectoris: Secondary | ICD-10-CM | POA: Diagnosis not present

## 2016-01-17 DIAGNOSIS — Z6833 Body mass index (BMI) 33.0-33.9, adult: Secondary | ICD-10-CM | POA: Diagnosis not present

## 2016-01-22 ENCOUNTER — Other Ambulatory Visit: Payer: Self-pay | Admitting: Internal Medicine

## 2016-01-22 ENCOUNTER — Other Ambulatory Visit: Payer: Self-pay

## 2016-01-22 DIAGNOSIS — R928 Other abnormal and inconclusive findings on diagnostic imaging of breast: Secondary | ICD-10-CM

## 2016-02-06 ENCOUNTER — Ambulatory Visit (HOSPITAL_COMMUNITY)
Admission: RE | Admit: 2016-02-06 | Discharge: 2016-02-06 | Disposition: A | Discharge status: Home / Self Care | Payer: Medicare Other | Source: Ambulatory Visit | Attending: Internal Medicine | Admitting: Internal Medicine

## 2016-02-06 DIAGNOSIS — R928 Other abnormal and inconclusive findings on diagnostic imaging of breast: Secondary | ICD-10-CM | POA: Insufficient documentation

## 2016-04-17 DIAGNOSIS — Z6835 Body mass index (BMI) 35.0-35.9, adult: Secondary | ICD-10-CM | POA: Diagnosis not present

## 2016-04-17 DIAGNOSIS — I10 Essential (primary) hypertension: Secondary | ICD-10-CM | POA: Diagnosis not present

## 2016-04-17 DIAGNOSIS — I2510 Atherosclerotic heart disease of native coronary artery without angina pectoris: Secondary | ICD-10-CM | POA: Diagnosis not present

## 2016-04-17 DIAGNOSIS — J40 Bronchitis, not specified as acute or chronic: Secondary | ICD-10-CM | POA: Diagnosis not present

## 2016-06-07 ENCOUNTER — Emergency Department (HOSPITAL_COMMUNITY): Payer: Medicare Other

## 2016-06-07 ENCOUNTER — Encounter (HOSPITAL_COMMUNITY): Payer: Self-pay | Admitting: Emergency Medicine

## 2016-06-07 ENCOUNTER — Inpatient Hospital Stay (HOSPITAL_COMMUNITY)
Admission: EM | Admit: 2016-06-07 | Discharge: 2016-06-11 | DRG: 191 | Disposition: A | Discharge status: Home / Self Care | Payer: Medicare Other | Attending: Internal Medicine | Admitting: Internal Medicine

## 2016-06-07 DIAGNOSIS — J209 Acute bronchitis, unspecified: Secondary | ICD-10-CM | POA: Diagnosis not present

## 2016-06-07 DIAGNOSIS — R079 Chest pain, unspecified: Secondary | ICD-10-CM | POA: Diagnosis not present

## 2016-06-07 DIAGNOSIS — I110 Hypertensive heart disease with heart failure: Secondary | ICD-10-CM | POA: Diagnosis not present

## 2016-06-07 DIAGNOSIS — J439 Emphysema, unspecified: Secondary | ICD-10-CM

## 2016-06-07 DIAGNOSIS — J440 Chronic obstructive pulmonary disease with acute lower respiratory infection: Secondary | ICD-10-CM | POA: Diagnosis not present

## 2016-06-07 DIAGNOSIS — I2510 Atherosclerotic heart disease of native coronary artery without angina pectoris: Secondary | ICD-10-CM | POA: Diagnosis not present

## 2016-06-07 DIAGNOSIS — I10 Essential (primary) hypertension: Secondary | ICD-10-CM | POA: Diagnosis not present

## 2016-06-07 DIAGNOSIS — I5032 Chronic diastolic (congestive) heart failure: Secondary | ICD-10-CM | POA: Diagnosis not present

## 2016-06-07 DIAGNOSIS — M549 Dorsalgia, unspecified: Secondary | ICD-10-CM | POA: Diagnosis present

## 2016-06-07 DIAGNOSIS — R05 Cough: Secondary | ICD-10-CM | POA: Diagnosis not present

## 2016-06-07 DIAGNOSIS — J441 Chronic obstructive pulmonary disease with (acute) exacerbation: Principal | ICD-10-CM | POA: Diagnosis present

## 2016-06-07 DIAGNOSIS — G8929 Other chronic pain: Secondary | ICD-10-CM | POA: Diagnosis present

## 2016-06-07 DIAGNOSIS — R0602 Shortness of breath: Secondary | ICD-10-CM | POA: Diagnosis not present

## 2016-06-07 DIAGNOSIS — Z79899 Other long term (current) drug therapy: Secondary | ICD-10-CM

## 2016-06-07 DIAGNOSIS — F419 Anxiety disorder, unspecified: Secondary | ICD-10-CM | POA: Diagnosis present

## 2016-06-07 DIAGNOSIS — A419 Sepsis, unspecified organism: Secondary | ICD-10-CM

## 2016-06-07 DIAGNOSIS — Z981 Arthrodesis status: Secondary | ICD-10-CM

## 2016-06-07 DIAGNOSIS — Z87891 Personal history of nicotine dependence: Secondary | ICD-10-CM

## 2016-06-07 DIAGNOSIS — J449 Chronic obstructive pulmonary disease, unspecified: Secondary | ICD-10-CM | POA: Diagnosis present

## 2016-06-07 DIAGNOSIS — Z951 Presence of aortocoronary bypass graft: Secondary | ICD-10-CM

## 2016-06-07 DIAGNOSIS — K219 Gastro-esophageal reflux disease without esophagitis: Secondary | ICD-10-CM | POA: Diagnosis present

## 2016-06-07 DIAGNOSIS — K5900 Constipation, unspecified: Secondary | ICD-10-CM | POA: Diagnosis not present

## 2016-06-07 LAB — BASIC METABOLIC PANEL
Anion gap: 10 (ref 5–15)
BUN: 26 mg/dL — ABNORMAL HIGH (ref 6–20)
CALCIUM: 9.6 mg/dL (ref 8.9–10.3)
CO2: 27 mmol/L (ref 22–32)
CREATININE: 1.42 mg/dL — AB (ref 0.44–1.00)
Chloride: 102 mmol/L (ref 101–111)
GFR calc non Af Amer: 36 mL/min — ABNORMAL LOW (ref >60)
GFR, EST AFRICAN AMERICAN: 42 mL/min — AB (ref >60)
Glucose, Bld: 98 mg/dL (ref 65–99)
Potassium: 4.1 mmol/L (ref 3.5–5.1)
Sodium: 139 mmol/L (ref 135–145)

## 2016-06-07 LAB — CBC WITH DIFFERENTIAL/PLATELET
BASOS ABS: 0.1 10*3/uL (ref 0.0–0.1)
BASOS PCT: 1 %
EOS ABS: 0.4 10*3/uL (ref 0.0–0.7)
Eosinophils Relative: 4 %
HCT: 44.3 % (ref 36.0–46.0)
HEMOGLOBIN: 14.5 g/dL (ref 12.0–15.0)
Lymphocytes Relative: 24 %
Lymphs Abs: 2.1 10*3/uL (ref 0.7–4.0)
MCH: 27.2 pg (ref 26.0–34.0)
MCHC: 32.7 g/dL (ref 30.0–36.0)
MCV: 83.1 fL (ref 78.0–100.0)
Monocytes Absolute: 0.8 10*3/uL (ref 0.1–1.0)
Monocytes Relative: 9 %
NEUTROS PCT: 62 %
Neutro Abs: 5.4 10*3/uL (ref 1.7–7.7)
Platelets: 267 10*3/uL (ref 150–400)
RBC: 5.33 MIL/uL — AB (ref 3.87–5.11)
RDW: 15.2 % (ref 11.5–15.5)
WBC: 8.7 10*3/uL (ref 4.0–10.5)

## 2016-06-07 LAB — URINALYSIS, ROUTINE W REFLEX MICROSCOPIC
Glucose, UA: 100 mg/dL — AB
Hgb urine dipstick: NEGATIVE
Leukocytes, UA: NEGATIVE
NITRITE: NEGATIVE
PH: 5.5 (ref 5.0–8.0)
Protein, ur: NEGATIVE mg/dL
Specific Gravity, Urine: >1.03 — ABNORMAL HIGH (ref 1.005–1.030)

## 2016-06-07 LAB — HEPATIC FUNCTION PANEL
ALT: 14 U/L (ref 14–54)
AST: 20 U/L (ref 15–41)
Albumin: 3.6 g/dL (ref 3.5–5.0)
Alkaline Phosphatase: 57 U/L (ref 38–126)
BILIRUBIN INDIRECT: 0.2 mg/dL — AB (ref 0.3–0.9)
Bilirubin, Direct: 0.1 mg/dL (ref 0.1–0.5)
TOTAL PROTEIN: 5.9 g/dL — AB (ref 6.5–8.1)
Total Bilirubin: 0.3 mg/dL (ref 0.3–1.2)

## 2016-06-07 LAB — LACTIC ACID, PLASMA
LACTIC ACID, VENOUS: 3.7 mmol/L — AB (ref 0.5–1.9)
Lactic Acid, Venous: 1.2 mmol/L (ref 0.5–1.9)
Lactic Acid, Venous: 2.6 mmol/L (ref 0.5–1.9)

## 2016-06-07 LAB — BRAIN NATRIURETIC PEPTIDE: B Natriuretic Peptide: 17 pg/mL (ref 0.0–100.0)

## 2016-06-07 LAB — INFLUENZA PANEL BY PCR (TYPE A & B)
INFLAPCR: NEGATIVE
INFLBPCR: NEGATIVE

## 2016-06-07 LAB — TROPONIN I

## 2016-06-07 LAB — D-DIMER, QUANTITATIVE (NOT AT ARMC): D DIMER QUANT: 0.95 ug{FEU}/mL — AB (ref 0.00–0.50)

## 2016-06-07 MED ORDER — GUAIFENESIN ER 600 MG PO TB12
1200.0000 mg | ORAL_TABLET | Freq: Two times a day (BID) | ORAL | Status: DC
  Administered 2016-06-08 – 2016-06-09 (×4): 1200 mg via ORAL
  Filled 2016-06-07 (×5): qty 2

## 2016-06-07 MED ORDER — ALBUTEROL SULFATE (2.5 MG/3ML) 0.083% IN NEBU: 2.5000 mg | INHALATION_SOLUTION | RESPIRATORY_TRACT | Status: DC

## 2016-06-07 MED ORDER — TECHNETIUM TO 99M ALBUMIN AGGREGATED
4.0000 | Freq: Once | INTRAVENOUS | Status: AC | PRN
  Administered 2016-06-07: 4 via INTRAVENOUS

## 2016-06-07 MED ORDER — VANCOMYCIN HCL 10 G IV SOLR
1500.0000 mg | Freq: Once | INTRAVENOUS | Status: AC
  Administered 2016-06-07: 1500 mg via INTRAVENOUS
  Filled 2016-06-07: qty 1500

## 2016-06-07 MED ORDER — METOPROLOL SUCCINATE ER 25 MG PO TB24
12.5000 mg | ORAL_TABLET | Freq: Two times a day (BID) | ORAL | Status: DC
  Administered 2016-06-08 – 2016-06-10 (×5): 12.5 mg via ORAL
  Administered 2016-06-10: 25 mg via ORAL
  Administered 2016-06-11: 12.5 mg via ORAL
  Filled 2016-06-07 (×7): qty 1

## 2016-06-07 MED ORDER — ONDANSETRON HCL 4 MG/2ML IJ SOLN: 4.0000 mg | Freq: Four times a day (QID) | INTRAMUSCULAR | Status: DC | PRN

## 2016-06-07 MED ORDER — PANTOPRAZOLE SODIUM 40 MG PO TBEC
40.0000 mg | DELAYED_RELEASE_TABLET | Freq: Two times a day (BID) | ORAL | Status: DC
  Administered 2016-06-08 – 2016-06-11 (×7): 40 mg via ORAL
  Filled 2016-06-07 (×7): qty 1

## 2016-06-07 MED ORDER — IPRATROPIUM BROMIDE 0.02 % IN SOLN: 0.5000 mg | Freq: Four times a day (QID) | RESPIRATORY_TRACT | Status: DC

## 2016-06-07 MED ORDER — TRAMADOL-ACETAMINOPHEN 37.5-325 MG PO TABS
1.0000 | ORAL_TABLET | Freq: Two times a day (BID) | ORAL | Status: DC | PRN
  Administered 2016-06-08 – 2016-06-09 (×2): 1 via ORAL
  Filled 2016-06-07 (×2): qty 1

## 2016-06-07 MED ORDER — ALBUTEROL SULFATE (2.5 MG/3ML) 0.083% IN NEBU
5.0000 mg | INHALATION_SOLUTION | Freq: Once | RESPIRATORY_TRACT | Status: AC
  Administered 2016-06-07: 5 mg via RESPIRATORY_TRACT
  Filled 2016-06-07: qty 6

## 2016-06-07 MED ORDER — VANCOMYCIN HCL 10 G IV SOLR
1250.0000 mg | INTRAVENOUS | Status: DC
  Filled 2016-06-07 (×2): qty 1250

## 2016-06-07 MED ORDER — LEVOFLOXACIN IN D5W 750 MG/150ML IV SOLN
750.0000 mg | Freq: Once | INTRAVENOUS | Status: AC
  Administered 2016-06-07: 750 mg via INTRAVENOUS
  Filled 2016-06-07: qty 150

## 2016-06-07 MED ORDER — ACETAMINOPHEN 650 MG RE SUPP: 650.0000 mg | Freq: Four times a day (QID) | RECTAL | Status: DC | PRN

## 2016-06-07 MED ORDER — IPRATROPIUM BROMIDE 0.02 % IN SOLN
1.0000 mg | Freq: Once | RESPIRATORY_TRACT | Status: AC
  Administered 2016-06-07: 1 mg via RESPIRATORY_TRACT
  Filled 2016-06-07: qty 5

## 2016-06-07 MED ORDER — SODIUM CHLORIDE 0.9 % IV BOLUS (SEPSIS)
1000.0000 mL | Freq: Once | INTRAVENOUS | Status: AC
  Administered 2016-06-07: 1000 mL via INTRAVENOUS

## 2016-06-07 MED ORDER — SODIUM CHLORIDE 0.9% FLUSH
3.0000 mL | Freq: Two times a day (BID) | INTRAVENOUS | Status: DC
  Administered 2016-06-08 – 2016-06-11 (×7): 3 mL via INTRAVENOUS

## 2016-06-07 MED ORDER — DEXTROSE 5 % IV SOLN: 2.0000 g | Freq: Once | INTRAVENOUS | Status: DC

## 2016-06-07 MED ORDER — SODIUM CHLORIDE 0.9 % IV SOLN: 250.0000 mL | INTRAVENOUS | Status: DC | PRN

## 2016-06-07 MED ORDER — SODIUM CHLORIDE 0.9% FLUSH: 3.0000 mL | INTRAVENOUS | Status: DC | PRN

## 2016-06-07 MED ORDER — METHYLPREDNISOLONE SODIUM SUCC 125 MG IJ SOLR
60.0000 mg | Freq: Four times a day (QID) | INTRAMUSCULAR | Status: DC
  Administered 2016-06-07 – 2016-06-10 (×10): 60 mg via INTRAVENOUS
  Filled 2016-06-07 (×11): qty 2

## 2016-06-07 MED ORDER — ENOXAPARIN SODIUM 40 MG/0.4ML ~~LOC~~ SOLN
40.0000 mg | SUBCUTANEOUS | Status: DC
  Administered 2016-06-08 – 2016-06-10 (×4): 40 mg via SUBCUTANEOUS
  Filled 2016-06-07 (×4): qty 0.4

## 2016-06-07 MED ORDER — SODIUM CHLORIDE 0.9 % IV SOLN
INTRAVENOUS | Status: AC
Start: 2016-06-07 — End: 2016-06-08
  Administered 2016-06-07: 22:00:00 via INTRAVENOUS

## 2016-06-07 MED ORDER — VANCOMYCIN HCL IN DEXTROSE 1-5 GM/200ML-% IV SOLN: 1000.0000 mg | Freq: Once | INTRAVENOUS | Status: DC

## 2016-06-07 MED ORDER — SODIUM CHLORIDE 0.9 % IV SOLN: INTRAVENOUS | Status: AC

## 2016-06-07 MED ORDER — ALBUTEROL (5 MG/ML) CONTINUOUS INHALATION SOLN
10.0000 mg/h | INHALATION_SOLUTION | Freq: Once | RESPIRATORY_TRACT | Status: AC
  Administered 2016-06-07: 10 mg/h via RESPIRATORY_TRACT
  Filled 2016-06-07: qty 20

## 2016-06-07 MED ORDER — LEVOFLOXACIN IN D5W 750 MG/150ML IV SOLN: 750.0000 mg | INTRAVENOUS | Status: DC

## 2016-06-07 MED ORDER — IPRATROPIUM-ALBUTEROL 0.5-2.5 (3) MG/3ML IN SOLN
3.0000 mL | Freq: Four times a day (QID) | RESPIRATORY_TRACT | Status: DC
  Administered 2016-06-08 – 2016-06-09 (×8): 3 mL via RESPIRATORY_TRACT
  Filled 2016-06-07 (×8): qty 3

## 2016-06-07 MED ORDER — ATORVASTATIN CALCIUM 40 MG PO TABS
40.0000 mg | ORAL_TABLET | Freq: Every evening | ORAL | Status: DC
  Administered 2016-06-08 – 2016-06-10 (×4): 40 mg via ORAL
  Filled 2016-06-07 (×4): qty 1

## 2016-06-07 MED ORDER — DEXTROMETHORPHAN POLISTIREX ER 30 MG/5ML PO SUER
30.0000 mg | Freq: Every evening | ORAL | Status: DC | PRN
  Administered 2016-06-08: 30 mg via ORAL
  Filled 2016-06-07 (×2): qty 5

## 2016-06-07 MED ORDER — ONDANSETRON HCL 4 MG PO TABS: 4.0000 mg | ORAL_TABLET | Freq: Four times a day (QID) | ORAL | Status: DC | PRN

## 2016-06-07 MED ORDER — DEXTROSE 5 % IV SOLN
1.0000 g | Freq: Three times a day (TID) | INTRAVENOUS | Status: DC
  Filled 2016-06-07 (×8): qty 1

## 2016-06-07 MED ORDER — ALBUTEROL SULFATE (2.5 MG/3ML) 0.083% IN NEBU: 2.5000 mg | INHALATION_SOLUTION | Freq: Four times a day (QID) | RESPIRATORY_TRACT | Status: DC

## 2016-06-07 MED ORDER — DEXTROSE 5 % IV SOLN
2.0000 g | Freq: Once | INTRAVENOUS | Status: DC
  Filled 2016-06-07: qty 2

## 2016-06-07 MED ORDER — TECHNETIUM TC 99M DIETHYLENETRIAME-PENTAACETIC ACID
30.0000 | Freq: Once | INTRAVENOUS | Status: AC | PRN
  Administered 2016-06-07: 30 via RESPIRATORY_TRACT

## 2016-06-07 MED ORDER — ACETAMINOPHEN 325 MG PO TABS: 650.0000 mg | ORAL_TABLET | Freq: Four times a day (QID) | ORAL | Status: DC | PRN

## 2016-06-07 NOTE — ED Notes (Signed)
Attempted IV twice without success  

## 2016-06-07 NOTE — ED Notes (Signed)
CRITICAL VALUE ALERT  Critical value received:  Lactic acid 3.7  Date of notification: 06/07/2016  Time of notification:  1635    Nurse who received alert:  Gaetano HawthorneJJ Ciclaly Mulcahey  MD notified (1st page):  Dr. Clarene DukeMcManus notified in ER

## 2016-06-07 NOTE — ED Notes (Signed)
Blood cultures completed.  To hang IV antibiotics.

## 2016-06-07 NOTE — Progress Notes (Addendum)
Pharmacy Antibiotic Note  [name] is a 71 y.o. female admitted on 06/07/2016 with sepsis.  Pharmacy has been consulted for Vancomycin, Aztreonam, and Levaquin dosing.  Plan: Levaquin 750mg  IV every 48 hours F/U cx and clinical progress  Height: 5\' 3"  (160 cm) Weight: 190 lb (86.2 kg) IBW/kg (Calculated) : 52.4  Temp (24hrs), Avg:98.5 F (36.9 C), Min:98.5 F (36.9 C), Max:98.5 F (36.9 C)   Recent Labs Lab 06/07/16 1254 06/07/16 1534  WBC 8.7  --   CREATININE 1.42*  --   LATICACIDVEN 1.2 3.7*    Estimated Creatinine Clearance: 37.3 mL/min (by C-G formula based on SCr of 1.42 mg/dL (H)).    Allergies  Allergen Reactions  . Omnipaque [Iohexol] Other (See Comments)    blisters  . Penicillins Other (See Comments)    Unknown reaction    Antimicrobials this admission: Vancomycin 1/12 >> 1/12 Aztreonam 1/12 >> 1/12 Levaquin 1/12 >>  Dose adjustments this admission: n/a  Microbiology results: 1/12 BCx: pending 1/12 UCx: pending  Thank you for allowing pharmacy to be a part of this patient's care. [name], BS Loura Backharm D, [place] Clinical Pharmacist Pager [phone] 06/07/2016 4:56 PM

## 2016-06-07 NOTE — ED Triage Notes (Signed)
Patient complains of sinus pressure and congestion with cough and congestion that started 2 weeks ago. Patient states fever and chills at home.

## 2016-06-07 NOTE — H&P (Signed)
TRH H&P    Patient Demographics:    [name], is a 71 y.o. female  MRN: [id number]  DOB - [birth year]  Admit Date - 06/07/2016  Referring MD/NP/PA: Dr. Clarene Duke  Outpatient Primary MD for the patient is Avon Gully, MD  Patient coming from: Home  Chief Complaint  Patient presents with  . Cough      HPI:    [name]  is a 71 y.o. female, With history of COPD, CAD status post CABG, chronic back pain, GERD, came to hospital with worsening cough and shortness of breath for past 2 days. Patient says that she has had constant coughing with phlegm which ranged from clear to baige color. She also had one episode of vomiting today though she had diarrhea for 3 days which had stopped yesterday. She denies any chest pain. Denies dysuria but admits to having chills and feeling cold.  In the ED chest x-ray showed no acute abnormality, patient had elevated lactate 3.7 so sepsis protocol was started. The patient but pressure is stable and does not appear to have infectious source other than COPD exacerbation/bronchitis. She also had a VQ scan which was negative for pulmonary embolism    Review of systems:    In addition to the HPI above,  No Fever + chills, No Headache, No changes with Vision or hearing, No problems swallowing food or Liquids, No Chest pain, + Cough, no Shortness of Breath, No Blood in stool or Urine, No dysuria, No new skin rashes or bruises, No new joints pains-aches,  No new weakness, tingling, numbness in any extremity, No recent weight gain or loss, No polyuria, polydypsia or polyphagia, No significant Mental Stressors.  A full 10 point Review of Systems was done, except as stated above, all other Review of Systems were negative.   With Past History of the following :    Past Medical History:  Diagnosis Date  . Anxiety   . CAD (coronary artery disease)    Multivessel status post  CABG 1998  . Cholelithiasis   . Chronic back pain   . Colitis, ischemic (HCC) 03/2011  . COPD (chronic obstructive pulmonary disease) (HCC)   . Diverticulosis   . Essential hypertension   . GERD (gastroesophageal reflux disease)   . GI bleed 03/2011  . Hiatal hernia   . Multiple pulmonary nodules 03/2011  . Nephrolithiasis       Past Surgical History:  Procedure Laterality Date  . ABDOMINAL HYSTERECTOMY    . COLONOSCOPY  04/16/2011   Dr. Darrick Penna: moderate diverticulosis, small internal hemorrhoids, likely ischemic colitis  . CORONARY ARTERY BYPASS GRAFT  1998  . CORONARY STENT PLACEMENT  1995  . ESOPHAGOGASTRODUODENOSCOPY N/A 06/23/2013   Dr. Jena Gauss: cervical esophageal web likely s/p dilation with scope, non-critical Schatzki's ring, large hiatal hernia with Sheria Lang lesions, chronic active gastritis  . SALPINGOOPHORECTOMY Left   . SMALL INTESTINE SURGERY    . SPINAL FUSION     x3      Social History:      Social History  Substance Use  Topics  . Smoking status: Former Smoker    Packs/day: 1.00    Years: 40.00    Types: Cigarettes    Quit date: 09/14/2010  . Smokeless tobacco: Never Used  . Alcohol use No       Family History :     Family History  Problem Relation Age of Onset  . Stomach cancer Mother 25  . Breast cancer Sister 54  . Diabetes Mellitus II Sister   . Colon cancer Neg Hx       Home Medications:   Prior to Admission medications   Medication Sig Start Date End Date Taking? Authorizing Provider  Ascorbic Acid (VITAMIN C) 1000 MG tablet Take 2,000 mg by mouth daily.   Yes Historical Provider, MD  atorvastatin (LIPITOR) 40 MG tablet Take 1 tablet by mouth every evening. 04/24/13  Yes Historical Provider, MD  azithromycin (ZITHROMAX) 250 MG tablet Take 1 tablet by mouth daily. 06/03/16  Yes Historical Provider, MD  cholecalciferol (VITAMIN D) 1000 UNITS tablet Take 1,000 Units by mouth daily.     Yes Historical Provider, MD    dextromethorphan-guaiFENesin (MUCINEX DM) 30-600 MG 12hr tablet Take 2 tablets by mouth 2 (two) times daily as needed for cough (congestion).   Yes Historical Provider, MD  lisinopril (PRINIVIL,ZESTRIL) 10 MG tablet Take 10 mg by mouth daily.     Yes Historical Provider, MD  metoprolol succinate (TOPROL-XL) 25 MG 24 hr tablet Take 12.5 mg by mouth 2 (two) times daily.    Yes Historical Provider, MD  pantoprazole (PROTONIX) 40 MG tablet Take 1 tablet (40 mg total) by mouth 2 (two) times daily. Patient taking differently: Take 40 mg by mouth daily.  06/24/13  Yes Avon Gully, MD  traMADol-acetaminophen (ULTRACET) 37.5-325 MG tablet Take 1 tablet by mouth 2 (two) times daily as needed for moderate pain.   Yes Historical Provider, MD     Allergies:     Allergies  Allergen Reactions  . Omnipaque [Iohexol] Other (See Comments)    blisters  . Penicillins Other (See Comments)    Unknown reaction     Physical Exam:   Vitals  Blood pressure 131/95, pulse 89, temperature 98.5 F (36.9 C), temperature source Oral, resp. rate 13, height 5\' 3"  (1.6 m), weight 86.2 kg (190 lb), SpO2 93 %.  1.  General: Caucasian female in no acute distress  2. Psychiatric:  Intact judgement and  insight, awake alert, oriented x 3.  3. Neurologic: No focal neurological deficits, all cranial nerves intact.Strength 5/5 all 4 extremities, sensation intact all 4 extremities, plantars down going.  4. Eyes :  anicteric sclerae, moist conjunctivae with no lid lag. PERRLA.  5. ENMT:  Oropharynx clear with moist mucous membranes and good dentition  6. Neck:  supple, no cervical lymphadenopathy appriciated, No thyromegaly  7. Respiratory : Normal respiratory effort, bilateral wheezing  8. Cardiovascular : RRR, no gallops, rubs or murmurs, no leg edema  9. Gastrointestinal:  Positive bowel sounds, abdomen soft, non-tender to palpation,no hepatosplenomegaly, no rigidity or guarding       10. Skin:  No  cyanosis, normal texture and turgor, no rash, lesions or ulcers    Data Review:    CBC  Recent Labs Lab 06/07/16 1254  WBC 8.7  HGB 14.5  HCT 44.3  PLT 267  MCV 83.1  MCH 27.2  MCHC 32.7  RDW 15.2  LYMPHSABS 2.1  MONOABS 0.8  EOSABS 0.4  BASOSABS 0.1   ------------------------------------------------------------------------------------------------------------------  Chemistries  Recent Labs Lab 06/07/16 1254 06/07/16 1810  NA 139  --   K 4.1  --   CL 102  --   CO2 27  --   GLUCOSE 98  --   BUN 26*  --   CREATININE 1.42*  --   CALCIUM 9.6  --   AST  --  20  ALT  --  14  ALKPHOS  --  57  BILITOT  --  0.3   ------------------------------------------------------------------------------------------------------------------  ------------------------------------------------------------------------------------------------------------------ GFR: Estimated Creatinine Clearance: 37.3 mL/min (by C-G formula based on SCr of 1.42 mg/dL (H)). Liver Function Tests:  Recent Labs Lab 06/07/16 1810  AST 20  ALT 14  ALKPHOS 57  BILITOT 0.3  PROT 5.9*  ALBUMIN 3.6   No results for input(s): LIPASE, AMYLASE in the last 168 hours. No results for input(s): AMMONIA in the last 168 hours. Coagulation Profile: No results for input(s): INR, PROTIME in the last 168 hours. Cardiac Enzymes:  Recent Labs Lab 06/07/16 1254  TROPONINI <0.03    --------------------------------------------------------------------------------------------------------------- Urine analysis:    Component Value Date/Time   COLORURINE YELLOW 06/07/2016 1644   APPEARANCEUR HAZY (A) 06/07/2016 1644   LABSPEC >1.030 (H) 06/07/2016 1644   PHURINE 5.5 06/07/2016 1644   GLUCOSEU 100 (A) 06/07/2016 1644   HGBUR NEGATIVE 06/07/2016 1644   BILIRUBINUR SMALL (A) 06/07/2016 1644   KETONESUR TRACE (A) 06/07/2016 1644   PROTEINUR NEGATIVE 06/07/2016 1644   UROBILINOGEN 0.2 03/14/2014 1843   NITRITE  NEGATIVE 06/07/2016 1644   LEUKOCYTESUR NEGATIVE 06/07/2016 1644      Imaging Results:    Dg Chest 2 View  Result Date: 06/07/2016 CLINICAL DATA:  Shortness of breath, congestion, cough for 2 weeks EXAM: CHEST  2 VIEW COMPARISON:  03/14/2014 FINDINGS: Prior CABG. Heart and mediastinal contours are within normal limits. No focal opacities or effusions. No acute bony abnormality. Small hiatal hernia. IMPRESSION: No active cardiopulmonary disease. Electronically Signed   By: Charlett NoseKevin  Dover M.D.   On: 06/07/2016 11:51   Nm Pulmonary Perf And Vent  Result Date: 06/07/2016 CLINICAL DATA:  Cough congestion and slight chest pain for several days. EXAM: NUCLEAR MEDICINE VENTILATION - PERFUSION LUNG SCAN TECHNIQUE: Ventilation images were obtained in multiple projections using inhaled aerosol Tc-5453m DTPA. Perfusion images were obtained in multiple projections after intravenous injection of Tc-9353m MAA. RADIOPHARMACEUTICALS:  30.0 mCi Technetium-7453m DTPA aerosol inhalation and 4.0 mCi Technetium-553m MAA IV COMPARISON:  Current chest radiograph FINDINGS: Ventilation: There is somewhat heterogeneous ventilatory uptake in the lungs without a discrete defect. Perfusion: No wedge shaped peripheral perfusion defects to suggest acute pulmonary embolism. IMPRESSION: No evidence of a pulmonary thromboembolism. Electronically Signed   By: Amie Portlandavid  Ormond M.D.   On: 06/07/2016 16:47    My personal review of EKG: Rhythm NSR, nonspecific T-wave abnormality   Assessment & Plan:    Active Problems:   COPD (chronic obstructive pulmonary disease) (HCC)   Essential hypertension   Sepsis (HCC)   Acute bronchitis   1. COPD exacerbation-place under observation, start Solu-Medrol 60 mg IV every 6 hours, DuoNeb nebs every 6 hours, Mucinex 1200 mg by mouth twice a day, dextromethorphan when necessary. We'll also start Levaquin per pharmacy consultation. 2. Hypertension- blood pressure stable, continue metoprolol 12.5 mg twice a  day. Will hold lisinopril at this time 3. CAD status post CABG-stable, continue metoprolol. 4. Chronic back pain- stable, continue Ultracet when necessary 5. Chronic diastolic CHF- does not appear to be in volume overload. BNP 17.0   DVT  Prophylaxis-   Lovenox   AM Labs Ordered, also please review Full Orders  Family Communication: Admission, patients condition and plan of care including tests being ordered have been discussed with the patient who indicate understanding and agree with the plan and Code Status.No family present at bedside  Code Status:  Full code  Admission status: Observation    Time spent in minutes : 55 minutes   Antwaine Boomhower S M.D on 06/07/2016 at 7:44 PM  Between 7am to 7pm - Pager - [phone]. After 7pm go to [email or web address] - password [facility]  [facility] - Office  [phone]

## 2016-06-07 NOTE — ED Provider Notes (Signed)
[facility]ENCY DEPT Provider Note   CSN: [id number] Arrival date & time: 06/07/16  1108     History   Chief Complaint Chief Complaint  Patient presents with  . Cough    HPI [name] is a 71 y.o. female.   Cough     Pt was seen at 1225.  Per pt, c/o gradual onset and persistence of constant runny/stuffy nose, sinus congestion, and cough for the past 2 weeks. Pt states her cough has worsened over the past 4 days. Cough is worse at night. Has been associated with "chills and sweats," wheezing, and chest "pain" which worsens with coughing.  Denies objective fevers, no rash, no palpitations, no SOB, no N/V/D, no abd pain.    Past Medical History:  Diagnosis Date  . Anxiety   . CAD (coronary artery disease)    Multivessel status post CABG 1998  . Cholelithiasis   . Chronic back pain   . Colitis, ischemic (HCC) 03/2011  . COPD (chronic obstructive pulmonary disease) (HCC)   . Diverticulosis   . Essential hypertension   . GERD (gastroesophageal reflux disease)   . GI bleed 03/2011  . Hiatal hernia   . Multiple pulmonary nodules 03/2011  . Nephrolithiasis     Patient Active Problem List   Diagnosis Date Noted  . Essential hypertension 03/17/2014  . Hypokalemia 03/17/2014  . Volume overload 03/17/2014  . Dehydration 03/16/2014  . COPD (chronic obstructive pulmonary disease) (HCC) 03/15/2014  . Chest pain 03/15/2014  . Lower GI bleed 03/14/2014  . GIB (gastrointestinal bleeding) 03/14/2014  . COPD with acute exacerbation (HCC) 06/17/2013  . Colitis 06/03/2011    Class: Hospitalized for    Past Surgical History:  Procedure Laterality Date  . ABDOMINAL HYSTERECTOMY    . COLONOSCOPY  04/16/2011   Dr. Darrick PennaFields: moderate diverticulosis, small internal hemorrhoids, likely ischemic colitis  . CORONARY ARTERY BYPASS GRAFT  1998  . CORONARY STENT PLACEMENT  1995  . ESOPHAGOGASTRODUODENOSCOPY N/A 06/23/2013   Dr. Jena Gaussourk: cervical esophageal web likely s/p dilation with  scope, non-critical Schatzki's ring, large hiatal hernia with Sheria Langameron lesions, chronic active gastritis  . SALPINGOOPHORECTOMY Left   . SMALL INTESTINE SURGERY    . SPINAL FUSION     x3       Home Medications    Prior to Admission medications   Medication Sig Start Date End Date Taking? Authorizing Provider  Ascorbic Acid (VITAMIN C) 1000 MG tablet Take 2,000 mg by mouth daily.    Historical Provider, MD  atorvastatin (LIPITOR) 40 MG tablet Take 1 tablet by mouth every evening. 04/24/13   Historical Provider, MD  azithromycin (ZITHROMAX) 250 MG tablet Take 1 tablet by mouth daily. 06/03/16   Historical Provider, MD  cholecalciferol (VITAMIN D) 1000 UNITS tablet Take 1,000 Units by mouth daily.      Historical Provider, MD  levalbuterol [facility](XOPENEX HFA) 45 MCG/ACT inhaler Inhale 2 puffs into the lungs every 6 (six) hours as needed for wheezing. 06/24/13   Avon Gullyesfaye Fanta, MD  lisinopril (PRINIVIL,ZESTRIL) 10 MG tablet Take 10 mg by mouth daily.      Historical Provider, MD  metoprolol succinate (TOPROL-XL) 25 MG 24 hr tablet Take 25 mg by mouth daily.      Historical Provider, MD  pantoprazole (PROTONIX) 40 MG tablet Take 1 tablet (40 mg total) by mouth 2 (two) times daily. 06/24/13   Avon Gullyesfaye Fanta, MD  traMADol-acetaminophen (ULTRACET) 37.5-325 MG per tablet Take 1 tablet by mouth 2 (two) times daily.  Historical Provider, MD    Family History Family History  Problem Relation Age of Onset  . Stomach cancer Mother 70  . Breast cancer Sister 39  . Diabetes Mellitus II Sister   . Colon cancer Neg Hx     Social History Social History  Substance Use Topics  . Smoking status: Former Smoker    Packs/day: 1.00    Years: 40.00    Types: Cigarettes    Quit date: 09/14/2010  . Smokeless tobacco: Never Used  . Alcohol use No     Allergies   Omnipaque [iohexol] and Penicillins   Review of Systems Review of Systems  Respiratory: Positive for cough.   ROS: Statement: All systems  negative except as marked or noted in the HPI; Constitutional: Negative for fever and +chills. ; ; Eyes: Negative for eye pain, redness and discharge. ; ; ENMT: Negative for ear pain, hoarseness, sore throat. +nasal congestion, sinus pressure and rhinorrhea.; ; Cardiovascular: +CP. Negative for palpitations, diaphoresis, dyspnea and peripheral edema. ; ; Respiratory: +cough, wheezing. Negative for stridor. ; ; Gastrointestinal: Negative for nausea, vomiting, diarrhea, abdominal pain, blood in stool, hematemesis, jaundice and rectal bleeding. . ; ; Genitourinary: Negative for dysuria, flank pain and hematuria. ; ; Musculoskeletal: Negative for back pain and neck pain. Negative for swelling and trauma.; ; Skin: Negative for pruritus, rash, abrasions, blisters, bruising and skin lesion.; ; Neuro: Negative for headache, lightheadedness and neck stiffness. Negative for weakness, altered level of consciousness, altered mental status, extremity weakness, paresthesias, involuntary movement, seizure and syncope.      Physical Exam Updated Vital Signs BP 109/68   Pulse 71   Temp 98.5 F (36.9 C) (Oral)   Resp 19   Ht 5\' 3"  (1.6 m)   Wt 190 lb (86.2 kg)   SpO2 95%   BMI 33.66 kg/m   Physical Exam 1230: Physical examination:  Nursing notes reviewed; Vital signs and O2 SAT reviewed;  Constitutional: Well developed, Well nourished, Well hydrated, In no acute distress; Head:  Normocephalic, atraumatic; Eyes: EOMI, PERRL, No scleral icterus; ENMT: TM's clear bilat. +edemetous nasal turbinates bilat with clear rhinorrhea. Mouth and pharynx without lesions. No tonsillar exudates. No intra-oral edema. No submandibular or sublingual edema. No hoarse voice, no drooling, no stridor. No pain with manipulation of larynx. No trismus. Mouth and pharynx normal, Mucous membranes moist; Neck: Supple, Full range of motion, No lymphadenopathy; Cardiovascular: Regular rate and rhythm, No gallop; Respiratory: Breath sounds coarse  & equal bilaterally, scattered wheezes. No audible wheezing. +moist cough. Speaking full sentences with ease, Normal respiratory effort/excursion; Chest: Nontender, Movement normal; Abdomen: Soft, Nontender, Nondistended, Normal bowel sounds; Genitourinary: No CVA tenderness; Extremities: Pulses normal, No tenderness, No edema, No calf edema or asymmetry.; Neuro: AA&Ox3, Major CN grossly intact.  Speech clear. No gross focal motor or sensory deficits in extremities.; Skin: Color normal, Warm, Dry.   ED Treatments / Results  Labs (all labs ordered are listed, but only abnormal results are displayed)   EKG  EKG Interpretation  Date/Time:  Friday June 07 2016 11:36:05 EST Ventricular Rate:  80 PR Interval:  128 QRS Duration: 76 QT Interval:  374 QTC Calculation: 431 R Axis:   35 Text Interpretation:  Normal sinus rhythm Nonspecific T wave abnormality When compared with ECG of 03/14/2014 and 06/17/2013 No significant change was found Confirmed by [facility]  MD, Nicholos Johns [phone]) on 06/07/2016 1:06:15 PM       Radiology   Procedures Procedures (including critical care time)  Medications  Ordered in ED Medications  albuterol (PROVENTIL) (2.5 MG/3ML) 0.083% nebulizer solution 5 mg (not administered)  albuterol (PROVENTIL,VENTOLIN) solution continuous neb (not administered)  ipratropium (ATROVENT) nebulizer solution 1 mg (not administered)     Initial Impression / Assessment and Plan / ED Course  I have reviewed the triage vital signs and the nursing notes.  Pertinent labs & imaging results that were available during my care of the patient were reviewed by me and considered in my medical decision making (see chart for details).  MDM Reviewed: previous chart, nursing note and vitals Reviewed previous: labs and ECG Interpretation: labs, ECG and x-ray Total time providing critical care: 30-74 minutes. This excludes time spent performing separately reportable procedures and  services. Consults: admitting MD   CRITICAL CARE Performed by: Laray Anger Total critical care time: 35 minutes Critical care time was exclusive of separately billable procedures and treating other patients. Critical care was necessary to treat or prevent imminent or life-threatening deterioration. Critical care was time spent personally by me on the following activities: development of treatment plan with patient and/or surrogate as well as nursing, discussions with consultants, evaluation of patient's response to treatment, examination of patient, obtaining history from patient or surrogate, ordering and performing treatments and interventions, ordering and review of laboratory studies, ordering and review of radiographic studies, pulse oximetry and re-evaluation of patient's condition.    Results for orders placed or performed during the hospital encounter of 06/07/16  Blood Culture (routine x 2)  Result Value Ref Range   Specimen Description BLOOD RIGHT FOREARM    Special Requests BOTTLES DRAWN AEROBIC AND ANAEROBIC 4CC EACH    Culture PENDING    Report Status PENDING   Blood Culture (routine x 2)  Result Value Ref Range   Specimen Description BLOOD RIGHT HAND    Special Requests BOTTLES DRAWN AEROBIC AND ANAEROBIC 4CC EACH    Culture PENDING    Report Status PENDING   Lactic acid, plasma  Result Value Ref Range   Lactic Acid, Venous 1.2 0.5 - 1.9 mmol/L  Lactic acid, plasma  Result Value Ref Range   Lactic Acid, Venous 3.7 (HH) 0.5 - 1.9 mmol/L  Troponin I  Result Value Ref Range   Troponin I <0.03 <0.03 ng/mL  Brain natriuretic peptide  Result Value Ref Range   B Natriuretic Peptide 17.0 0.0 - 100.0 pg/mL  Basic metabolic panel  Result Value Ref Range   Sodium 139 135 - 145 mmol/L   Potassium 4.1 3.5 - 5.1 mmol/L   Chloride 102 101 - 111 mmol/L   CO2 27 22 - 32 mmol/L   Glucose, Bld 98 65 - 99 mg/dL   BUN 26 (H) 6 - 20 mg/dL   Creatinine, Ser 1.61 (H) 0.44 -  1.00 mg/dL   Calcium 9.6 8.9 - 09.6 mg/dL   GFR calc non Af Amer 36 (L) >60 mL/min   GFR calc Af Amer 42 (L) >60 mL/min   Anion gap 10 5 - 15  CBC with Differential  Result Value Ref Range   WBC 8.7 4.0 - 10.5 K/uL   RBC 5.33 (H) 3.87 - 5.11 MIL/uL   Hemoglobin 14.5 12.0 - 15.0 g/dL   HCT 04.5 40.9 - 81.1 %   MCV 83.1 78.0 - 100.0 fL   MCH 27.2 26.0 - 34.0 pg   MCHC 32.7 30.0 - 36.0 g/dL   RDW 91.4 78.2 - 95.6 %   Platelets 267 150 - 400 K/uL   Neutrophils Relative %  62 %   Neutro Abs 5.4 1.7 - 7.7 K/uL   Lymphocytes Relative 24 %   Lymphs Abs 2.1 0.7 - 4.0 K/uL   Monocytes Relative 9 %   Monocytes Absolute 0.8 0.1 - 1.0 K/uL   Eosinophils Relative 4 %   Eosinophils Absolute 0.4 0.0 - 0.7 K/uL   Basophils Relative 1 %   Basophils Absolute 0.1 0.0 - 0.1 K/uL  D-dimer, quantitative  Result Value Ref Range   D-Dimer, Quant 0.95 (H) 0.00 - 0.50 ug/mL-FEU  Influenza panel by PCR (type A & B, H1N1)  Result Value Ref Range   Influenza A By PCR NEGATIVE NEGATIVE   Influenza B By PCR NEGATIVE NEGATIVE  Urinalysis, Routine w reflex microscopic  Result Value Ref Range   Color, Urine YELLOW YELLOW   APPearance HAZY (A) CLEAR   Specific Gravity, Urine >1.030 (H) 1.005 - 1.030   pH 5.5 5.0 - 8.0   Glucose, UA 100 (A) NEGATIVE mg/dL   Hgb urine dipstick NEGATIVE NEGATIVE   Bilirubin Urine SMALL (A) NEGATIVE   Ketones, ur TRACE (A) NEGATIVE mg/dL   Protein, ur NEGATIVE NEGATIVE mg/dL   Nitrite NEGATIVE NEGATIVE   Leukocytes, UA NEGATIVE NEGATIVE  Hepatic function panel  Result Value Ref Range   Total Protein 5.9 (L) 6.5 - 8.1 g/dL   Albumin 3.6 3.5 - 5.0 g/dL   AST 20 15 - 41 U/L   ALT 14 14 - 54 U/L   Alkaline Phosphatase 57 38 - 126 U/L   Total Bilirubin 0.3 0.3 - 1.2 mg/dL   Bilirubin, Direct 0.1 0.1 - 0.5 mg/dL   Indirect Bilirubin 0.2 (L) 0.3 - 0.9 mg/dL   Dg Chest 2 View Result Date: 06/07/2016 CLINICAL DATA:  Shortness of breath, congestion, cough for 2 weeks EXAM:  CHEST  2 VIEW COMPARISON:  03/14/2014 FINDINGS: Prior CABG. Heart and mediastinal contours are within normal limits. No focal opacities or effusions. No acute bony abnormality. Small hiatal hernia. IMPRESSION: No active cardiopulmonary disease. Electronically Signed   By: Charlett Nose M.D.   On: 06/07/2016 11:51   Nm Pulmonary Perf And Vent Result Date: 06/07/2016 CLINICAL DATA:  Cough congestion and slight chest pain for several days. EXAM: NUCLEAR MEDICINE VENTILATION - PERFUSION LUNG SCAN TECHNIQUE: Ventilation images were obtained in multiple projections using inhaled aerosol Tc-50m DTPA. Perfusion images were obtained in multiple projections after intravenous injection of Tc-39m MAA. RADIOPHARMACEUTICALS:  30.0 mCi Technetium-64m DTPA aerosol inhalation and 4.0 mCi Technetium-84m MAA IV COMPARISON:  Current chest radiograph FINDINGS: Ventilation: There is somewhat heterogeneous ventilatory uptake in the lungs without a discrete defect. Perfusion: No wedge shaped peripheral perfusion defects to suggest acute pulmonary embolism. IMPRESSION: No evidence of a pulmonary thromboembolism. Electronically Signed   By: Amie Portland M.D.   On: 06/07/2016 16:47    1230:  On arrival, pt wheezing: hour long neb started while workup ordered.   1500:  BUN/Cr elevated from baseline. 2nd lactic acid elevated, SBP dropped to 80's.  Code Sepsis called. Influenza panel ordered in addition to usual sepsis labs, IV abx and IVF bolus.  1920:  V/Q scan reassuring. BP improving with IVF boluses. Remains NAD, RRR, abd soft/NT. Lungs coarse with scattered/faint wheezing, no audible wheezing, resps without distress, pt on telephone talking full sentences. Dx and testing d/w pt.  Questions answered.  Verb understanding, agreeable to admit.   T/C to [facility] Dr. Sharl Ma, case discussed, including:  HPI, pertinent PM/SHx, VS/PE, dx testing, ED course and treatment:  Agreeable to admit, requests to write temporary orders, obtain  observation medical bed to Dr. Letitia Neri service.   Final Clinical Impressions(s) / ED Diagnoses   Final diagnoses:  None    New Prescriptions New Prescriptions   No medications on file      Samuel Jester, DO 06/11/16 1659

## 2016-06-08 ENCOUNTER — Encounter (HOSPITAL_COMMUNITY): Payer: Self-pay

## 2016-06-08 DIAGNOSIS — J441 Chronic obstructive pulmonary disease with (acute) exacerbation: Secondary | ICD-10-CM | POA: Diagnosis present

## 2016-06-08 DIAGNOSIS — F419 Anxiety disorder, unspecified: Secondary | ICD-10-CM | POA: Diagnosis present

## 2016-06-08 DIAGNOSIS — G8929 Other chronic pain: Secondary | ICD-10-CM | POA: Diagnosis present

## 2016-06-08 DIAGNOSIS — J440 Chronic obstructive pulmonary disease with acute lower respiratory infection: Secondary | ICD-10-CM | POA: Diagnosis present

## 2016-06-08 DIAGNOSIS — K5900 Constipation, unspecified: Secondary | ICD-10-CM | POA: Diagnosis not present

## 2016-06-08 DIAGNOSIS — M549 Dorsalgia, unspecified: Secondary | ICD-10-CM | POA: Diagnosis present

## 2016-06-08 DIAGNOSIS — I5032 Chronic diastolic (congestive) heart failure: Secondary | ICD-10-CM | POA: Diagnosis present

## 2016-06-08 DIAGNOSIS — I2510 Atherosclerotic heart disease of native coronary artery without angina pectoris: Secondary | ICD-10-CM | POA: Diagnosis present

## 2016-06-08 DIAGNOSIS — Z981 Arthrodesis status: Secondary | ICD-10-CM | POA: Diagnosis not present

## 2016-06-08 DIAGNOSIS — J449 Chronic obstructive pulmonary disease, unspecified: Secondary | ICD-10-CM | POA: Diagnosis not present

## 2016-06-08 DIAGNOSIS — R0602 Shortness of breath: Secondary | ICD-10-CM | POA: Diagnosis not present

## 2016-06-08 DIAGNOSIS — K219 Gastro-esophageal reflux disease without esophagitis: Secondary | ICD-10-CM | POA: Diagnosis present

## 2016-06-08 DIAGNOSIS — I110 Hypertensive heart disease with heart failure: Secondary | ICD-10-CM | POA: Diagnosis present

## 2016-06-08 DIAGNOSIS — Z951 Presence of aortocoronary bypass graft: Secondary | ICD-10-CM | POA: Diagnosis not present

## 2016-06-08 DIAGNOSIS — J209 Acute bronchitis, unspecified: Secondary | ICD-10-CM | POA: Diagnosis present

## 2016-06-08 DIAGNOSIS — I10 Essential (primary) hypertension: Secondary | ICD-10-CM | POA: Diagnosis not present

## 2016-06-08 DIAGNOSIS — Z87891 Personal history of nicotine dependence: Secondary | ICD-10-CM | POA: Diagnosis not present

## 2016-06-08 DIAGNOSIS — Z79899 Other long term (current) drug therapy: Secondary | ICD-10-CM | POA: Diagnosis not present

## 2016-06-08 LAB — COMPREHENSIVE METABOLIC PANEL
ALK PHOS: 62 U/L (ref 38–126)
ALT: 17 U/L (ref 14–54)
AST: 29 U/L (ref 15–41)
Albumin: 4 g/dL (ref 3.5–5.0)
Anion gap: 10 (ref 5–15)
BILIRUBIN TOTAL: 0.3 mg/dL (ref 0.3–1.2)
BUN: 18 mg/dL (ref 6–20)
CALCIUM: 8.9 mg/dL (ref 8.9–10.3)
CO2: 21 mmol/L — ABNORMAL LOW (ref 22–32)
CREATININE: 0.97 mg/dL (ref 0.44–1.00)
Chloride: 108 mmol/L (ref 101–111)
GFR calc non Af Amer: 57 mL/min — ABNORMAL LOW (ref >60)
Glucose, Bld: 199 mg/dL — ABNORMAL HIGH (ref 65–99)
Potassium: 4 mmol/L (ref 3.5–5.1)
Sodium: 139 mmol/L (ref 135–145)
TOTAL PROTEIN: 7.2 g/dL (ref 6.5–8.1)

## 2016-06-08 LAB — CBC
HCT: 41.7 % (ref 36.0–46.0)
Hemoglobin: 13.4 g/dL (ref 12.0–15.0)
MCH: 26.9 pg (ref 26.0–34.0)
MCHC: 32.1 g/dL (ref 30.0–36.0)
MCV: 83.7 fL (ref 78.0–100.0)
Platelets: 276 10*3/uL (ref 150–400)
RBC: 4.98 MIL/uL (ref 3.87–5.11)
RDW: 15.3 % (ref 11.5–15.5)
WBC: 8.2 10*3/uL (ref 4.0–10.5)

## 2016-06-08 LAB — LACTIC ACID, PLASMA: LACTIC ACID, VENOUS: 3.9 mmol/L — AB (ref 0.5–1.9)

## 2016-06-08 MED ORDER — LEVOFLOXACIN IN D5W 750 MG/150ML IV SOLN
750.0000 mg | INTRAVENOUS | Status: DC
  Administered 2016-06-08 – 2016-06-09 (×2): 750 mg via INTRAVENOUS
  Filled 2016-06-08 (×2): qty 150

## 2016-06-08 NOTE — Progress Notes (Signed)
. Subjective: Patient was admitted yesterday due to cough, congestion and shortness breath. Her lactate was elevated and the possibility of sepsis was considered but patient hemodynamically stable and no source of infection  Objective: Vital signs in last 24 hours: Temp:  [97.8 F (36.6 C)-98.7 F (37.1 C)] 97.8 F (36.6 C) (01/13 0630) Pulse Rate:  [71-100] 93 (01/13 0630) Resp:  [13-24] 18 (01/13 0630) BP: (86-140)/(52-113) 132/71 (01/13 0630) SpO2:  [91 %-100 %] 91 % (01/13 0800) Weight:  [86.2 kg (190 lb)] 86.2 kg (190 lb) (01/12 2132) Weight change:  Last BM Date: 06/07/16  Intake/Output from previous day: 01/12 0701 - 01/13 0700 In: 2775 [I.V.:775; IV Piggyback:2000] Out: -   PHYSICAL EXAM General appearance: alert and no distress Resp: diminished breath sounds bilaterally and wheezes bilaterally Cardio: S1, S2 normal GI: soft, non-tender; bowel sounds normal; no masses,  no organomegaly Extremities: extremities normal, atraumatic, no cyanosis or edema  Lab Results:  Results for orders placed or performed during the hospital encounter of 06/07/16 (from the past 48 hour(s))  Lactic acid, plasma     Status: None   Collection Time: 06/07/16 12:54 PM  Result Value Ref Range   Lactic Acid, Venous 1.2 0.5 - 1.9 mmol/L  Troponin I     Status: None   Collection Time: 06/07/16 12:54 PM  Result Value Ref Range   Troponin I <0.03 <0.03 ng/mL  Brain natriuretic peptide     Status: None   Collection Time: 06/07/16 12:54 PM  Result Value Ref Range   B Natriuretic Peptide 17.0 0.0 - 100.0 pg/mL  Basic metabolic panel     Status: Abnormal   Collection Time: 06/07/16 12:54 PM  Result Value Ref Range   Sodium 139 135 - 145 mmol/L   Potassium 4.1 3.5 - 5.1 mmol/L   Chloride 102 101 - 111 mmol/L   CO2 27 22 - 32 mmol/L   Glucose, Bld 98 65 - 99 mg/dL   BUN 26 (H) 6 - 20 mg/dL   Creatinine, Ser 1.42 (H) 0.44 - 1.00 mg/dL   Calcium 9.6 8.9 - 10.3 mg/dL   GFR calc non Af Amer  36 (L) >60 mL/min   GFR calc Af Amer 42 (L) >60 mL/min    Comment: (NOTE) The eGFR has been calculated using the CKD EPI equation. This calculation has not been validated in all clinical situations. eGFR's persistently <60 mL/min signify possible Chronic Kidney Disease.    Anion gap 10 5 - 15  CBC with Differential     Status: Abnormal   Collection Time: 06/07/16 12:54 PM  Result Value Ref Range   WBC 8.7 4.0 - 10.5 K/uL   RBC 5.33 (H) 3.87 - 5.11 MIL/uL   Hemoglobin 14.5 12.0 - 15.0 g/dL   HCT 44.3 36.0 - 46.0 %   MCV 83.1 78.0 - 100.0 fL   MCH 27.2 26.0 - 34.0 pg   MCHC 32.7 30.0 - 36.0 g/dL   RDW 15.2 11.5 - 15.5 %   Platelets 267 150 - 400 K/uL   Neutrophils Relative % 62 %   Neutro Abs 5.4 1.7 - 7.7 K/uL   Lymphocytes Relative 24 %   Lymphs Abs 2.1 0.7 - 4.0 K/uL   Monocytes Relative 9 %   Monocytes Absolute 0.8 0.1 - 1.0 K/uL   Eosinophils Relative 4 %   Eosinophils Absolute 0.4 0.0 - 0.7 K/uL   Basophils Relative 1 %   Basophils Absolute 0.1 0.0 - 0.1 K/uL  D-dimer, quantitative     Status: Abnormal   Collection Time: 06/07/16 12:54 PM  Result Value Ref Range   D-Dimer, Quant 0.95 (H) 0.00 - 0.50 ug/mL-FEU    Comment: (NOTE) At the manufacturer cut-off of 0.50 ug/mL FEU, this assay has been documented to exclude PE with a sensitivity and negative predictive value of 97 to 99%.  At this time, this assay has not been approved by the FDA to exclude DVT/VTE. Results should be correlated with clinical presentation.   Lactic acid, plasma     Status: Abnormal   Collection Time: 06/07/16  3:34 PM  Result Value Ref Range   Lactic Acid, Venous 3.7 (HH) 0.5 - 1.9 mmol/L    Comment: CRITICAL RESULT CALLED TO, READ BACK BY AND VERIFIED WITH: CRUISE,JJ ON 06/07/16 AT 1625 BY LOY,C   Influenza panel by PCR (type A & B, H1N1)     Status: None   Collection Time: 06/07/16  4:44 PM  Result Value Ref Range   Influenza A By PCR NEGATIVE NEGATIVE   Influenza B By PCR NEGATIVE  NEGATIVE    Comment: (NOTE) The Xpert Xpress Flu assay is intended as an aid in the diagnosis of  influenza and should not be used as a sole basis for treatment.  This  assay is FDA approved for nasopharyngeal swab specimens only. Nasal  washings and aspirates are unacceptable for Xpert Xpress Flu testing.   Urinalysis, Routine w reflex microscopic     Status: Abnormal   Collection Time: 06/07/16  4:44 PM  Result Value Ref Range   Color, Urine YELLOW YELLOW   APPearance HAZY (A) CLEAR   Specific Gravity, Urine >1.030 (H) 1.005 - 1.030   pH 5.5 5.0 - 8.0   Glucose, UA 100 (A) NEGATIVE mg/dL   Hgb urine dipstick NEGATIVE NEGATIVE   Bilirubin Urine SMALL (A) NEGATIVE   Ketones, ur TRACE (A) NEGATIVE mg/dL   Protein, ur NEGATIVE NEGATIVE mg/dL   Nitrite NEGATIVE NEGATIVE   Leukocytes, UA NEGATIVE NEGATIVE    Comment: Microscopic not done on urines with negative protein, blood, leukocytes, nitrite, or glucose < 500 mg/dL.  Blood Culture (routine x 2)     Status: None (Preliminary result)   Collection Time: 06/07/16  6:10 PM  Result Value Ref Range   Specimen Description BLOOD RIGHT FOREARM    Special Requests BOTTLES DRAWN AEROBIC AND ANAEROBIC 4CC EACH    Culture PENDING    Report Status PENDING   Hepatic function panel     Status: Abnormal   Collection Time: 06/07/16  6:10 PM  Result Value Ref Range   Total Protein 5.9 (L) 6.5 - 8.1 g/dL   Albumin 3.6 3.5 - 5.0 g/dL   AST 20 15 - 41 U/L   ALT 14 14 - 54 U/L   Alkaline Phosphatase 57 38 - 126 U/L   Total Bilirubin 0.3 0.3 - 1.2 mg/dL   Bilirubin, Direct 0.1 0.1 - 0.5 mg/dL   Indirect Bilirubin 0.2 (L) 0.3 - 0.9 mg/dL  Blood Culture (routine x 2)     Status: None (Preliminary result)   Collection Time: 06/07/16  6:15 PM  Result Value Ref Range   Specimen Description BLOOD RIGHT HAND    Special Requests BOTTLES DRAWN AEROBIC AND ANAEROBIC 4CC EACH    Culture PENDING    Report Status PENDING   Lactic acid, plasma     Status:  Abnormal   Collection Time: 06/07/16  9:53 PM  Result Value Ref  Range   Lactic Acid, Venous 2.6 (HH) 0.5 - 1.9 mmol/L    Comment: CRITICAL RESULT CALLED TO, READ BACK BY AND VERIFIED WITH:  RUSSELL,L @ 2339 ON 06/07/16 BY JUW   Lactic acid, plasma     Status: Abnormal   Collection Time: 06/08/16  2:18 AM  Result Value Ref Range   Lactic Acid, Venous 3.9 (HH) 0.5 - 1.9 mmol/L    Comment: CRITICAL RESULT CALLED TO, READ BACK BY AND VERIFIED WITH:  RUSSELL,L @ 0322 ON 06/08/16 BY JUW   CBC     Status: None   Collection Time: 06/08/16  7:03 AM  Result Value Ref Range   WBC 8.2 4.0 - 10.5 K/uL   RBC 4.98 3.87 - 5.11 MIL/uL   Hemoglobin 13.4 12.0 - 15.0 g/dL   HCT 41.7 36.0 - 46.0 %   MCV 83.7 78.0 - 100.0 fL   MCH 26.9 26.0 - 34.0 pg   MCHC 32.1 30.0 - 36.0 g/dL   RDW 15.3 11.5 - 15.5 %   Platelets 276 150 - 400 K/uL  Comprehensive metabolic panel     Status: Abnormal   Collection Time: 06/08/16  7:03 AM  Result Value Ref Range   Sodium 139 135 - 145 mmol/L   Potassium 4.0 3.5 - 5.1 mmol/L   Chloride 108 101 - 111 mmol/L   CO2 21 (L) 22 - 32 mmol/L   Glucose, Bld 199 (H) 65 - 99 mg/dL   BUN 18 6 - 20 mg/dL   Creatinine, Ser 0.97 0.44 - 1.00 mg/dL   Calcium 8.9 8.9 - 10.3 mg/dL   Total Protein 7.2 6.5 - 8.1 g/dL   Albumin 4.0 3.5 - 5.0 g/dL   AST 29 15 - 41 U/L   ALT 17 14 - 54 U/L   Alkaline Phosphatase 62 38 - 126 U/L   Total Bilirubin 0.3 0.3 - 1.2 mg/dL   GFR calc non Af Amer 57 (L) >60 mL/min   GFR calc Af Amer >60 >60 mL/min    Comment: (NOTE) The eGFR has been calculated using the CKD EPI equation. This calculation has not been validated in all clinical situations. eGFR's persistently <60 mL/min signify possible Chronic Kidney Disease.    Anion gap 10 5 - 15    ABGS No results for input(s): PHART, PO2ART, TCO2, HCO3 in the last 72 hours.  Invalid input(s): PCO2 CULTURES Recent Results (from the past 240 hour(s))  Blood Culture (routine x 2)     Status: None  (Preliminary result)   Collection Time: 06/07/16  6:10 PM  Result Value Ref Range Status   Specimen Description BLOOD RIGHT FOREARM  Final   Special Requests BOTTLES DRAWN AEROBIC AND ANAEROBIC 4CC EACH  Final   Culture PENDING  Incomplete   Report Status PENDING  Incomplete  Blood Culture (routine x 2)     Status: None (Preliminary result)   Collection Time: 06/07/16  6:15 PM  Result Value Ref Range Status   Specimen Description BLOOD RIGHT HAND  Final   Special Requests BOTTLES DRAWN AEROBIC AND ANAEROBIC 4CC EACH  Final   Culture PENDING  Incomplete   Report Status PENDING  Incomplete   Studies/Results: Dg Chest 2 View  Result Date: 06/07/2016 CLINICAL DATA:  Shortness of breath, congestion, cough for 2 weeks EXAM: CHEST  2 VIEW COMPARISON:  03/14/2014 FINDINGS: Prior CABG. Heart and mediastinal contours are within normal limits. No focal opacities or effusions. No acute bony abnormality. Small hiatal hernia. IMPRESSION:  No active cardiopulmonary disease. Electronically Signed   By: Rolm Baptise M.D.   On: 06/07/2016 11:51   Nm Pulmonary Perf And Vent  Result Date: 06/07/2016 CLINICAL DATA:  Cough congestion and slight chest pain for several days. EXAM: NUCLEAR MEDICINE VENTILATION - PERFUSION LUNG SCAN TECHNIQUE: Ventilation images were obtained in multiple projections using inhaled aerosol Tc-68mDTPA. Perfusion images were obtained in multiple projections after intravenous injection of Tc-952mAA. RADIOPHARMACEUTICALS:  30.0 mCi Technetium-9920mPA aerosol inhalation and 4.0 mCi Technetium-33m91m IV COMPARISON:  Current chest radiograph FINDINGS: Ventilation: There is somewhat heterogeneous ventilatory uptake in the lungs without a discrete defect. Perfusion: No wedge shaped peripheral perfusion defects to suggest acute pulmonary embolism. IMPRESSION: No evidence of a pulmonary thromboembolism. Electronically Signed   By: DaviLajean Manes.   On: 06/07/2016 16:47    Medications: I  have reviewed the patient's current medications.  Assesment:  Active Problems:   COPD (chronic obstructive pulmonary disease) (HCC)   Essential hypertension   S[place] (HCC[place]    Plan:  Medications reviewed Will continue the combination antibiotics for now and will scale back as patient improves Continue IV steroid and nebulizer Continue regular treatment.    LOS: 0 days   Amandeep Hogston 06/08/2016, 10:56 AM

## 2016-06-09 ENCOUNTER — Encounter (HOSPITAL_COMMUNITY): Payer: Self-pay | Admitting: *Deleted

## 2016-06-09 LAB — URINE CULTURE

## 2016-06-09 MED ORDER — IPRATROPIUM-ALBUTEROL 0.5-2.5 (3) MG/3ML IN SOLN
3.0000 mL | Freq: Three times a day (TID) | RESPIRATORY_TRACT | Status: DC
  Administered 2016-06-10 – 2016-06-11 (×3): 3 mL via RESPIRATORY_TRACT
  Filled 2016-06-09 (×4): qty 3

## 2016-06-09 MED ORDER — POLYETHYLENE GLYCOL 3350 17 G PO PACK
17.0000 g | PACK | Freq: Every day | ORAL | Status: DC
  Administered 2016-06-09 – 2016-06-11 (×3): 17 g via ORAL
  Filled 2016-06-09 (×3): qty 1

## 2016-06-09 NOTE — Progress Notes (Signed)
. Subjective: Her breathing is better. The cough and wheezing is better. No fver oor chills. She is complaining of constipation.  Objective: Vital signs in last 24 hours: Temp:  [97.5 F (36.4 C)-98.3 F (36.8 C)] 98.3 F (36.8 C) (01/13 2123) Pulse Rate:  [86-95] 86 (01/13 2123) Resp:  [18] 18 (01/13 2123) BP: (130-141)/(58-70) 141/58 (01/13 2123) SpO2:  [96 %-98 %] 96 % (01/14 0219) FiO2 (%):  [96 %] 96 % (01/14 0741) Weight change:  Last BM Date: 06/07/16  Intake/Output from previous day: 01/13 0701 - 01/14 0700 In: 480 [P.O.:480] Out: -   PHYSICAL EXAM General appearance: alert and no distress Resp: diminished breath sounds bilaterally and wheezes bilaterally Cardio: S1, S2 normal GI: soft, non-tender; bowel sounds normal; no masses,  no organomegaly Extremities: extremities normal, atraumatic, no cyanosis or edema  Lab Results:  Results for orders placed or performed during the hospital encounter of 06/07/16 (from the past 48 hour(s))  Lactic acid, plasma     Status: None   Collection Time: 06/07/16 12:54 PM  Result Value Ref Range   Lactic Acid, Venous 1.2 0.5 - 1.9 mmol/L  Troponin I     Status: None   Collection Time: 06/07/16 12:54 PM  Result Value Ref Range   Troponin I <0.03 <0.03 ng/mL  Brain natriuretic peptide     Status: None   Collection Time: 06/07/16 12:54 PM  Result Value Ref Range   B Natriuretic Peptide 17.0 0.0 - 100.0 pg/mL  Basic metabolic panel     Status: Abnormal   Collection Time: 06/07/16 12:54 PM  Result Value Ref Range   Sodium 139 135 - 145 mmol/L   Potassium 4.1 3.5 - 5.1 mmol/L   Chloride 102 101 - 111 mmol/L   CO2 27 22 - 32 mmol/L   Glucose, Bld 98 65 - 99 mg/dL   BUN 26 (H) 6 - 20 mg/dL   Creatinine, Ser 1.42 (H) 0.44 - 1.00 mg/dL   Calcium 9.6 8.9 - 10.3 mg/dL   GFR calc non Af Amer 36 (L) >60 mL/min   GFR calc Af Amer 42 (L) >60 mL/min    Comment: (NOTE) The eGFR has been calculated using the CKD EPI equation. This  calculation has not been validated in all clinical situations. eGFR's persistently <60 mL/min signify possible Chronic Kidney Disease.    Anion gap 10 5 - 15  CBC with Differential     Status: Abnormal   Collection Time: 06/07/16 12:54 PM  Result Value Ref Range   WBC 8.7 4.0 - 10.5 K/uL   RBC 5.33 (H) 3.87 - 5.11 MIL/uL   Hemoglobin 14.5 12.0 - 15.0 g/dL   HCT 44.3 36.0 - 46.0 %   MCV 83.1 78.0 - 100.0 fL   MCH 27.2 26.0 - 34.0 pg   MCHC 32.7 30.0 - 36.0 g/dL   RDW 15.2 11.5 - 15.5 %   Platelets 267 150 - 400 K/uL   Neutrophils Relative % 62 %   Neutro Abs 5.4 1.7 - 7.7 K/uL   Lymphocytes Relative 24 %   Lymphs Abs 2.1 0.7 - 4.0 K/uL   Monocytes Relative 9 %   Monocytes Absolute 0.8 0.1 - 1.0 K/uL   Eosinophils Relative 4 %   Eosinophils Absolute 0.4 0.0 - 0.7 K/uL   Basophils Relative 1 %   Basophils Absolute 0.1 0.0 - 0.1 K/uL  D-dimer, quantitative     Status: Abnormal   Collection Time: 06/07/16 12:54 PM  Result  Value Ref Range   D-Dimer, Quant 0.95 (H) 0.00 - 0.50 ug/mL-FEU    Comment: (NOTE) At the manufacturer cut-off of 0.50 ug/mL FEU, this assay has been documented to exclude PE with a sensitivity and negative predictive value of 97 to 99%.  At this time, this assay has not been approved by the FDA to exclude DVT/VTE. Results should be correlated with clinical presentation.   Lactic acid, plasma     Status: Abnormal   Collection Time: 06/07/16  3:34 PM  Result Value Ref Range   Lactic Acid, Venous 3.7 (HH) 0.5 - 1.9 mmol/L    Comment: CRITICAL RESULT CALLED TO, READ BACK BY AND VERIFIED WITH: CRUISE,JJ ON 06/07/16 AT 1625 BY LOY,C   Influenza panel by PCR (type A & B, H1N1)     Status: None   Collection Time: 06/07/16  4:44 PM  Result Value Ref Range   Influenza A By PCR NEGATIVE NEGATIVE   Influenza B By PCR NEGATIVE NEGATIVE    Comment: (NOTE) The Xpert Xpress Flu assay is intended as an aid in the diagnosis of  influenza and should not be used as a sole  basis for treatment.  This  assay is FDA approved for nasopharyngeal swab specimens only. Nasal  washings and aspirates are unacceptable for Xpert Xpress Flu testing.   Urinalysis, Routine w reflex microscopic     Status: Abnormal   Collection Time: 06/07/16  4:44 PM  Result Value Ref Range   Color, Urine YELLOW YELLOW   APPearance HAZY (A) CLEAR   Specific Gravity, Urine >1.030 (H) 1.005 - 1.030   pH 5.5 5.0 - 8.0   Glucose, UA 100 (A) NEGATIVE mg/dL   Hgb urine dipstick NEGATIVE NEGATIVE   Bilirubin Urine SMALL (A) NEGATIVE   Ketones, ur TRACE (A) NEGATIVE mg/dL   Protein, ur NEGATIVE NEGATIVE mg/dL   Nitrite NEGATIVE NEGATIVE   Leukocytes, UA NEGATIVE NEGATIVE    Comment: Microscopic not done on urines with negative protein, blood, leukocytes, nitrite, or glucose < 500 mg/dL.  Urine culture     Status: Abnormal   Collection Time: 06/07/16  4:44 PM  Result Value Ref Range   Specimen Description URINE, CLEAN CATCH    Special Requests NONE    Culture MULTIPLE SPECIES PRESENT, SUGGEST RECOLLECTION (A)    Report Status 06/09/2016 FINAL   Blood Culture (routine x 2)     Status: None (Preliminary result)   Collection Time: 06/07/16  6:10 PM  Result Value Ref Range   Specimen Description BLOOD RIGHT FOREARM    Special Requests BOTTLES DRAWN AEROBIC AND ANAEROBIC 4CC EACH    Culture NO GROWTH 2 DAYS    Report Status PENDING   Hepatic function panel     Status: Abnormal   Collection Time: 06/07/16  6:10 PM  Result Value Ref Range   Total Protein 5.9 (L) 6.5 - 8.1 g/dL   Albumin 3.6 3.5 - 5.0 g/dL   AST 20 15 - 41 U/L   ALT 14 14 - 54 U/L   Alkaline Phosphatase 57 38 - 126 U/L   Total Bilirubin 0.3 0.3 - 1.2 mg/dL   Bilirubin, Direct 0.1 0.1 - 0.5 mg/dL   Indirect Bilirubin 0.2 (L) 0.3 - 0.9 mg/dL  Blood Culture (routine x 2)     Status: None (Preliminary result)   Collection Time: 06/07/16  6:15 PM  Result Value Ref Range   Specimen Description BLOOD RIGHT HAND    Special  Requests BOTTLES DRAWN  AEROBIC AND ANAEROBIC 4CC EACH    Culture NO GROWTH 2 DAYS    Report Status PENDING   Lactic acid, plasma     Status: Abnormal   Collection Time: 06/07/16  9:53 PM  Result Value Ref Range   Lactic Acid, Venous 2.6 (HH) 0.5 - 1.9 mmol/L    Comment: CRITICAL RESULT CALLED TO, READ BACK BY AND VERIFIED WITH:  RUSSELL,L @ 2339 ON 06/07/16 BY JUW   Lactic acid, plasma     Status: Abnormal   Collection Time: 06/08/16  2:18 AM  Result Value Ref Range   Lactic Acid, Venous 3.9 (HH) 0.5 - 1.9 mmol/L    Comment: CRITICAL RESULT CALLED TO, READ BACK BY AND VERIFIED WITH:  RUSSELL,L @ 2979 ON 06/08/16 BY JUW   CBC     Status: None   Collection Time: 06/08/16  7:03 AM  Result Value Ref Range   WBC 8.2 4.0 - 10.5 K/uL   RBC 4.98 3.87 - 5.11 MIL/uL   Hemoglobin 13.4 12.0 - 15.0 g/dL   HCT 41.7 36.0 - 46.0 %   MCV 83.7 78.0 - 100.0 fL   MCH 26.9 26.0 - 34.0 pg   MCHC 32.1 30.0 - 36.0 g/dL   RDW 15.3 11.5 - 15.5 %   Platelets 276 150 - 400 K/uL  Comprehensive metabolic panel     Status: Abnormal   Collection Time: 06/08/16  7:03 AM  Result Value Ref Range   Sodium 139 135 - 145 mmol/L   Potassium 4.0 3.5 - 5.1 mmol/L   Chloride 108 101 - 111 mmol/L   CO2 21 (L) 22 - 32 mmol/L   Glucose, Bld 199 (H) 65 - 99 mg/dL   BUN 18 6 - 20 mg/dL   Creatinine, Ser 0.97 0.44 - 1.00 mg/dL   Calcium 8.9 8.9 - 10.3 mg/dL   Total Protein 7.2 6.5 - 8.1 g/dL   Albumin 4.0 3.5 - 5.0 g/dL   AST 29 15 - 41 U/L   ALT 17 14 - 54 U/L   Alkaline Phosphatase 62 38 - 126 U/L   Total Bilirubin 0.3 0.3 - 1.2 mg/dL   GFR calc non Af Amer 57 (L) >60 mL/min   GFR calc Af Amer >60 >60 mL/min    Comment: (NOTE) The eGFR has been calculated using the CKD EPI equation. This calculation has not been validated in all clinical situations. eGFR's persistently <60 mL/min signify possible Chronic Kidney Disease.    Anion gap 10 5 - 15    ABGS No results for input(s): PHART, PO2ART, TCO2, HCO3 in  the last 72 hours.  Invalid input(s): PCO2 CULTURES Recent Results (from the past 240 hour(s))  Urine culture     Status: Abnormal   Collection Time: 06/07/16  4:44 PM  Result Value Ref Range Status   Specimen Description URINE, CLEAN CATCH  Final   Special Requests NONE  Final   Culture MULTIPLE SPECIES PRESENT, SUGGEST RECOLLECTION (A)  Final   Report Status 06/09/2016 FINAL  Final  Blood Culture (routine x 2)     Status: None (Preliminary result)   Collection Time: 06/07/16  6:10 PM  Result Value Ref Range Status   Specimen Description BLOOD RIGHT FOREARM  Final   Special Requests BOTTLES DRAWN AEROBIC AND ANAEROBIC 4CC EACH  Final   Culture NO GROWTH 2 DAYS  Final   Report Status PENDING  Incomplete  Blood Culture (routine x 2)     Status: None (Preliminary result)  Collection Time: 06/07/16  6:15 PM  Result Value Ref Range Status   Specimen Description BLOOD RIGHT HAND  Final   Special Requests BOTTLES DRAWN AEROBIC AND ANAEROBIC 4CC EACH  Final   Culture NO GROWTH 2 DAYS  Final   Report Status PENDING  Incomplete   Studies/Results: Nm Pulmonary Perf And Vent  Result Date: 06/07/2016 CLINICAL DATA:  Cough congestion and slight chest pain for several days. EXAM: NUCLEAR MEDICINE VENTILATION - PERFUSION LUNG SCAN TECHNIQUE: Ventilation images were obtained in multiple projections using inhaled aerosol Tc-67mDTPA. Perfusion images were obtained in multiple projections after intravenous injection of Tc-943mAA. RADIOPHARMACEUTICALS:  30.0 mCi Technetium-9943mPA aerosol inhalation and 4.0 mCi Technetium-72m71m IV COMPARISON:  Current chest radiograph FINDINGS: Ventilation: There is somewhat heterogeneous ventilatory uptake in the lungs without a discrete defect. Perfusion: No wedge shaped peripheral perfusion defects to suggest acute pulmonary embolism. IMPRESSION: No evidence of a pulmonary thromboembolism. Electronically Signed   By: DaviLajean Manes.   On: 06/07/2016 16:47     Medications: I have reviewed the patient's current medications.  Assesment:  Active Problems:   COPD (chronic obstructive pulmonary disease) (HCC)   Essential hypertension   Sepsis (HCC)[place]    Plan:  Medications reviewed Will D/C vanc, and continue Levaquin. Continue IV steroid and nebulizer Continue regular treatment.    LOS: 1 day   [facility] 06/09/2016, 12:06 PM

## 2016-06-10 MED ORDER — LEVOFLOXACIN 750 MG PO TABS
750.0000 mg | ORAL_TABLET | Freq: Every day | ORAL | Status: DC
  Administered 2016-06-10: 750 mg via ORAL
  Filled 2016-06-10: qty 1

## 2016-06-10 MED ORDER — HYDROCOD POLST-CPM POLST ER 10-8 MG/5ML PO SUER
5.0000 mL | Freq: Two times a day (BID) | ORAL | Status: DC
  Administered 2016-06-10 – 2016-06-11 (×3): 5 mL via ORAL
  Filled 2016-06-10 (×3): qty 5

## 2016-06-10 MED ORDER — METHYLPREDNISOLONE SODIUM SUCC 40 MG IJ SOLR
40.0000 mg | Freq: Two times a day (BID) | INTRAMUSCULAR | Status: DC
  Administered 2016-06-10 – 2016-06-11 (×2): 40 mg via INTRAVENOUS
  Filled 2016-06-10 (×2): qty 1

## 2016-06-10 NOTE — Progress Notes (Signed)
Pharmacy Antibiotic Note  [name] is a 71 y.o. female admitted on 06/07/2016 with sepsis.  Pharmacy has been consulted for Vancomycin, Aztreonam, and Levaquin dosing. Day#4 of Levaquin. Afebrile On IV steroids,weaning dose. Patient improving, only complaining of cough. Still some wheezing. Transition to equivalent oral dose form(s). Tolerating diet.  Plan: Levaquin 750mg  po every 24 hours F/U cx and clinical progress  Height: 5\' 3"  (160 cm) Weight: 190 lb (86.2 kg) IBW/kg (Calculated) : 52.4  Temp (24hrs), Avg:98.4 F (36.9 C), Min:98.2 F (36.8 C), Max:98.6 F (37 C)   Recent Labs Lab 06/07/16 1254 06/07/16 1534 06/07/16 2153 06/08/16 0218 06/08/16 0703  WBC 8.7  --   --   --  8.2  CREATININE 1.42*  --   --   --  0.97  LATICACIDVEN 1.2 3.7* 2.6* 3.9*  --     Estimated Creatinine Clearance: 54.5 mL/min (by C-G formula based on SCr of 0.97 mg/dL).    Allergies  Allergen Reactions  . Omnipaque [Iohexol] Other (See Comments)    blisters  . Penicillins Other (See Comments)    Unknown reaction    Antimicrobials this admission: Vancomycin 1/12 >> 1/12 Aztreonam 1/12 >> 1/12 Levaquin 1/12 >>  Dose adjustments this admission: n/a  Microbiology results: 1/12 BCx: ngtd 1/12 UCx: multiple species  Thank you for allowing pharmacy to be a part of this patient's care. Elder CyphersLorie Pattijo Juste, BS Pharm D, [place] Clinical Pharmacist Pager [phone] 06/10/2016 11:29 AM

## 2016-06-10 NOTE — Progress Notes (Signed)
.   Subjective: Still wheezing some. She is complaining of dry recurrent cough. She could not rest due to the cough. No fever or chills. Objective: Vital signs in last 24 hours: Temp:  [98.2 F (36.8 C)-98.6 F (37 C)] 98.6 F (37 C) (01/15 0609) Pulse Rate:  [68-77] 68 (01/15 0609) Resp:  [18] 18 (01/15 0609) BP: (129-140)/(64-74) 129/74 (01/15 0609) SpO2:  [94 %-97 %] 94 % (01/15 0609) Weight change:  Last BM Date: 06/07/16  Intake/Output from previous day: 01/14 0701 - 01/15 0700 In: 540 [P.O.:240; IV Piggyback:300] Out: -   PHYSICAL EXAM General appearance: alert and no distress Resp: diminished breath sounds bilaterally and wheezes bilaterally Cardio: S1, S2 normal GI: soft, non-tender; bowel sounds normal; no masses,  no organomegaly Extremities: extremities normal, atraumatic, no cyanosis or edema  Lab Results:  No results found for this or any previous visit (from the past 48 hour(s)).  ABGS No results for input(s): PHART, PO2ART, TCO2, HCO3 in the last 72 hours.  Invalid input(s): PCO2 CULTURES Recent Results (from the past 240 hour(s))  Urine culture     Status: Abnormal   Collection Time: 06/07/16  4:44 PM  Result Value Ref Range Status   Specimen Description URINE, CLEAN CATCH  Final   Special Requests NONE  Final   Culture MULTIPLE SPECIES PRESENT, SUGGEST RECOLLECTION (A)  Final   Report Status 06/09/2016 FINAL  Final  Blood Culture (routine x 2)     Status: None (Preliminary result)   Collection Time: 06/07/16  6:10 PM  Result Value Ref Range Status   Specimen Description BLOOD RIGHT FOREARM  Final   Special Requests BOTTLES DRAWN AEROBIC AND ANAEROBIC 4CC EACH  Final   Culture NO GROWTH 3 DAYS  Final   Report Status PENDING  Incomplete  Blood Culture (routine x 2)     Status: None (Preliminary result)   Collection Time: 06/07/16  6:15 PM  Result Value Ref Range Status   Specimen Description BLOOD RIGHT HAND  Final   Special Requests BOTTLES DRAWN  AEROBIC AND ANAEROBIC 4CC EACH  Final   Culture NO GROWTH 3 DAYS  Final   Report Status PENDING  Incomplete   Studies/Results: No results found.  Medications: I have reviewed the patient's current medications.  Assesment:  Active Problems:   COPD (chronic obstructive pulmonary disease) (HCC)   Essential hypertension   Sepsis (HCC)   Acute bronchitis    Plan:  Medications reviewed will adjust iv steroid Continue neb and antibiotics tussin cough syrup 5 ml po BID    LOS: 2 days   [name] 06/10/2016, 8:26 AM

## 2016-06-11 MED ORDER — LEVOFLOXACIN 750 MG PO TABS: 750.0000 mg | ORAL_TABLET | Freq: Every day | ORAL | 0 refills | Status: DC

## 2016-06-11 MED ORDER — ALBUTEROL SULFATE HFA 108 (90 BASE) MCG/ACT IN AERS: 2.0000 | INHALATION_SPRAY | Freq: Four times a day (QID) | RESPIRATORY_TRACT | 2 refills | Status: DC | PRN

## 2016-06-11 MED ORDER — PREDNISONE 10 MG (21) PO TBPK: ORAL_TABLET | ORAL | 0 refills | Status: DC

## 2016-06-11 NOTE — Discharge Summary (Signed)
Physician Discharge Summary  Patient ID: [name] MRN: [id number] DOB/AGE: 73/11/1945 73 y.o. Primary Care Physician:Haeley Fordham, MD Admit date: 06/07/2016 Discharge date: 06/11/2016    Discharge Diagnoses:   Active Problems:   COPD (chronic obstructive pulmonary disease) (HCC)   Essential hypertension   Sepsis (HCC)   Acute bronchitis   Allergies as of 06/11/2016      Reactions   Omnipaque [iohexol] Other (See Comments)   blisters   Penicillins Other (See Comments)   Unknown reaction      Medication List    STOP taking these medications   azithromycin 250 MG tablet Commonly known as:  ZITHROMAX     TAKE these medications   albuterol 108 (90 Base) MCG/ACT inhaler Commonly known as:  PROVENTIL HFA;VENTOLIN HFA Inhale 2 puffs into the lungs every 6 (six) hours as needed for wheezing or shortness of breath.   atorvastatin 40 MG tablet Commonly known as:  LIPITOR Take 1 tablet by mouth every evening.   cholecalciferol 1000 units tablet Commonly known as:  VITAMIN D Take 1,000 Units by mouth daily.   dextromethorphan-guaiFENesin 30-600 MG 12hr tablet Commonly known as:  MUCINEX DM Take 2 tablets by mouth 2 (two) times daily as needed for cough (congestion).   levofloxacin 750 MG tablet Commonly known as:  LEVAQUIN Take 1 tablet (750 mg total) by mouth at bedtime.   lisinopril 10 MG tablet Commonly known as:  PRINIVIL,ZESTRIL Take 10 mg by mouth daily.   metoprolol succinate 25 MG 24 hr tablet Commonly known as:  TOPROL-XL Take 12.5 mg by mouth 2 (two) times daily.   pantoprazole 40 MG tablet Commonly known as:  PROTONIX Take 1 tablet (40 mg total) by mouth 2 (two) times daily. What changed:  when to take this   predniSONE 10 MG (21) Tbpk tablet Commonly known as:  STERAPRED UNI-PAK 21 TAB 40 mg po daily for 3 days, 30 mg po daily for 3 days, 20 mg po daily foor 3 days, 10 mg po daily for 3 days.   traMADol-acetaminophen 37.5-325 MG tablet Commonly  known as:  ULTRACET Take 1 tablet by mouth 2 (two) times daily as needed for moderate pain.   vitamin C 1000 MG tablet Take 2,000 mg by mouth daily.       Discharged Condition: improved    Consults: none  Significant Diagnostic Studies: Dg Chest 2 View  Result Date: 06/07/2016 CLINICAL DATA:  Shortness of breath, congestion, cough for 2 weeks EXAM: CHEST  2 VIEW COMPARISON:  03/14/2014 FINDINGS: Prior CABG. Heart and mediastinal contours are within normal limits. No focal opacities or effusions. No acute bony abnormality. Small hiatal hernia. IMPRESSION: No active cardiopulmonary disease. Electronically Signed   By: Charlett NoseKevin  Dover M.D.   On: 06/07/2016 11:51   Nm Pulmonary Perf And Vent  Result Date: 06/07/2016 CLINICAL DATA:  Cough congestion and slight chest pain for several days. EXAM: NUCLEAR MEDICINE VENTILATION - PERFUSION LUNG SCAN TECHNIQUE: Ventilation images were obtained in multiple projections using inhaled aerosol Tc-8372m DTPA. Perfusion images were obtained in multiple projections after intravenous injection of Tc-1172m MAA. RADIOPHARMACEUTICALS:  30.0 mCi Technetium-11072m DTPA aerosol inhalation and 4.0 mCi Technetium-2172m MAA IV COMPARISON:  Current chest radiograph FINDINGS: Ventilation: There is somewhat heterogeneous ventilatory uptake in the lungs without a discrete defect. Perfusion: No wedge shaped peripheral perfusion defects to suggest acute pulmonary embolism. IMPRESSION: No evidence of a pulmonary thromboembolism. Electronically Signed   By: Amie Portlandavid  Ormond M.D.   On: 06/07/2016 16:47  Lab Results: Basic Metabolic Panel: No results for input(s): NA, K, CL, CO2, GLUCOSE, BUN, CREATININE, CALCIUM, MG, PHOS in the last 72 hours. Liver Function Tests: No results for input(s): AST, ALT, ALKPHOS, BILITOT, PROT, ALBUMIN in the last 72 hours.   CBC: No results for input(s): WBC, NEUTROABS, HGB, HCT, MCV, PLT in the last 72 hours.  Recent Results (from the past 240  hour(s))  Urine culture     Status: Abnormal   Collection Time: 06/07/16  4:44 PM  Result Value Ref Range Status   Specimen Description URINE, CLEAN CATCH  Final   Special Requests NONE  Final   Culture MULTIPLE SPECIES PRESENT, SUGGEST RECOLLECTION (A)  Final   Report Status 06/09/2016 FINAL  Final  Blood Culture (routine x 2)     Status: None (Preliminary result)   Collection Time: 06/07/16  6:10 PM  Result Value Ref Range Status   Specimen Description BLOOD RIGHT FOREARM  Final   Special Requests BOTTLES DRAWN AEROBIC AND ANAEROBIC 4CC EACH  Final   Culture NO GROWTH 3 DAYS  Final   Report Status PENDING  Incomplete  Blood Culture (routine x 2)     Status: None (Preliminary result)   Collection Time: 06/07/16  6:15 PM  Result Value Ref Range Status   Specimen Description BLOOD RIGHT HAND  Final   Special Requests BOTTLES DRAWN AEROBIC AND ANAEROBIC 4CC EACH  Final   Culture NO GROWTH 3 DAYS  Final   Report Status PENDING  Incomplete     Hospital Course:   This is a 71 years old female with history of multiple medical illnesses was admitted due to cough, wheezing and shortness of breath. Patient was admitted and treated as case of COPD exacerbation. Since she had elevated lactate level patient was initially treated for possible sepsis, however, her cultures were negative and there was no sign infection. Patient improved and discharged on oral antibiotic and oral steroid.  Discharge Exam: Blood pressure (!) 175/70, pulse 75, temperature 97 F (36.1 C), temperature source Oral, resp. rate 14, height 5\' 3"  (1.6 m), weight 86.2 kg (190 lb), SpO2 100 %.   Disposition:  home    Follow-up Information    Reilyn Nelson, MD Follow up in 1 week(s).   Specialty:  Internal Medicine Contact information: [address] [place] [address] [phone]           Signed: Avon Gully   06/11/2016, 8:25 AM

## 2016-06-11 NOTE — Progress Notes (Signed)
Patient being d/c home with instructions. IV cath removed and intact. No c/o pain at site or at this time. Patient wants to know if Pharmacy can fill her medications here while she waits. Will follow up with Pharmacy.

## 2016-06-12 LAB — CULTURE, BLOOD (ROUTINE X 2)
Culture: NO GROWTH
Culture: NO GROWTH

## 2016-06-21 ENCOUNTER — Other Ambulatory Visit (HOSPITAL_COMMUNITY): Payer: Self-pay | Admitting: Internal Medicine

## 2016-06-21 ENCOUNTER — Ambulatory Visit (HOSPITAL_COMMUNITY)
Admission: RE | Admit: 2016-06-21 | Discharge: 2016-06-21 | Disposition: A | Discharge status: Home / Self Care | Payer: Medicare Other | Source: Ambulatory Visit | Attending: Internal Medicine | Admitting: Internal Medicine

## 2016-06-21 DIAGNOSIS — R0989 Other specified symptoms and signs involving the circulatory and respiratory systems: Secondary | ICD-10-CM | POA: Insufficient documentation

## 2016-06-21 DIAGNOSIS — R05 Cough: Secondary | ICD-10-CM | POA: Diagnosis not present

## 2016-06-21 DIAGNOSIS — J441 Chronic obstructive pulmonary disease with (acute) exacerbation: Secondary | ICD-10-CM | POA: Diagnosis not present

## 2016-06-21 DIAGNOSIS — I10 Essential (primary) hypertension: Secondary | ICD-10-CM | POA: Diagnosis not present

## 2016-07-18 DIAGNOSIS — E785 Hyperlipidemia, unspecified: Secondary | ICD-10-CM | POA: Diagnosis not present

## 2016-07-18 DIAGNOSIS — J410 Simple chronic bronchitis: Secondary | ICD-10-CM | POA: Diagnosis not present

## 2016-07-18 DIAGNOSIS — I2510 Atherosclerotic heart disease of native coronary artery without angina pectoris: Secondary | ICD-10-CM | POA: Diagnosis not present

## 2016-07-18 DIAGNOSIS — I10 Essential (primary) hypertension: Secondary | ICD-10-CM | POA: Diagnosis not present

## 2016-10-14 DIAGNOSIS — I10 Essential (primary) hypertension: Secondary | ICD-10-CM | POA: Diagnosis not present

## 2016-10-14 DIAGNOSIS — I2510 Atherosclerotic heart disease of native coronary artery without angina pectoris: Secondary | ICD-10-CM | POA: Diagnosis not present

## 2016-10-14 DIAGNOSIS — K219 Gastro-esophageal reflux disease without esophagitis: Secondary | ICD-10-CM | POA: Diagnosis not present

## 2017-01-13 DIAGNOSIS — I2510 Atherosclerotic heart disease of native coronary artery without angina pectoris: Secondary | ICD-10-CM | POA: Diagnosis not present

## 2017-01-13 DIAGNOSIS — E785 Hyperlipidemia, unspecified: Secondary | ICD-10-CM | POA: Diagnosis not present

## 2017-01-13 DIAGNOSIS — K219 Gastro-esophageal reflux disease without esophagitis: Secondary | ICD-10-CM | POA: Diagnosis not present

## 2017-04-22 DIAGNOSIS — I2510 Atherosclerotic heart disease of native coronary artery without angina pectoris: Secondary | ICD-10-CM | POA: Diagnosis not present

## 2017-04-22 DIAGNOSIS — K219 Gastro-esophageal reflux disease without esophagitis: Secondary | ICD-10-CM | POA: Diagnosis not present

## 2017-04-22 DIAGNOSIS — I10 Essential (primary) hypertension: Secondary | ICD-10-CM | POA: Diagnosis not present

## 2017-09-25 DIAGNOSIS — Z1331 Encounter for screening for depression: Secondary | ICD-10-CM | POA: Diagnosis not present

## 2017-09-25 DIAGNOSIS — K219 Gastro-esophageal reflux disease without esophagitis: Secondary | ICD-10-CM | POA: Diagnosis not present

## 2017-09-25 DIAGNOSIS — Z1389 Encounter for screening for other disorder: Secondary | ICD-10-CM | POA: Diagnosis not present

## 2017-09-25 DIAGNOSIS — I10 Essential (primary) hypertension: Secondary | ICD-10-CM | POA: Diagnosis not present

## 2017-09-25 DIAGNOSIS — I2510 Atherosclerotic heart disease of native coronary artery without angina pectoris: Secondary | ICD-10-CM | POA: Diagnosis not present

## 2017-12-29 ENCOUNTER — Other Ambulatory Visit (HOSPITAL_COMMUNITY): Payer: Self-pay | Admitting: Internal Medicine

## 2017-12-29 DIAGNOSIS — Z1231 Encounter for screening mammogram for malignant neoplasm of breast: Secondary | ICD-10-CM

## 2018-01-01 ENCOUNTER — Ambulatory Visit (HOSPITAL_COMMUNITY)
Admission: RE | Admit: 2018-01-01 | Discharge: 2018-01-01 | Disposition: A | Discharge status: Home / Self Care | Payer: Medicare PPO | Source: Ambulatory Visit | Attending: Internal Medicine | Admitting: Internal Medicine

## 2018-01-01 ENCOUNTER — Encounter (HOSPITAL_COMMUNITY): Payer: Self-pay

## 2018-01-01 DIAGNOSIS — Z1231 Encounter for screening mammogram for malignant neoplasm of breast: Secondary | ICD-10-CM | POA: Diagnosis not present

## 2018-01-29 DIAGNOSIS — I2510 Atherosclerotic heart disease of native coronary artery without angina pectoris: Secondary | ICD-10-CM | POA: Diagnosis not present

## 2018-01-29 DIAGNOSIS — Z0000 Encounter for general adult medical examination without abnormal findings: Secondary | ICD-10-CM | POA: Diagnosis not present

## 2018-01-29 DIAGNOSIS — I10 Essential (primary) hypertension: Secondary | ICD-10-CM | POA: Diagnosis not present

## 2018-01-29 DIAGNOSIS — R739 Hyperglycemia, unspecified: Secondary | ICD-10-CM | POA: Diagnosis not present

## 2018-04-30 DIAGNOSIS — K219 Gastro-esophageal reflux disease without esophagitis: Secondary | ICD-10-CM | POA: Diagnosis not present

## 2018-04-30 DIAGNOSIS — I10 Essential (primary) hypertension: Secondary | ICD-10-CM | POA: Diagnosis not present

## 2018-04-30 DIAGNOSIS — I2510 Atherosclerotic heart disease of native coronary artery without angina pectoris: Secondary | ICD-10-CM | POA: Diagnosis not present

## 2018-07-30 DIAGNOSIS — I10 Essential (primary) hypertension: Secondary | ICD-10-CM | POA: Diagnosis not present

## 2018-07-30 DIAGNOSIS — I2510 Atherosclerotic heart disease of native coronary artery without angina pectoris: Secondary | ICD-10-CM | POA: Diagnosis not present

## 2018-07-30 DIAGNOSIS — E785 Hyperlipidemia, unspecified: Secondary | ICD-10-CM | POA: Diagnosis not present

## 2018-07-30 DIAGNOSIS — K219 Gastro-esophageal reflux disease without esophagitis: Secondary | ICD-10-CM | POA: Diagnosis not present

## 2018-11-03 DIAGNOSIS — K219 Gastro-esophageal reflux disease without esophagitis: Secondary | ICD-10-CM | POA: Diagnosis not present

## 2018-11-03 DIAGNOSIS — I10 Essential (primary) hypertension: Secondary | ICD-10-CM | POA: Diagnosis not present

## 2018-11-03 DIAGNOSIS — Z0000 Encounter for general adult medical examination without abnormal findings: Secondary | ICD-10-CM | POA: Diagnosis not present

## 2018-11-03 DIAGNOSIS — Z1389 Encounter for screening for other disorder: Secondary | ICD-10-CM | POA: Diagnosis not present

## 2018-11-03 DIAGNOSIS — Z0001 Encounter for general adult medical examination with abnormal findings: Secondary | ICD-10-CM | POA: Diagnosis not present

## 2018-11-03 DIAGNOSIS — Z1331 Encounter for screening for depression: Secondary | ICD-10-CM | POA: Diagnosis not present

## 2018-12-03 DIAGNOSIS — I2510 Atherosclerotic heart disease of native coronary artery without angina pectoris: Secondary | ICD-10-CM | POA: Diagnosis not present

## 2018-12-03 DIAGNOSIS — I10 Essential (primary) hypertension: Secondary | ICD-10-CM | POA: Diagnosis not present

## 2019-01-03 DIAGNOSIS — I2510 Atherosclerotic heart disease of native coronary artery without angina pectoris: Secondary | ICD-10-CM | POA: Diagnosis not present

## 2019-01-03 DIAGNOSIS — I10 Essential (primary) hypertension: Secondary | ICD-10-CM | POA: Diagnosis not present

## 2019-01-20 ENCOUNTER — Other Ambulatory Visit (HOSPITAL_COMMUNITY): Payer: Self-pay | Admitting: Internal Medicine

## 2019-01-20 DIAGNOSIS — Z1231 Encounter for screening mammogram for malignant neoplasm of breast: Secondary | ICD-10-CM

## 2019-02-03 DIAGNOSIS — I10 Essential (primary) hypertension: Secondary | ICD-10-CM | POA: Diagnosis not present

## 2019-02-03 DIAGNOSIS — I2510 Atherosclerotic heart disease of native coronary artery without angina pectoris: Secondary | ICD-10-CM | POA: Diagnosis not present

## 2019-02-04 ENCOUNTER — Ambulatory Visit (HOSPITAL_COMMUNITY): Payer: Medicare PPO

## 2019-02-08 ENCOUNTER — Other Ambulatory Visit: Payer: Self-pay

## 2019-02-08 ENCOUNTER — Ambulatory Visit (HOSPITAL_COMMUNITY)
Admission: RE | Admit: 2019-02-08 | Discharge: 2019-02-08 | Disposition: A | Discharge status: Home / Self Care | Payer: Medicare PPO | Source: Ambulatory Visit | Attending: Internal Medicine | Admitting: Internal Medicine

## 2019-02-08 DIAGNOSIS — Z1231 Encounter for screening mammogram for malignant neoplasm of breast: Secondary | ICD-10-CM | POA: Diagnosis not present

## 2019-03-02 DIAGNOSIS — I2510 Atherosclerotic heart disease of native coronary artery without angina pectoris: Secondary | ICD-10-CM | POA: Diagnosis not present

## 2019-03-02 DIAGNOSIS — I10 Essential (primary) hypertension: Secondary | ICD-10-CM | POA: Diagnosis not present

## 2019-04-02 DIAGNOSIS — I2510 Atherosclerotic heart disease of native coronary artery without angina pectoris: Secondary | ICD-10-CM | POA: Diagnosis not present

## 2019-04-02 DIAGNOSIS — K219 Gastro-esophageal reflux disease without esophagitis: Secondary | ICD-10-CM | POA: Diagnosis not present

## 2019-04-05 DIAGNOSIS — I2510 Atherosclerotic heart disease of native coronary artery without angina pectoris: Secondary | ICD-10-CM | POA: Diagnosis not present

## 2019-04-05 DIAGNOSIS — I10 Essential (primary) hypertension: Secondary | ICD-10-CM | POA: Diagnosis not present

## 2019-04-05 DIAGNOSIS — E785 Hyperlipidemia, unspecified: Secondary | ICD-10-CM | POA: Diagnosis not present

## 2019-05-05 DIAGNOSIS — K219 Gastro-esophageal reflux disease without esophagitis: Secondary | ICD-10-CM | POA: Diagnosis not present

## 2019-05-05 DIAGNOSIS — I2510 Atherosclerotic heart disease of native coronary artery without angina pectoris: Secondary | ICD-10-CM | POA: Diagnosis not present

## 2019-06-05 DIAGNOSIS — I2510 Atherosclerotic heart disease of native coronary artery without angina pectoris: Secondary | ICD-10-CM | POA: Diagnosis not present

## 2019-06-05 DIAGNOSIS — K219 Gastro-esophageal reflux disease without esophagitis: Secondary | ICD-10-CM | POA: Diagnosis not present

## 2019-07-06 DIAGNOSIS — I10 Essential (primary) hypertension: Secondary | ICD-10-CM | POA: Diagnosis not present

## 2019-07-06 DIAGNOSIS — I2510 Atherosclerotic heart disease of native coronary artery without angina pectoris: Secondary | ICD-10-CM | POA: Diagnosis not present

## 2019-08-03 DIAGNOSIS — K219 Gastro-esophageal reflux disease without esophagitis: Secondary | ICD-10-CM | POA: Diagnosis not present

## 2019-08-03 DIAGNOSIS — I2510 Atherosclerotic heart disease of native coronary artery without angina pectoris: Secondary | ICD-10-CM | POA: Diagnosis not present

## 2019-09-03 DIAGNOSIS — E785 Hyperlipidemia, unspecified: Secondary | ICD-10-CM | POA: Diagnosis not present

## 2019-09-03 DIAGNOSIS — K219 Gastro-esophageal reflux disease without esophagitis: Secondary | ICD-10-CM | POA: Diagnosis not present

## 2019-10-03 DIAGNOSIS — K219 Gastro-esophageal reflux disease without esophagitis: Secondary | ICD-10-CM | POA: Diagnosis not present

## 2019-10-03 DIAGNOSIS — I2510 Atherosclerotic heart disease of native coronary artery without angina pectoris: Secondary | ICD-10-CM | POA: Diagnosis not present

## 2019-11-18 ENCOUNTER — Other Ambulatory Visit: Payer: Self-pay

## 2019-11-18 ENCOUNTER — Other Ambulatory Visit (HOSPITAL_COMMUNITY)
Admission: RE | Admit: 2019-11-18 | Discharge: 2019-11-18 | Disposition: A | Discharge status: Home / Self Care | Payer: Medicare PPO | Source: Ambulatory Visit | Attending: Internal Medicine | Admitting: Internal Medicine

## 2019-11-18 DIAGNOSIS — Z0001 Encounter for general adult medical examination with abnormal findings: Secondary | ICD-10-CM | POA: Diagnosis not present

## 2019-11-18 DIAGNOSIS — I2510 Atherosclerotic heart disease of native coronary artery without angina pectoris: Secondary | ICD-10-CM | POA: Diagnosis not present

## 2019-11-18 DIAGNOSIS — I10 Essential (primary) hypertension: Secondary | ICD-10-CM | POA: Diagnosis not present

## 2019-11-18 DIAGNOSIS — Z1389 Encounter for screening for other disorder: Secondary | ICD-10-CM | POA: Diagnosis not present

## 2019-11-18 DIAGNOSIS — E785 Hyperlipidemia, unspecified: Secondary | ICD-10-CM | POA: Insufficient documentation

## 2019-11-18 DIAGNOSIS — Z1331 Encounter for screening for depression: Secondary | ICD-10-CM | POA: Diagnosis not present

## 2019-11-18 LAB — HEPATIC FUNCTION PANEL
ALT: 10 U/L (ref 0–44)
AST: 16 U/L (ref 15–41)
Albumin: 4.5 g/dL (ref 3.5–5.0)
Alkaline Phosphatase: 57 U/L (ref 38–126)
Bilirubin, Direct: 0.1 mg/dL (ref 0.0–0.2)
Indirect Bilirubin: 0.6 mg/dL (ref 0.3–0.9)
Total Bilirubin: 0.7 mg/dL (ref 0.3–1.2)
Total Protein: 7.6 g/dL (ref 6.5–8.1)

## 2019-11-18 LAB — LIPID PANEL
Cholesterol: 121 mg/dL (ref 0–200)
HDL: 37 mg/dL — ABNORMAL LOW (ref >40)
LDL Cholesterol: 56 mg/dL (ref 0–99)
Total CHOL/HDL Ratio: 3.3 RATIO
Triglycerides: 139 mg/dL (ref <150)
VLDL: 28 mg/dL (ref 0–40)

## 2019-11-18 LAB — CBC WITH DIFFERENTIAL/PLATELET
Abs Immature Granulocytes: 0.02 10*3/uL (ref 0.00–0.07)
Basophils Absolute: 0.1 10*3/uL (ref 0.0–0.1)
Basophils Relative: 1 %
Eosinophils Absolute: 0.3 10*3/uL (ref 0.0–0.5)
Eosinophils Relative: 3 %
HCT: 31.6 % — ABNORMAL LOW (ref 36.0–46.0)
Hemoglobin: 7.9 g/dL — ABNORMAL LOW (ref 12.0–15.0)
Immature Granulocytes: 0 %
Lymphocytes Relative: 17 %
Lymphs Abs: 1.4 10*3/uL (ref 0.7–4.0)
MCH: 15.6 pg — ABNORMAL LOW (ref 26.0–34.0)
MCHC: 25 g/dL — ABNORMAL LOW (ref 30.0–36.0)
MCV: 62.3 fL — ABNORMAL LOW (ref 80.0–100.0)
Monocytes Absolute: 0.6 10*3/uL (ref 0.1–1.0)
Monocytes Relative: 7 %
Neutro Abs: 6.2 10*3/uL (ref 1.7–7.7)
Neutrophils Relative %: 72 %
Platelets: 464 10*3/uL — ABNORMAL HIGH (ref 150–400)
RBC: 5.07 MIL/uL (ref 3.87–5.11)
RDW: 21.2 % — ABNORMAL HIGH (ref 11.5–15.5)
WBC: 8.5 10*3/uL (ref 4.0–10.5)
nRBC: 0 % (ref 0.0–0.2)

## 2019-11-18 LAB — BASIC METABOLIC PANEL
Anion gap: 11 (ref 5–15)
BUN: 24 mg/dL — ABNORMAL HIGH (ref 8–23)
CO2: 23 mmol/L (ref 22–32)
Calcium: 9.5 mg/dL (ref 8.9–10.3)
Chloride: 104 mmol/L (ref 98–111)
Creatinine, Ser: 1.29 mg/dL — ABNORMAL HIGH (ref 0.44–1.00)
GFR calc Af Amer: 47 mL/min — ABNORMAL LOW (ref >60)
GFR calc non Af Amer: 40 mL/min — ABNORMAL LOW (ref >60)
Glucose, Bld: 111 mg/dL — ABNORMAL HIGH (ref 70–99)
Potassium: 4 mmol/L (ref 3.5–5.1)
Sodium: 138 mmol/L (ref 135–145)

## 2019-12-21 DIAGNOSIS — K219 Gastro-esophageal reflux disease without esophagitis: Secondary | ICD-10-CM | POA: Diagnosis not present

## 2019-12-21 DIAGNOSIS — I2510 Atherosclerotic heart disease of native coronary artery without angina pectoris: Secondary | ICD-10-CM | POA: Diagnosis not present

## 2019-12-22 ENCOUNTER — Encounter: Payer: Self-pay | Admitting: Internal Medicine

## 2020-01-21 ENCOUNTER — Other Ambulatory Visit (HOSPITAL_COMMUNITY): Payer: Self-pay | Admitting: Internal Medicine

## 2020-01-21 DIAGNOSIS — Z1231 Encounter for screening mammogram for malignant neoplasm of breast: Secondary | ICD-10-CM

## 2020-01-21 DIAGNOSIS — I2510 Atherosclerotic heart disease of native coronary artery without angina pectoris: Secondary | ICD-10-CM | POA: Diagnosis not present

## 2020-01-21 DIAGNOSIS — I10 Essential (primary) hypertension: Secondary | ICD-10-CM | POA: Diagnosis not present

## 2020-02-17 ENCOUNTER — Ambulatory Visit (INDEPENDENT_AMBULATORY_CARE_PROVIDER_SITE_OTHER): Payer: Medicare PPO | Admitting: Nurse Practitioner

## 2020-02-17 ENCOUNTER — Other Ambulatory Visit: Payer: Self-pay

## 2020-02-17 ENCOUNTER — Ambulatory Visit (HOSPITAL_COMMUNITY)
Admission: RE | Admit: 2020-02-17 | Discharge: 2020-02-17 | Disposition: A | Discharge status: Home / Self Care | Payer: Medicare PPO | Source: Ambulatory Visit | Attending: Internal Medicine | Admitting: Internal Medicine

## 2020-02-17 ENCOUNTER — Encounter: Payer: Self-pay | Admitting: Nurse Practitioner

## 2020-02-17 DIAGNOSIS — D649 Anemia, unspecified: Secondary | ICD-10-CM

## 2020-02-17 DIAGNOSIS — Z8719 Personal history of other diseases of the digestive system: Secondary | ICD-10-CM | POA: Diagnosis not present

## 2020-02-17 DIAGNOSIS — Z1231 Encounter for screening mammogram for malignant neoplasm of breast: Secondary | ICD-10-CM | POA: Diagnosis not present

## 2020-02-17 NOTE — Patient Instructions (Signed)
Your health issues we discussed today were:   Anemia (blood loss): 1. Have your labs completed today you are able to 2. We will schedule your colonoscopy and upper endoscopy for you 3. Further recommendations will follow your procedures. 4. Call if you have any obvious bleeding 5. If you have any symptoms such as worsening weakness, fatigue, shortness of breath, chest pain, passing out, nearly passing out then go to the emergency room 6. Call for any worsening or severe symptoms  Overall I recommend:  1. Continue your other current medications 2. Return for follow-up in 3 months 3. Call us if you have any questions or concerns   ---------------------------------------------------------------  I am glad you have gotten your COVID-19 vaccination!  Even though you are fully vaccinated you should continue to follow CDC and state/local guidelines.  ---------------------------------------------------------------   At [facility] we value your feedback. You may receive a survey about your visit today. Please share your experience as we strive to create trusting relationships with our patients to provide genuine, compassionate, quality care.  We appreciate your understanding and patience as we review any laboratory studies, imaging, and other diagnostic tests that are ordered as we care for you. Our office policy is 5 business days for review of these results, and any emergent or urgent results are addressed in a timely manner for your best interest. If you do not hear from our office in 1 week, please contact us.   We also encourage the use of MyChart, which contains your medical information for your review as well. If you are not enrolled in this feature, an access code is on this after visit summary for your convenience. Thank you for allowing [place] to be involved in your care.  It was great to see you today!  I hope you have a great Fall!!

## 2020-02-17 NOTE — H&P (View-Only) (Signed)
Primary Care Physician:  Rosita Fire, MD Primary Gastroenterologist:  Dr. Abbey Chatters  Chief Complaint  Patient presents with  . Anemia    HPI:   [name] is a 74 y.o. female who presents on referral from primary care for anemia with a history of GI bleed.  The patient was last seen in our office 04/19/2014 for colitis.  At that time noted visit in October 2015 with lower GI bleed and colitis.  Melena in January of that year as well.  EGD with cervical esophageal web status post disruption, large hiatal hernia with Lysbeth Galas lesions, chronic active gastritis.  CT of the abdomen and pelvis without contrast with wall thickening involving of ascending colon (similar findings in 2012 when she presented with ischemic colitis).  She was treated with infectious colitis during her hospitalization follow-up with Cipro and Flagyl.  Felt likely ischemic colitis at that time.  Stool studies were never completed.  She was heme positive.  Doing well overall at her last visit.  Recommended notify us of any recurrent bleeding.  Last colonoscopy completed 04/16/2011 which found rectal bleeding secondary to ischemic colitis which is resolving, no high risk lesions for bleeding, moderate diverticulosis, small internal hemorrhoids.  Recommended avoid volume depletion, 7 to 10 days of Cipro and Flagyl, continue probiotic, follow-up in 1 to 2 months.  Most recent CBC found in our system dated 11/18/2019 with hemoglobin of 7.9, microcytic and hypochromic.  Reviewed PCP note.  Patient has been mildly anemic since 01/29/2018 when her hemoglobin was 11.2 also microcytic and hypochromic.  Today she states she doing okay overall. She states at her last colonoscopy she was recommended to have a 5 year repeat; no definitive recommendation could be found in our system. Denies GERD symptoms, abdominal pain, N/V, hematochezia, melena, fever, chills, unintentional weight loss. Has ahd a runny nose recently. Denies other URI or  flu-like symptoms. Denies loss of sense of taste or smell. The patient has received COVID-19 vaccination(s). Denies chest pain, dyspnea, dizziness, lightheadedness, syncope, near syncope. Denies any other upper or lower GI symptoms.  Past Medical History:  Diagnosis Date  . Anxiety   . CAD (coronary artery disease)    Multivessel status post CABG 1998  . Cholelithiasis   . Chronic back pain   . Colitis, ischemic ([place]) 03/2011  . COPD (chronic obstructive pulmonary disease) ([place])   . Diverticulosis   . Essential hypertension   . GERD (gastroesophageal reflux disease)   . GI bleed 03/2011  . Hiatal hernia   . Multiple pulmonary nodules 03/2011  . Nephrolithiasis     Past Surgical History:  Procedure Laterality Date  . ABDOMINAL HYSTERECTOMY    . COLONOSCOPY  04/16/2011   Dr. Oneida Alar: moderate diverticulosis, small internal hemorrhoids, likely ischemic colitis  . CORONARY ARTERY BYPASS GRAFT  1998  . [place]  . ESOPHAGOGASTRODUODENOSCOPY N/A 06/23/2013   Dr. Gala Romney: cervical esophageal web likely s/p dilation with scope, non-critical Schatzki's ring, large hiatal hernia with Lysbeth Galas lesions, chronic active gastritis  . SALPINGOOPHORECTOMY Left   . SMALL INTESTINE SURGERY    . SPINAL FUSION     x3    Current Outpatient Medications  Medication Sig Dispense Refill  . aspirin EC 81 MG tablet Take 81 mg by mouth daily. Swallow whole.    Marland Kitchen atorvastatin (LIPITOR) 40 MG tablet Take 40 mg by mouth at bedtime.     . cholecalciferol (VITAMIN D) 1000 UNITS tablet Take 1,000 Units by mouth  daily.      . Ferrous Sulfate Dried (FERROUS SULFATE CR PO) Take 150 mg by mouth at bedtime.     Marland Kitchen lisinopril (PRINIVIL,ZESTRIL) 10 MG tablet Take 10 mg by mouth daily.      . metoprolol succinate (TOPROL-XL) 25 MG 24 hr tablet Take 25 mg by mouth 2 (two) times daily.     . pantoprazole (PROTONIX) 40 MG tablet Take 1 tablet (40 mg total) by mouth 2 (two) times daily. (Patient taking  differently: Take 40 mg by mouth daily. ) 60 tablet 3  . loratadine (CLARITIN) 10 MG tablet Take 10 mg by mouth daily.     No current facility-administered medications for this visit.    Allergies as of 02/17/2020 - Review Complete 02/17/2020  Allergen Reaction Noted  . Omnipaque [iohexol] Other (See Comments) 09/19/2010  . Penicillins Other (See Comments) 09/19/2010    Family History  Problem Relation Age of Onset  . Stomach cancer Mother 71  . Breast cancer Sister 51  . Diabetes Mellitus II Sister   . Colon cancer Neg Hx   . Esophageal cancer Neg Hx     Social History   Socioeconomic History  . Marital status: Divorced    Spouse name: Not on file  . Number of children: 0  . Years of education: Not on file  . Highest education level: Not on file  Occupational History  . Occupation: disabled    Fish farm manager: retired  Tobacco Use  . Smoking status: Former Smoker    Packs/day: 1.00    Years: 40.00    Pack years: 40.00    Types: Cigarettes    Quit date: 09/14/2010    Years since quitting: 9.4  . Smokeless tobacco: Never Used  Substance and Sexual Activity  . Alcohol use: No  . Drug use: No    Comment: marijuana (QUIT couple yrs ago)  . Sexual activity: Not Currently  Other Topics Concern  . Not on file  Social History Narrative   Lives alone   Social Determinants of Health   Financial Resource Strain:   . Difficulty of Paying Living Expenses: Not on file  Food Insecurity:   . Worried About Charity fundraiser in the Last Year: Not on file  . Ran Out of Food in the Last Year: Not on file  Transportation Needs:   . Lack of Transportation (Medical): Not on file  . Lack of Transportation (Non-Medical): Not on file  Physical Activity:   . Days of Exercise per Week: Not on file  . Minutes of Exercise per Session: Not on file  Stress:   . Feeling of Stress : Not on file  Social Connections:   . Frequency of Communication with Friends and Family: Not on file  .  Frequency of Social Gatherings with Friends and Family: Not on file  . Attends Religious Services: Not on file  . Active Member of Clubs or Organizations: Not on file  . Attends Archivist Meetings: Not on file  . Marital Status: Not on file  Intimate Partner Violence:   . Fear of Current or Ex-Partner: Not on file  . Emotionally Abused: Not on file  . Physically Abused: Not on file  . Sexually Abused: Not on file    Subjective: Review of Systems  Constitutional: Negative for chills, fever, malaise/fatigue and weight loss.  HENT: Negative for congestion and sore throat.   Respiratory: Negative for cough and shortness of breath.   Cardiovascular: Negative for  chest pain and palpitations.  Gastrointestinal: Negative for abdominal pain, blood in stool, diarrhea, melena, nausea and vomiting.  Musculoskeletal: Negative for joint pain and myalgias.  Skin: Negative for rash.  Neurological: Negative for dizziness and weakness.  Endo/Heme/Allergies: Does not bruise/bleed easily.  Psychiatric/Behavioral: Negative for depression. The patient is not nervous/anxious.   All other systems reviewed and are negative.      Objective: BP 132/60   Pulse 60   Temp 97.8 F (36.6 C) (Temporal)   Ht _0  (1.6 m)   Wt 182 lb (82.6 kg)   BMI 32.24 kg/m  Physical Exam Vitals and nursing note reviewed.  Constitutional:      General: She is not in acute distress.    Appearance: Normal appearance. She is well-developed. She is not ill-appearing, toxic-appearing or diaphoretic.  HENT:     Head: Normocephalic and atraumatic.     Nose: No congestion or rhinorrhea.  Eyes:     General: No scleral icterus. Cardiovascular:     Rate and Rhythm: Normal rate and regular rhythm.     Heart sounds: Normal heart sounds.  Pulmonary:     Effort: Pulmonary effort is normal. No respiratory distress.     Breath sounds: Normal breath sounds.  Abdominal:     General: Bowel sounds are normal.      Palpations: Abdomen is soft. There is no hepatomegaly, splenomegaly or mass.     Tenderness: There is no abdominal tenderness. There is no guarding or rebound.     Hernia: No hernia is present.  Skin:    General: Skin is warm and dry.     Coloration: Skin is not jaundiced.     Findings: No rash.  Neurological:     General: No focal deficit present.     Mental Status: She is alert and oriented to person, place, and time.  Psychiatric:        Attention and Perception: Attention normal.        Mood and Affect: Mood normal.        Speech: Speech normal.        Behavior: Behavior normal.        Thought Content: Thought content normal.        Cognition and Memory: Cognition and memory normal.      Assessment:  Very pleasant 74 year old female who presents on referral from primary care for anemia and a history of GI bleed.  Notable history as per HPI.  Most recent colonoscopy in 2012 with recommended 5-year repeat, currently overdue.  Documented anemia with indices indicative of likely slow chronic bleed.  She is generally asymptomatic from a GI standpoint today.  At this point we will proceed with anemia work-up including initial endoscopy and colonoscopy.  She may benefit from a Givens capsule endoscopy in the future.  I will also check formal iron studies and give ER precautions for significant bleeding.  Proceed with TCS and EGD with Dr. Abbey Chatters on propofol/MAC in near future: the risks, benefits, and alternatives have been discussed with the patient in detail. The patient states understanding and desires to proceed.  ASA III  Will need to hold iron x 1 week prior to procedure   Plan: 1. CBC. Iron, Ferritin 2. TCS and EGD 3. Call if any obvious bleeding 4. ER precautions given 5. Follow-up in 3 months 6. Further recommendations to follow    Thank you for allowing [place] to participate in the care of [place],  DNP, AGNP-C Adult & Gerontological Nurse  Practitioner [facility]   03/06/2020 8:15 PM   Disclaimer: This note was dictated with voice recognition software. Similar sounding words can inadvertently be transcribed and may not be corrected upon review.

## 2020-02-17 NOTE — Progress Notes (Signed)
  Primary Care Physician:  Fanta, Tesfaye, MD Primary Gastroenterologist:  Dr. Carver  Chief Complaint  Patient presents with  . Anemia    HPI:   [name] is a 74 y.o. female who presents on referral from primary care for anemia with a history of GI bleed.  The patient was last seen in our office 04/19/2014 for colitis.  At that time noted visit in October 2015 with lower GI bleed and colitis.  Melena in January of that year as well.  EGD with cervical esophageal web status post disruption, large hiatal hernia with Cameron lesions, chronic active gastritis.  CT of the abdomen and pelvis without contrast with wall thickening involving of ascending colon (similar findings in 2012 when she presented with ischemic colitis).  She was treated with infectious colitis during her hospitalization follow-up with Cipro and Flagyl.  Felt likely ischemic colitis at that time.  Stool studies were never completed.  She was heme positive.  Doing well overall at her last visit.  Recommended notify us of any recurrent bleeding.  Last colonoscopy completed 04/16/2011 which found rectal bleeding secondary to ischemic colitis which is resolving, no high risk lesions for bleeding, moderate diverticulosis, small internal hemorrhoids.  Recommended avoid volume depletion, 7 to 10 days of Cipro and Flagyl, continue probiotic, follow-up in 1 to 2 months.  Most recent CBC found in our system dated 11/18/2019 with hemoglobin of 7.9, microcytic and hypochromic.  Reviewed PCP note.  Patient has been mildly anemic since 01/29/2018 when her hemoglobin was 11.2 also microcytic and hypochromic.  Today she states she doing okay overall. She states at her last colonoscopy she was recommended to have a 5 year repeat; no definitive recommendation could be found in our system. Denies GERD symptoms, abdominal pain, N/V, hematochezia, melena, fever, chills, unintentional weight loss. Has ahd a runny nose recently. Denies other URI or  flu-like symptoms. Denies loss of sense of taste or smell. The patient has received COVID-19 vaccination(s). Denies chest pain, dyspnea, dizziness, lightheadedness, syncope, near syncope. Denies any other upper or lower GI symptoms.  Past Medical History:  Diagnosis Date  . Anxiety   . CAD (coronary artery disease)    Multivessel status post CABG 1998  . Cholelithiasis   . Chronic back pain   . Colitis, ischemic ([facility]) 03/2011  . COPD (chronic obstructive pulmonary disease) ([facility])   . Diverticulosis   . Essential hypertension   . GERD (gastroesophageal reflux disease)   . GI bleed 03/2011  . Hiatal hernia   . Multiple pulmonary nodules 03/2011  . Nephrolithiasis     Past Surgical History:  Procedure Laterality Date  . ABDOMINAL HYSTERECTOMY    . COLONOSCOPY  04/16/2011   Dr. Fields: moderate diverticulosis, small internal hemorrhoids, likely ischemic colitis  . CORONARY ARTERY BYPASS GRAFT  1998  . CORONARY STENT PLACEMENT  1995  . ESOPHAGOGASTRODUODENOSCOPY N/A 06/23/2013   Dr. Rourk: cervical esophageal web likely s/p dilation with scope, non-critical Schatzki's ring, large hiatal hernia with Cameron lesions, chronic active gastritis  . SALPINGOOPHORECTOMY Left   . SMALL INTESTINE SURGERY    . SPINAL FUSION     x3    Current Outpatient Medications  Medication Sig Dispense Refill  . aspirin EC 81 MG tablet Take 81 mg by mouth daily. Swallow whole.    . atorvastatin (LIPITOR) 40 MG tablet Take 40 mg by mouth at bedtime.     . cholecalciferol (VITAMIN D) 1000 UNITS tablet Take 1,000 Units by mouth   daily.      . Ferrous Sulfate Dried (FERROUS SULFATE CR PO) Take 150 mg by mouth at bedtime.     . lisinopril (PRINIVIL,ZESTRIL) 10 MG tablet Take 10 mg by mouth daily.      . metoprolol succinate (TOPROL-XL) 25 MG 24 hr tablet Take 25 mg by mouth 2 (two) times daily.     . pantoprazole (PROTONIX) 40 MG tablet Take 1 tablet (40 mg total) by mouth 2 (two) times daily. (Patient taking  differently: Take 40 mg by mouth daily. ) 60 tablet 3  . loratadine (CLARITIN) 10 MG tablet Take 10 mg by mouth daily.     No current facility-administered medications for this visit.    Allergies as of 02/17/2020 - Review Complete 02/17/2020  Allergen Reaction Noted  . Omnipaque [iohexol] Other (See Comments) 09/19/2010  . Penicillins Other (See Comments) 09/19/2010    Family History  Problem Relation Age of Onset  . Stomach cancer Mother 69  . Breast cancer Sister 67  . Diabetes Mellitus II Sister   . Colon cancer Neg Hx   . Esophageal cancer Neg Hx     Social History   Socioeconomic History  . Marital status: Divorced    Spouse name: Not on file  . Number of children: 0  . Years of education: Not on file  . Highest education level: Not on file  Occupational History  . Occupation: disabled    Employer: retired  Tobacco Use  . Smoking status: Former Smoker    Packs/day: 1.00    Years: 40.00    Pack years: 40.00    Types: Cigarettes    Quit date: 09/14/2010    Years since quitting: 9.4  . Smokeless tobacco: Never Used  Substance and Sexual Activity  . Alcohol use: No  . Drug use: No    Comment: marijuana (QUIT couple yrs ago)  . Sexual activity: Not Currently  Other Topics Concern  . Not on file  Social History Narrative   Lives alone   Social Determinants of Health   Financial Resource Strain:   . Difficulty of Paying Living Expenses: Not on file  Food Insecurity:   . Worried About Running Out of Food in the Last Year: Not on file  . Ran Out of Food in the Last Year: Not on file  Transportation Needs:   . Lack of Transportation (Medical): Not on file  . Lack of Transportation (Non-Medical): Not on file  Physical Activity:   . Days of Exercise per Week: Not on file  . Minutes of Exercise per Session: Not on file  Stress:   . Feeling of Stress : Not on file  Social Connections:   . Frequency of Communication with Friends and Family: Not on file  .  Frequency of Social Gatherings with Friends and Family: Not on file  . Attends Religious Services: Not on file  . Active Member of Clubs or Organizations: Not on file  . Attends Club or Organization Meetings: Not on file  . Marital Status: Not on file  Intimate Partner Violence:   . Fear of Current or Ex-Partner: Not on file  . Emotionally Abused: Not on file  . Physically Abused: Not on file  . Sexually Abused: Not on file    Subjective: Review of Systems  Constitutional: Negative for chills, fever, malaise/fatigue and weight loss.  HENT: Negative for congestion and sore throat.   Respiratory: Negative for cough and shortness of breath.   Cardiovascular: Negative for   chest pain and palpitations.  Gastrointestinal: Negative for abdominal pain, blood in stool, diarrhea, melena, nausea and vomiting.  Musculoskeletal: Negative for joint pain and myalgias.  Skin: Negative for rash.  Neurological: Negative for dizziness and weakness.  Endo/Heme/Allergies: Does not bruise/bleed easily.  Psychiatric/Behavioral: Negative for depression. The patient is not nervous/anxious.   All other systems reviewed and are negative.      Objective: BP 132/60   Pulse 60   Temp 97.8 F (36.6 C) (Temporal)   Ht 5' 3" (1.6 m)   Wt 182 lb (82.6 kg)   BMI 32.24 kg/m  Physical Exam Vitals and nursing note reviewed.  Constitutional:      General: She is not in acute distress.    Appearance: Normal appearance. She is well-developed. She is not ill-appearing, toxic-appearing or diaphoretic.  HENT:     Head: Normocephalic and atraumatic.     Nose: No congestion or rhinorrhea.  Eyes:     General: No scleral icterus. Cardiovascular:     Rate and Rhythm: Normal rate and regular rhythm.     Heart sounds: Normal heart sounds.  Pulmonary:     Effort: Pulmonary effort is normal. No respiratory distress.     Breath sounds: Normal breath sounds.  Abdominal:     General: Bowel sounds are normal.      Palpations: Abdomen is soft. There is no hepatomegaly, splenomegaly or mass.     Tenderness: There is no abdominal tenderness. There is no guarding or rebound.     Hernia: No hernia is present.  Skin:    General: Skin is warm and dry.     Coloration: Skin is not jaundiced.     Findings: No rash.  Neurological:     General: No focal deficit present.     Mental Status: She is alert and oriented to person, place, and time.  Psychiatric:        Attention and Perception: Attention normal.        Mood and Affect: Mood normal.        Speech: Speech normal.        Behavior: Behavior normal.        Thought Content: Thought content normal.        Cognition and Memory: Cognition and memory normal.      Assessment:  Very pleasant 74-year-old female who presents on referral from primary care for anemia and a history of GI bleed.  Notable history as per HPI.  Most recent colonoscopy in 2012 with recommended 5-year repeat, currently overdue.  Documented anemia with indices indicative of likely slow chronic bleed.  She is generally asymptomatic from a GI standpoint today.  At this point we will proceed with anemia work-up including initial endoscopy and colonoscopy.  She may benefit from a Givens capsule endoscopy in the future.  I will also check formal iron studies and give ER precautions for significant bleeding.  Proceed with TCS and EGD with Dr. Carver on propofol/MAC in near future: the risks, benefits, and alternatives have been discussed with the patient in detail. The patient states understanding and desires to proceed.  ASA III  Will need to hold iron x 1 week prior to procedure   Plan: 1. CBC. Iron, Ferritin 2. TCS and EGD 3. Call if any obvious bleeding 4. ER precautions given 5. Follow-up in 3 months 6. Further recommendations to follow    Thank you for allowing us to participate in the care of Vonnie C Mancillas  Sebrina Kessner,   DNP, AGNP-C Adult & Gerontological Nurse  Practitioner [facility]   03/06/2020 8:15 PM   Disclaimer: This note was dictated with voice recognition software. Similar sounding words can inadvertently be transcribed and may not be corrected upon review.  

## 2020-02-18 ENCOUNTER — Encounter: Payer: Self-pay | Admitting: Internal Medicine

## 2020-02-18 ENCOUNTER — Encounter: Payer: Self-pay | Admitting: *Deleted

## 2020-02-18 ENCOUNTER — Other Ambulatory Visit (HOSPITAL_COMMUNITY)
Admission: RE | Admit: 2020-02-18 | Discharge: 2020-02-18 | Disposition: A | Discharge status: Home / Self Care | Payer: Medicare PPO | Source: Ambulatory Visit | Attending: Nurse Practitioner | Admitting: Nurse Practitioner

## 2020-02-18 ENCOUNTER — Telehealth: Payer: Self-pay | Admitting: *Deleted

## 2020-02-18 DIAGNOSIS — Z8719 Personal history of other diseases of the digestive system: Secondary | ICD-10-CM | POA: Diagnosis not present

## 2020-02-18 DIAGNOSIS — D649 Anemia, unspecified: Secondary | ICD-10-CM | POA: Insufficient documentation

## 2020-02-18 LAB — CBC WITH DIFFERENTIAL/PLATELET
Abs Immature Granulocytes: 0.02 10*3/uL (ref 0.00–0.07)
Basophils Absolute: 0.1 10*3/uL (ref 0.0–0.1)
Basophils Relative: 1 %
Eosinophils Absolute: 0.3 10*3/uL (ref 0.0–0.5)
Eosinophils Relative: 4 %
HCT: 30.7 % — ABNORMAL LOW (ref 36.0–46.0)
Hemoglobin: 7.7 g/dL — ABNORMAL LOW (ref 12.0–15.0)
Immature Granulocytes: 0 %
Lymphocytes Relative: 17 %
Lymphs Abs: 1.3 10*3/uL (ref 0.7–4.0)
MCH: 15.1 pg — ABNORMAL LOW (ref 26.0–34.0)
MCHC: 25.1 g/dL — ABNORMAL LOW (ref 30.0–36.0)
MCV: 60.3 fL — ABNORMAL LOW (ref 80.0–100.0)
Monocytes Absolute: 0.4 10*3/uL (ref 0.1–1.0)
Monocytes Relative: 5 %
Neutro Abs: 5.5 10*3/uL (ref 1.7–7.7)
Neutrophils Relative %: 73 %
Platelets: 424 10*3/uL — ABNORMAL HIGH (ref 150–400)
RBC: 5.09 MIL/uL (ref 3.87–5.11)
RDW: 21.2 % — ABNORMAL HIGH (ref 11.5–15.5)
WBC: 7.6 10*3/uL (ref 4.0–10.5)
nRBC: 0 % (ref 0.0–0.2)

## 2020-02-18 LAB — FERRITIN: Ferritin: 3 ng/mL — ABNORMAL LOW (ref 11–307)

## 2020-02-18 LAB — IRON AND TIBC
Iron: 23 ug/dL — ABNORMAL LOW (ref 28–170)
Saturation Ratios: 5 % — ABNORMAL LOW (ref 10.4–31.8)
TIBC: 504 ug/dL — ABNORMAL HIGH (ref 250–450)
UIBC: 481 ug/dL

## 2020-02-18 LAB — IRON: Iron: 22 ug/dL — ABNORMAL LOW (ref 28–170)

## 2020-02-18 NOTE — Telephone Encounter (Signed)
PA for TCS/EGD approved via [employer]. Auth# [id number] dates 03/14/2020-04/13/2020

## 2020-02-18 NOTE — Telephone Encounter (Signed)
Called pt. She has been scheduled for tcs/egd with Dr. Marletta Lor on 10/19 at 1:00pm. Patient aware will mail instructions with pre-op/covid test appt. Confirmed mailing address.

## 2020-02-21 DIAGNOSIS — E785 Hyperlipidemia, unspecified: Secondary | ICD-10-CM | POA: Diagnosis not present

## 2020-02-21 DIAGNOSIS — I2510 Atherosclerotic heart disease of native coronary artery without angina pectoris: Secondary | ICD-10-CM | POA: Diagnosis not present

## 2020-03-06 ENCOUNTER — Encounter: Payer: Self-pay | Admitting: Nurse Practitioner

## 2020-03-07 NOTE — Patient Instructions (Signed)
[name]  03/07/2020     @PREFPERIOPPHARMACY @   Your procedure is scheduled on  03/14/2020.  Report to 03/16/2020 at  1045  A.M.  Call this number if you have problems the morning of surgery:  [phone]   Remember:  Follow the diet and prep instructions given to you by the office.                     Take these medicines the morning of surgery with A SIP OF WATER  Metoprolol, protonix.    Do not wear jewelry, make-up or nail polish.  Do not wear lotions, powders, or perfumes. Please wear deodorant and brush your teeth.  Do not shave 48 hours prior to surgery.  Men may shave face and neck.  Do not bring valuables to the hospital.  [facility] is not responsible for any belongings or valuables.  Contacts, dentures or bridgework may not be worn into surgery.  Leave your suitcase in the car.  After surgery it may be brought to your room.  For patients admitted to the hospital, discharge time will be determined by your treatment team.  Patients discharged the day of surgery will not be allowed to drive home.   Name and phone number of your driver:   family Special instructions:   DO NOT smoke the morning of your procedure.  Please read over the following fact sheets that you were given. Anesthesia Post-op Instructions and Care and Recovery After Surgery       Upper Endoscopy, Adult, Care After This sheet gives you information about how to care for yourself after your procedure. Your health care provider may also give you more specific instructions. If you have problems or questions, contact your health care provider. What can I expect after the procedure? After the procedure, it is common to have:  A sore throat.  Mild stomach pain or discomfort.  Bloating.  Nausea. Follow these instructions at home:   Follow instructions from your health care provider about what to eat or drink after your procedure.  Return to your normal activities as told by  your health care provider. Ask your health care provider what activities are safe for you.  Take over-the-counter and prescription medicines only as told by your health care provider.  Do not drive for 24 hours if you were given a sedative during your procedure.  Keep all follow-up visits as told by your health care provider. This is important. Contact a health care provider if you have:  A sore throat that lasts longer than one day.  Trouble swallowing. Get help right away if:  You vomit blood or your vomit looks like coffee grounds.  You have: ? A fever. ? Bloody, black, or tarry stools. ? A severe sore throat or you cannot swallow. ? Difficulty breathing. ? Severe pain in your chest or abdomen. Summary  After the procedure, it is common to have a sore throat, mild stomach discomfort, bloating, and nausea.  Do not drive for 24 hours if you were given a sedative during the procedure.  Follow instructions from your health care provider about what to eat or drink after your procedure.  Return to your normal activities as told by your health care provider. This information is not intended to replace advice given to you by your health care provider. Make sure you discuss any questions you have with your health care provider. Document Revised:  11/04/2017 Document Reviewed: 10/13/2017 Elsevier Patient Education  [place].  Colonoscopy, Adult, Care After This sheet gives you information about how to care for yourself after your procedure. Your health care provider may also give you more specific instructions. If you have problems or questions, contact your health care provider. What can I expect after the procedure? After the procedure, it is common to have:  A small amount of blood in your stool for 24 hours after the procedure.  Some gas.  Mild cramping or bloating of your abdomen. Follow these instructions at home: Eating and drinking   Drink enough fluid to  keep your urine pale yellow.  Follow instructions from your health care provider about eating or drinking restrictions.  Resume your normal diet as instructed by your health care provider. Avoid heavy or fried foods that are hard to digest. Activity  Rest as told by your health care provider.  Avoid sitting for a long time without moving. Get up to take short walks every 1-2 hours. This is important to improve blood flow and breathing. Ask for help if you feel weak or unsteady.  Return to your normal activities as told by your health care provider. Ask your health care provider what activities are safe for you. Managing cramping and bloating   Try walking around when you have cramps or feel bloated.  Apply heat to your abdomen as told by your health care provider. Use the heat source that your health care provider recommends, such as a moist heat pack or a heating pad. ? Place a towel between your skin and the heat source. ? Leave the heat on for 20-30 minutes. ? Remove the heat if your skin turns bright red. This is especially important if you are unable to feel pain, heat, or cold. You may have a greater risk of getting burned. General instructions  For the first 24 hours after the procedure: ? Do not drive or use machinery. ? Do not sign important documents. ? Do not drink alcohol. ? Do your regular daily activities at a slower pace than normal. ? Eat soft foods that are easy to digest.  Take over-the-counter and prescription medicines only as told by your health care provider.  Keep all follow-up visits as told by your health care provider. This is important. Contact a health care provider if:  You have blood in your stool 2-3 days after the procedure. Get help right away if you have:  More than a small spotting of blood in your stool.  Large blood clots in your stool.  Swelling of your abdomen.  Nausea or vomiting.  A fever.  Increasing pain in your abdomen that  is not relieved with medicine. Summary  After the procedure, it is common to have a small amount of blood in your stool. You may also have mild cramping and bloating of your abdomen.  For the first 24 hours after the procedure, do not drive or use machinery, sign important documents, or drink alcohol.  Get help right away if you have a lot of blood in your stool, nausea or vomiting, a fever, or increased pain in your abdomen. This information is not intended to replace advice given to you by your health care provider. Make sure you discuss any questions you have with your health care provider. Document Revised: 12/07/2018 Document Reviewed: 12/07/2018 Elsevier Patient Education  [place] After These instructions provide you with information about caring for yourself after  your procedure. Your health care provider may also give you more specific instructions. Your treatment has been planned according to current medical practices, but problems sometimes occur. Call your health care provider if you have any problems or questions after your procedure. What can I expect after the procedure? After your procedure, you may:  Feel sleepy for several hours.  Feel clumsy and have poor balance for several hours.  Feel forgetful about what happened after the procedure.  Have poor judgment for several hours.  Feel nauseous or vomit.  Have a sore throat if you had a breathing tube during the procedure. Follow these instructions at home: For at least 24 hours after the procedure:      Have a responsible adult stay with you. It is important to have someone help care for you until you are awake and alert.  Rest as needed.  Do not: ? Participate in activities in which you could fall or become injured. ? Drive. ? Use heavy machinery. ? Drink alcohol. ? Take sleeping pills or medicines that cause drowsiness. ? Make important decisions or sign legal  documents. ? Take care of children on your own. Eating and drinking  Follow the diet that is recommended by your health care provider.  If you vomit, drink water, juice, or soup when you can drink without vomiting.  Make sure you have little or no nausea before eating solid foods. General instructions  Take over-the-counter and prescription medicines only as told by your health care provider.  If you have sleep apnea, surgery and certain medicines can increase your risk for breathing problems. Follow instructions from your health care provider about wearing your sleep device: ? Anytime you are sleeping, including during daytime naps. ? While taking prescription pain medicines, sleeping medicines, or medicines that make you drowsy.  If you smoke, do not smoke without supervision.  Keep all follow-up visits as told by your health care provider. This is important. Contact a health care provider if:  You keep feeling nauseous or you keep vomiting.  You feel light-headed.  You develop a rash.  You have a fever. Get help right away if:  You have trouble breathing. Summary  For several hours after your procedure, you may feel sleepy and have poor judgment.  Have a responsible adult stay with you for at least 24 hours or until you are awake and alert. This information is not intended to replace advice given to you by your health care provider. Make sure you discuss any questions you have with your health care provider. Document Revised: 08/11/2017 Document Reviewed: 09/03/2015 Elsevier Patient Education  [place].

## 2020-03-09 ENCOUNTER — Encounter (HOSPITAL_COMMUNITY)
Admission: RE | Admit: 2020-03-09 | Discharge: 2020-03-09 | Disposition: A | Discharge status: Home / Self Care | Payer: Medicare PPO | Source: Ambulatory Visit | Attending: Internal Medicine | Admitting: Internal Medicine

## 2020-03-09 ENCOUNTER — Other Ambulatory Visit: Payer: Self-pay

## 2020-03-09 ENCOUNTER — Encounter (HOSPITAL_COMMUNITY): Payer: Self-pay

## 2020-03-09 DIAGNOSIS — Z01818 Encounter for other preprocedural examination: Secondary | ICD-10-CM | POA: Insufficient documentation

## 2020-03-09 LAB — CBC WITH DIFFERENTIAL/PLATELET
Abs Immature Granulocytes: 0.05 10*3/uL (ref 0.00–0.07)
Basophils Absolute: 0.1 10*3/uL (ref 0.0–0.1)
Basophils Relative: 1 %
Eosinophils Absolute: 0.4 10*3/uL (ref 0.0–0.5)
Eosinophils Relative: 5 %
HCT: 30.3 % — ABNORMAL LOW (ref 36.0–46.0)
Hemoglobin: 7.7 g/dL — ABNORMAL LOW (ref 12.0–15.0)
Immature Granulocytes: 1 %
Lymphocytes Relative: 19 %
Lymphs Abs: 1.5 10*3/uL (ref 0.7–4.0)
MCH: 16.4 pg — ABNORMAL LOW (ref 26.0–34.0)
MCHC: 25.4 g/dL — ABNORMAL LOW (ref 30.0–36.0)
MCV: 64.5 fL — ABNORMAL LOW (ref 80.0–100.0)
Monocytes Absolute: 0.5 10*3/uL (ref 0.1–1.0)
Monocytes Relative: 6 %
Neutro Abs: 5.5 10*3/uL (ref 1.7–7.7)
Neutrophils Relative %: 68 %
Platelets: 372 10*3/uL (ref 150–400)
RBC: 4.7 MIL/uL (ref 3.87–5.11)
RDW: 25.8 % — ABNORMAL HIGH (ref 11.5–15.5)
WBC: 8 10*3/uL (ref 4.0–10.5)
nRBC: 0 % (ref 0.0–0.2)

## 2020-03-13 ENCOUNTER — Other Ambulatory Visit (HOSPITAL_COMMUNITY)
Admission: RE | Admit: 2020-03-13 | Discharge: 2020-03-13 | Disposition: A | Discharge status: Home / Self Care | Payer: Medicare PPO | Source: Ambulatory Visit | Attending: Internal Medicine | Admitting: Internal Medicine

## 2020-03-13 ENCOUNTER — Other Ambulatory Visit: Payer: Self-pay

## 2020-03-13 ENCOUNTER — Telehealth: Payer: Self-pay

## 2020-03-13 DIAGNOSIS — Z01818 Encounter for other preprocedural examination: Secondary | ICD-10-CM | POA: Diagnosis not present

## 2020-03-13 DIAGNOSIS — Z20822 Contact with and (suspected) exposure to covid-19: Secondary | ICD-10-CM | POA: Diagnosis not present

## 2020-03-13 LAB — SARS CORONAVIRUS 2 (TAT 6-24 HRS): SARS Coronavirus 2: NEGATIVE

## 2020-03-13 MED ORDER — PEG 3350-KCL-NA BICARB-NACL 420 G PO SOLR: 4000.0000 mL | Freq: Once | ORAL | 0 refills | Status: AC

## 2020-03-13 NOTE — Telephone Encounter (Signed)
Pt came by the office, very confused about her instructions for her procedure tomorrow. She wanted to know if she could just drink a bottle of mag citrate instead of doing the miralax. She is concerned about being able to measure it correctly and taking it correctly. I went over her instructions with her and what medications she is taking. She commented that last time she had the "big jug" she had to drink and it was very easy for her. I called her pharmacy and they have it in stock. I asked the pt if she wanted me to send it in and do new instructions for her and she said yes. I sent in rx and printed new instructions. I have gone over all the instructions and strongly encouraged the pt to make sure she drinks plenty of clear liquids and follow the instructions.  Advised the pt that if she isn't cleaned out she will have to reschedule the procedure and do this prep all over again. Pt verbalized understanding. She also stated she did have someone to bring her to the hospital tomorrow and she was aware of what time she has to be there.   Routing to [employer].

## 2020-03-14 ENCOUNTER — Ambulatory Visit (HOSPITAL_COMMUNITY): Payer: Medicare PPO | Admitting: Anesthesiology

## 2020-03-14 ENCOUNTER — Encounter (HOSPITAL_COMMUNITY)
Admission: RE | Disposition: A | Discharge status: Home / Self Care | Payer: Self-pay | Source: Home / Self Care | Attending: Internal Medicine

## 2020-03-14 ENCOUNTER — Encounter (HOSPITAL_COMMUNITY): Payer: Self-pay

## 2020-03-14 ENCOUNTER — Ambulatory Visit (HOSPITAL_COMMUNITY)
Admission: RE | Admit: 2020-03-14 | Discharge: 2020-03-14 | Disposition: A | Discharge status: Home / Self Care | Payer: Medicare PPO | Attending: Internal Medicine | Admitting: Internal Medicine

## 2020-03-14 DIAGNOSIS — D649 Anemia, unspecified: Secondary | ICD-10-CM | POA: Diagnosis not present

## 2020-03-14 DIAGNOSIS — M549 Dorsalgia, unspecified: Secondary | ICD-10-CM | POA: Insufficient documentation

## 2020-03-14 DIAGNOSIS — D509 Iron deficiency anemia, unspecified: Secondary | ICD-10-CM | POA: Insufficient documentation

## 2020-03-14 DIAGNOSIS — J449 Chronic obstructive pulmonary disease, unspecified: Secondary | ICD-10-CM | POA: Insufficient documentation

## 2020-03-14 DIAGNOSIS — Z880 Allergy status to penicillin: Secondary | ICD-10-CM | POA: Insufficient documentation

## 2020-03-14 DIAGNOSIS — I10 Essential (primary) hypertension: Secondary | ICD-10-CM | POA: Insufficient documentation

## 2020-03-14 DIAGNOSIS — G8929 Other chronic pain: Secondary | ICD-10-CM | POA: Insufficient documentation

## 2020-03-14 DIAGNOSIS — Z87891 Personal history of nicotine dependence: Secondary | ICD-10-CM | POA: Insufficient documentation

## 2020-03-14 DIAGNOSIS — R918 Other nonspecific abnormal finding of lung field: Secondary | ICD-10-CM | POA: Diagnosis not present

## 2020-03-14 DIAGNOSIS — Z8719 Personal history of other diseases of the digestive system: Secondary | ICD-10-CM | POA: Insufficient documentation

## 2020-03-14 DIAGNOSIS — K449 Diaphragmatic hernia without obstruction or gangrene: Secondary | ICD-10-CM | POA: Diagnosis not present

## 2020-03-14 DIAGNOSIS — F419 Anxiety disorder, unspecified: Secondary | ICD-10-CM | POA: Diagnosis not present

## 2020-03-14 DIAGNOSIS — Z79899 Other long term (current) drug therapy: Secondary | ICD-10-CM | POA: Insufficient documentation

## 2020-03-14 DIAGNOSIS — I2510 Atherosclerotic heart disease of native coronary artery without angina pectoris: Secondary | ICD-10-CM | POA: Insufficient documentation

## 2020-03-14 DIAGNOSIS — Z87442 Personal history of urinary calculi: Secondary | ICD-10-CM | POA: Insufficient documentation

## 2020-03-14 DIAGNOSIS — Z955 Presence of coronary angioplasty implant and graft: Secondary | ICD-10-CM | POA: Insufficient documentation

## 2020-03-14 DIAGNOSIS — Z981 Arthrodesis status: Secondary | ICD-10-CM | POA: Diagnosis not present

## 2020-03-14 DIAGNOSIS — Z7982 Long term (current) use of aspirin: Secondary | ICD-10-CM | POA: Insufficient documentation

## 2020-03-14 DIAGNOSIS — K219 Gastro-esophageal reflux disease without esophagitis: Secondary | ICD-10-CM | POA: Diagnosis not present

## 2020-03-14 DIAGNOSIS — Z951 Presence of aortocoronary bypass graft: Secondary | ICD-10-CM | POA: Diagnosis not present

## 2020-03-14 DIAGNOSIS — Z90721 Acquired absence of ovaries, unilateral: Secondary | ICD-10-CM | POA: Insufficient documentation

## 2020-03-14 DIAGNOSIS — Z888 Allergy status to other drugs, medicaments and biological substances status: Secondary | ICD-10-CM | POA: Diagnosis not present

## 2020-03-14 DIAGNOSIS — K2970 Gastritis, unspecified, without bleeding: Secondary | ICD-10-CM | POA: Diagnosis not present

## 2020-03-14 DIAGNOSIS — K5730 Diverticulosis of large intestine without perforation or abscess without bleeding: Secondary | ICD-10-CM | POA: Diagnosis not present

## 2020-03-14 DIAGNOSIS — Z833 Family history of diabetes mellitus: Secondary | ICD-10-CM | POA: Insufficient documentation

## 2020-03-14 DIAGNOSIS — K648 Other hemorrhoids: Secondary | ICD-10-CM | POA: Insufficient documentation

## 2020-03-14 DIAGNOSIS — Z90710 Acquired absence of both cervix and uterus: Secondary | ICD-10-CM | POA: Diagnosis not present

## 2020-03-14 DIAGNOSIS — Z803 Family history of malignant neoplasm of breast: Secondary | ICD-10-CM | POA: Insufficient documentation

## 2020-03-14 DIAGNOSIS — Z800 Family history of malignant neoplasm of digestive organs: Secondary | ICD-10-CM | POA: Insufficient documentation

## 2020-03-14 HISTORY — PX: COLONOSCOPY WITH PROPOFOL: SHX5780

## 2020-03-14 HISTORY — PX: ESOPHAGOGASTRODUODENOSCOPY (EGD) WITH PROPOFOL: SHX5813

## 2020-03-14 HISTORY — PX: BIOPSY: SHX5522

## 2020-03-14 SURGERY — COLONOSCOPY WITH PROPOFOL
Anesthesia: General

## 2020-03-14 MED ORDER — KETAMINE HCL 10 MG/ML IJ SOLN
INTRAMUSCULAR | Status: DC | PRN
  Administered 2020-03-14 (×2): 10 mg via INTRAVENOUS

## 2020-03-14 MED ORDER — GLYCOPYRROLATE 0.2 MG/ML IJ SOLN
0.2000 mg | Freq: Once | INTRAMUSCULAR | Status: AC
  Administered 2020-03-14: 0.2 mg via INTRAVENOUS

## 2020-03-14 MED ORDER — PROPOFOL 500 MG/50ML IV EMUL
INTRAVENOUS | Status: DC | PRN
  Administered 2020-03-14: 125 ug/kg/min via INTRAVENOUS

## 2020-03-14 MED ORDER — LIDOCAINE VISCOUS HCL 2 % MT SOLN
OROMUCOSAL | Status: AC
  Filled 2020-03-14: qty 15

## 2020-03-14 MED ORDER — LACTATED RINGERS IV SOLN
INTRAVENOUS | Status: DC
  Administered 2020-03-14: 1000 mL via INTRAVENOUS

## 2020-03-14 MED ORDER — LIDOCAINE VISCOUS HCL 2 % MT SOLN
15.0000 mL | Freq: Once | OROMUCOSAL | Status: AC
  Administered 2020-03-14: 15 mL via OROMUCOSAL

## 2020-03-14 MED ORDER — GLYCOPYRROLATE 0.2 MG/ML IJ SOLN
INTRAMUSCULAR | Status: AC
  Filled 2020-03-14: qty 1

## 2020-03-14 MED ORDER — CHLORHEXIDINE GLUCONATE CLOTH 2 % EX PADS: 6.0000 | MEDICATED_PAD | Freq: Once | CUTANEOUS | Status: DC

## 2020-03-14 MED ORDER — KETAMINE HCL 50 MG/5ML IJ SOSY
PREFILLED_SYRINGE | INTRAMUSCULAR | Status: AC
  Filled 2020-03-14: qty 5

## 2020-03-14 MED ORDER — EPHEDRINE 5 MG/ML INJ
INTRAVENOUS | Status: AC
  Filled 2020-03-14: qty 10

## 2020-03-14 MED ORDER — STERILE WATER FOR IRRIGATION IR SOLN
Status: DC | PRN
  Administered 2020-03-14: 200 mL

## 2020-03-14 MED ORDER — EPHEDRINE SULFATE 50 MG/ML IJ SOLN
INTRAMUSCULAR | Status: DC | PRN
  Administered 2020-03-14: 10 mg via INTRAVENOUS

## 2020-03-14 MED ORDER — PROPOFOL 10 MG/ML IV BOLUS
INTRAVENOUS | Status: DC | PRN
  Administered 2020-03-14 (×2): 40 mg via INTRAVENOUS

## 2020-03-14 NOTE — Discharge Instructions (Addendum)
EGD Discharge instructions Please read the instructions outlined below and refer to this sheet in the next few weeks. These discharge instructions provide you with general information on caring for yourself after you leave the hospital. Your doctor may also give you specific instructions. While your treatment has been planned according to the most current medical practices available, unavoidable complications occasionally occur. If you have any problems or questions after discharge, please call your doctor. ACTIVITY  You may resume your regular activity but move at a slower pace for the next 24 hours.   Take frequent rest periods for the next 24 hours.   Walking will help expel (get rid of) the air and reduce the bloated feeling in your abdomen.   No driving for 24 hours (because of the anesthesia (medicine) used during the test).   You may shower.   Do not sign any important legal documents or operate any machinery for 24 hours (because of the anesthesia used during the test).  NUTRITION  Drink plenty of fluids.   You may resume your normal diet.   Begin with a light meal and progress to your normal diet.   Avoid alcoholic beverages for 24 hours or as instructed by your caregiver.  MEDICATIONS  You may resume your normal medications unless your caregiver tells you otherwise.  WHAT YOU CAN EXPECT TODAY  You may experience abdominal discomfort such as a feeling of fullness or "gas" pains.  FOLLOW-UP  Your doctor will discuss the results of your test with you.  SEEK IMMEDIATE MEDICAL ATTENTION IF ANY OF THE FOLLOWING OCCUR:  Excessive nausea (feeling sick to your stomach) and/or vomiting.   Severe abdominal pain and distention (swelling).   Trouble swallowing.   Temperature over 101 F (37.8 C).   Rectal bleeding or vomiting of blood.     Colonoscopy Discharge Instructions  Read the instructions outlined below and refer to this sheet in the next few weeks. These  discharge instructions provide you with general information on caring for yourself after you leave the hospital. Your doctor may also give you specific instructions. While your treatment has been planned according to the most current medical practices available, unavoidable complications occasionally occur.   ACTIVITY  You may resume your regular activity, but move at a slower pace for the next 24 hours.   Take frequent rest periods for the next 24 hours.   Walking will help get rid of the air and reduce the bloated feeling in your belly (abdomen).   No driving for 24 hours (because of the medicine (anesthesia) used during the test).    Do not sign any important legal documents or operate any machinery for 24 hours (because of the anesthesia used during the test).  NUTRITION  Drink plenty of fluids.   You may resume your normal diet as instructed by your doctor.   Begin with a light meal and progress to your normal diet. Heavy or fried foods are harder to digest and may make you feel sick to your stomach (nauseated).   Avoid alcoholic beverages for 24 hours or as instructed.  MEDICATIONS  You may resume your normal medications unless your doctor tells you otherwise.  WHAT YOU CAN EXPECT TODAY  Some feelings of bloating in the abdomen.   Passage of more gas than usual.   Spotting of blood in your stool or on the toilet paper.  IF YOU HAD POLYPS REMOVED DURING THE COLONOSCOPY:  No aspirin products for 7 days or as instructed.  No alcohol for 7 days or as instructed.   Eat a soft diet for the next 24 hours.  FINDING OUT THE RESULTS OF YOUR TEST Not all test results are available during your visit. If your test results are not back during the visit, make an appointment with your caregiver to find out the results. Do not assume everything is normal if you have not heard from your caregiver or the medical facility. It is important for you to follow up on all of your test results.    SEEK IMMEDIATE MEDICAL ATTENTION IF:  You have more than a spotting of blood in your stool.   Your belly is swollen (abdominal distention).   You are nauseated or vomiting.   You have a temperature over 101.   You have abdominal pain or discomfort that is severe or gets worse throughout the day.   Your EGD showed a mild amount inflammation in your stomach.  I biopsied this to rule out infection with a bacteria called H. pylori.  He also have a large hiatal hernia.  I did not see any obvious signs of GI bleeding on your upper endoscopy.  Await pathology results, my office will contact you.  Your colonoscopy was largely unremarkable.  You have significant amount of diverticulosis as well as internal hemorrhoids.  I did not find any polyps today.  I recommend increasing fiber in your diet or adding over-the-counter Metamucil/Benefiber to decrease risk of complications from diverticulosis including diverticulitis and diverticular bleeding.  Be sure to drink at least 4 to 6 glasses of water daily..  I recommend we repeat colonoscopy in 5 years for surveillance purposes as well as borderline colon preparation.  Follow-up with [facility] in 3 months or as previously scheduled.  I hope you have a great rest of your week!  Hennie Duos. Marletta Lor, D.O. Gastroenterology and Hepatology [facility]

## 2020-03-14 NOTE — Anesthesia Preprocedure Evaluation (Signed)
Anesthesia Evaluation  Patient identified by MRN, date of birth, ID band Patient awake    Reviewed: Allergy & Precautions, H&P , NPO status , Patient's Chart, lab work & pertinent test results, reviewed documented beta blocker date and time   Airway Mallampati: II  TM Distance: >3 FB Neck ROM: full    Dental no notable dental hx.    Pulmonary COPD, former smoker,    Pulmonary exam normal breath sounds clear to auscultation       Cardiovascular Exercise Tolerance: Good hypertension, + CAD and + CABG   Rhythm:regular Rate:Normal     Neuro/Psych PSYCHIATRIC DISORDERS Anxiety negative neurological ROS     GI/Hepatic Neg liver ROS, hiatal hernia, GERD  Medicated,  Endo/Other  negative endocrine ROS  Renal/GU negative Renal ROS  negative genitourinary   Musculoskeletal   Abdominal   Peds  Hematology  (+) Blood dyscrasia, anemia ,   Anesthesia Other Findings   Reproductive/Obstetrics negative OB ROS                             Anesthesia Physical Anesthesia Plan  ASA: III  Anesthesia Plan: General   Post-op Pain Management:    Induction:   PONV Risk Score and Plan: Propofol infusion  Airway Management Planned:   Additional Equipment:   Intra-op Plan:   Post-operative Plan:   Informed Consent: I have reviewed the patients History and Physical, chart, labs and discussed the procedure including the risks, benefits and alternatives for the proposed anesthesia with the patient or authorized representative who has indicated his/her understanding and acceptance.     Dental Advisory Given  Plan Discussed with: CRNA  Anesthesia Plan Comments:         Anesthesia Quick Evaluation

## 2020-03-14 NOTE — Transfer of Care (Signed)
Immediate Anesthesia Transfer of Care Note  Patient: [name]  Procedure(s) Performed: COLONOSCOPY WITH PROPOFOL (N/A ) ESOPHAGOGASTRODUODENOSCOPY (EGD) WITH PROPOFOL (N/A ) BIOPSY  Patient Location: PACU  Anesthesia Type:MAC and General  Level of Consciousness: awake, alert , oriented and patient cooperative  Airway & Oxygen Therapy: Patient connected to nasal cannula oxygen  Post-op Assessment: Report given to RN and Post -op Vital signs reviewed and stable  Post vital signs: Reviewed and stable  Last Vitals:  Vitals Value Taken Time  BP 107/52 03/14/20 1120  Temp    Pulse 82 03/14/20 1124  Resp 18 03/14/20 1124  SpO2 98 % 03/14/20 1124  Vitals shown include unvalidated device data.  Last Pain:  Vitals:   03/14/20 1036  TempSrc:   PainSc: 0-No pain      Patients Stated Pain Goal: 8 (85/02/77 4128)  Complications: No complications documented.

## 2020-03-14 NOTE — Anesthesia Postprocedure Evaluation (Signed)
Anesthesia Post Note  Patient: [name]  Procedure(s) Performed: COLONOSCOPY WITH PROPOFOL (N/A ) ESOPHAGOGASTRODUODENOSCOPY (EGD) WITH PROPOFOL (N/A ) BIOPSY  Patient location during evaluation: PACU Anesthesia Type: General Level of consciousness: awake and alert Pain management: pain level controlled Vital Signs Assessment: post-procedure vital signs reviewed and stable Respiratory status: spontaneous breathing Cardiovascular status: stable Postop Assessment: no apparent nausea or vomiting Anesthetic complications: no   No complications documented.   Last Vitals:  Vitals:   03/14/20 1030 03/14/20 1120  BP:  (!) 107/52  Pulse:  84  Resp: 20 (!) 26  Temp:  (P) 36.5 C  SpO2:  97%    Last Pain:  Vitals:   03/14/20 1036  TempSrc:   PainSc: 0-No pain                 Edison Pace

## 2020-03-14 NOTE — Op Note (Signed)
[facility] Patient Name: [name] Procedure Date: 03/14/2020 10:48 AM MRN: [id number] Date of Birth: 1946/05/17 Attending MD: Elon Alas. Abbey Chatters DO CSN: [id number] Age: 74 Admit Type: Outpatient Procedure:                Colonoscopy Indications:              Iron deficiency anemia Providers:                Elon Alas. Abbey Chatters, DO, Lambert Mody, Janeece Riggers, RN, Teressa Senter Tech, Technician, Randa Spike, Merchant navy officer Referring MD:              Medicines:                See the Anesthesia note for documentation of the                            administered medications Complications:            No immediate complications. Estimated Blood Loss:     Estimated blood loss: none. Procedure:                Pre-Anesthesia Assessment:                           - The anesthesia plan was to use monitored                            anesthesia care (MAC).                           After obtaining informed consent, the colonoscope                            was passed under direct vision. Throughout the                            procedure, the patient's blood pressure, pulse, and                            oxygen saturations were monitored continuously. The                            PCF-H190DL ([id number]) scope was introduced through                            the anus and advanced to the the cecum, identified                            by appendiceal orifice and ileocecal valve. The                            colonoscopy was technically difficult and complex  due to multiple diverticula in the colon and bowel                            stenosis. Successful completion of the procedure                            was aided by withdrawing the scope and replacing                            with the ultra slim endoscope. The patient                            tolerated the procedure well. The quality of the                             bowel preparation was evaluated using the BBPS                            [facility] Bowel Preparation Scale) with scores of:                            Right Colon = 2 (minor amount of residual staining,                            small fragments of stool and/or opaque liquid, but                            mucosa seen well), Transverse Colon = 2 (minor                            amount of residual staining, small fragments of                            stool and/or opaque liquid, but mucosa seen well)                            and Left Colon = 2 (minor amount of residual                            staining, small fragments of stool and/or opaque                            liquid, but mucosa seen well). The total BBPS score                            equals 6. The quality of the bowel preparation was                            fair. Scope In: 10:48:51 AM Scope Out: 11:12:49 AM Scope Withdrawal Time: 0 hours 8 minutes 17 seconds  Total Procedure Duration: 0 hours 23 minutes 58 seconds  Findings:      The perianal and digital rectal examinations  were normal.      Non-bleeding internal hemorrhoids were found during endoscopy.      Many small and large-mouthed diverticula were found in the entire colon.      The colon (entire examined portion) revealed moderately excessive       looping with significant diverticulosis. Advancing the scope required       applying abdominal pressure. Advancing the scope required withdrawing       the scope and replacing with the ultra slim endoscope. Impression:               - Preparation of the colon was fair.                           - Non-bleeding internal hemorrhoids.                           - Diverticulosis in the entire examined colon.                           - There was significant looping of the colon.                           - No specimens collected. Moderate Sedation:      Per Anesthesia Care Recommendation:            - Patient has a contact number available for                            emergencies. The signs and symptoms of potential                            delayed complications were discussed with the                            patient. Return to normal activities tomorrow.                            Written discharge instructions were provided to the                            patient.                           - Resume previous diet.                           - Continue present medications.                           - Repeat colonoscopy in 5 years for surveillance                            and borderline colon prep. Procedure Code(s):        --- Professional ---                           [phone], Colonoscopy, flexible; diagnostic, including  collection of specimen(s) by brushing or washing,                            when performed (separate procedure) Diagnosis Code(s):        --- Professional ---                           K64.8, Other hemorrhoids                           D50.9, Iron deficiency anemia, unspecified                           K57.30, Diverticulosis of large intestine without                            perforation or abscess without bleeding CPT copyright 2019 American Medical Association. All rights reserved. The codes documented in this report are preliminary and upon coder review may  be revised to meet current compliance requirements. Elon Alas. Abbey Chatters, DO [place] Abbey Chatters, DO 03/14/2020 11:20:36 AM This report has been signed electronically. Number of Addenda: 0

## 2020-03-14 NOTE — Interval H&P Note (Signed)
History and Physical Interval Note:  03/14/2020 10:04 AM  [name]  has presented today for surgery, with the diagnosis of anemia, hx GI bleed.  The various methods of treatment have been discussed with the patient and family. After consideration of risks, benefits and other options for treatment, the patient has consented to  Procedure(s) with comments: COLONOSCOPY WITH PROPOFOL (N/A) - 1:00pm ESOPHAGOGASTRODUODENOSCOPY (EGD) WITH PROPOFOL (N/A) as a surgical intervention.  The patient's history has been reviewed, patient examined, no change in status, stable for surgery.  I have reviewed the patient's chart and labs.  Questions were answered to the patient's satisfaction.     Lanelle Bal

## 2020-03-14 NOTE — Op Note (Signed)
[facility] Patient Name: [name] Procedure Date: 03/14/2020 10:21 AM MRN: [id number] Date of Birth: 1946/05/24 Attending MD: Elon Alas. Abbey Chatters DO CSN: [id number] Age: 74 Admit Type: Outpatient Procedure:                Upper GI endoscopy Indications:              Iron deficiency anemia Providers:                Elon Alas. Abbey Chatters, DO, Jessica Boudreaux, Janeece Riggers, RN, Suzan Garibaldi. Risa Grill, Technician,                            Randa Spike, Merchant navy officer Referring MD:              Medicines:                See the Anesthesia note for documentation of the                            administered medications Complications:            No immediate complications. Estimated Blood Loss:     Estimated blood loss was minimal. Procedure:                Pre-Anesthesia Assessment:                           - The anesthesia plan was to use monitored                            anesthesia care (MAC).                           After obtaining informed consent, the endoscope was                            passed under direct vision. Throughout the                            procedure, the patient's blood pressure, pulse, and                            oxygen saturations were monitored continuously. The                            GIF-H190 ([id number]) scope was introduced through the                            mouth, and advanced to the second part of duodenum.                            The upper GI endoscopy was accomplished without                            difficulty. The patient tolerated  the procedure                            well. Scope In: 10:41:15 AM Scope Out: 10:44:01 AM Total Procedure Duration: 0 hours 2 minutes 46 seconds  Findings:      A large hiatal hernia was present.      Localized mild inflammation characterized by erythema was found in the       gastric antrum. Biopsies were taken with a cold forceps for Helicobacter       pylori  testing.      The duodenal bulb, first portion of the duodenum and second portion of       the duodenum were normal. Impression:               - Large hiatal hernia.                           - Gastritis. Biopsied.                           - Normal duodenal bulb, first portion of the                            duodenum and second portion of the duodenum. Moderate Sedation:      Per Anesthesia Care Recommendation:           - Patient has a contact number available for                            emergencies. The signs and symptoms of potential                            delayed complications were discussed with the                            patient. Return to normal activities tomorrow.                            Written discharge instructions were provided to the                            patient.                           - Resume previous diet.                           - Continue present medications.                           - Await pathology results.                           - Return to GI clinic in 3 months. Procedure Code(s):        --- Professional ---                           [phone], Esophagogastroduodenoscopy, flexible,  transoral; with biopsy, single or multiple Diagnosis Code(s):        --- Professional ---                           K44.9, Diaphragmatic hernia without obstruction or                            gangrene                           K29.70, Gastritis, unspecified, without bleeding                           D50.9, Iron deficiency anemia, unspecified CPT copyright 2019 American Medical Association. All rights reserved. The codes documented in this report are preliminary and upon coder review may  be revised to meet current compliance requirements. Elon Alas. Abbey Chatters, DO [place] Abbey Chatters, DO 03/14/2020 10:45:53 AM This report has been signed electronically. Number of Addenda: 0

## 2020-03-15 ENCOUNTER — Other Ambulatory Visit: Payer: Self-pay

## 2020-03-15 LAB — SURGICAL PATHOLOGY

## 2020-03-17 ENCOUNTER — Encounter (HOSPITAL_COMMUNITY): Payer: Self-pay | Admitting: Internal Medicine

## 2020-03-18 ENCOUNTER — Emergency Department (HOSPITAL_COMMUNITY)
Admission: EM | Admit: 2020-03-18 | Discharge: 2020-03-18 | Disposition: A | Discharge status: Home / Self Care | Payer: Medicare PPO | Attending: Emergency Medicine | Admitting: Emergency Medicine

## 2020-03-18 ENCOUNTER — Emergency Department (HOSPITAL_COMMUNITY): Payer: Medicare PPO

## 2020-03-18 ENCOUNTER — Encounter (HOSPITAL_COMMUNITY): Payer: Self-pay | Admitting: Emergency Medicine

## 2020-03-18 ENCOUNTER — Other Ambulatory Visit: Payer: Self-pay

## 2020-03-18 DIAGNOSIS — T783XXA Angioneurotic edema, initial encounter: Secondary | ICD-10-CM | POA: Diagnosis not present

## 2020-03-18 DIAGNOSIS — I10 Essential (primary) hypertension: Secondary | ICD-10-CM | POA: Insufficient documentation

## 2020-03-18 DIAGNOSIS — Z951 Presence of aortocoronary bypass graft: Secondary | ICD-10-CM | POA: Insufficient documentation

## 2020-03-18 DIAGNOSIS — I2510 Atherosclerotic heart disease of native coronary artery without angina pectoris: Secondary | ICD-10-CM | POA: Diagnosis not present

## 2020-03-18 DIAGNOSIS — Z79899 Other long term (current) drug therapy: Secondary | ICD-10-CM | POA: Insufficient documentation

## 2020-03-18 DIAGNOSIS — Z7982 Long term (current) use of aspirin: Secondary | ICD-10-CM | POA: Insufficient documentation

## 2020-03-18 DIAGNOSIS — Z955 Presence of coronary angioplasty implant and graft: Secondary | ICD-10-CM | POA: Diagnosis not present

## 2020-03-18 DIAGNOSIS — Z87891 Personal history of nicotine dependence: Secondary | ICD-10-CM | POA: Diagnosis not present

## 2020-03-18 DIAGNOSIS — R221 Localized swelling, mass and lump, neck: Secondary | ICD-10-CM | POA: Diagnosis not present

## 2020-03-18 DIAGNOSIS — J441 Chronic obstructive pulmonary disease with (acute) exacerbation: Secondary | ICD-10-CM | POA: Insufficient documentation

## 2020-03-18 DIAGNOSIS — Z981 Arthrodesis status: Secondary | ICD-10-CM | POA: Diagnosis not present

## 2020-03-18 DIAGNOSIS — Z0389 Encounter for observation for other suspected diseases and conditions ruled out: Secondary | ICD-10-CM | POA: Diagnosis not present

## 2020-03-18 DIAGNOSIS — J392 Other diseases of pharynx: Secondary | ICD-10-CM | POA: Diagnosis present

## 2020-03-18 LAB — CBC WITH DIFFERENTIAL/PLATELET
Abs Immature Granulocytes: 0.01 10*3/uL (ref 0.00–0.07)
Basophils Absolute: 0.1 10*3/uL (ref 0.0–0.1)
Basophils Relative: 1 %
Eosinophils Absolute: 0.3 10*3/uL (ref 0.0–0.5)
Eosinophils Relative: 5 %
HCT: 30.4 % — ABNORMAL LOW (ref 36.0–46.0)
Hemoglobin: 7.6 g/dL — ABNORMAL LOW (ref 12.0–15.0)
Immature Granulocytes: 0 %
Lymphocytes Relative: 23 %
Lymphs Abs: 1.6 10*3/uL (ref 0.7–4.0)
MCH: 16.1 pg — ABNORMAL LOW (ref 26.0–34.0)
MCHC: 25 g/dL — ABNORMAL LOW (ref 30.0–36.0)
MCV: 64.4 fL — ABNORMAL LOW (ref 80.0–100.0)
Monocytes Absolute: 0.5 10*3/uL (ref 0.1–1.0)
Monocytes Relative: 8 %
Neutro Abs: 4.5 10*3/uL (ref 1.7–7.7)
Neutrophils Relative %: 63 %
Platelets: 298 10*3/uL (ref 150–400)
RBC: 4.72 MIL/uL (ref 3.87–5.11)
RDW: 24.3 % — ABNORMAL HIGH (ref 11.5–15.5)
WBC: 7 10*3/uL (ref 4.0–10.5)
nRBC: 0 % (ref 0.0–0.2)

## 2020-03-18 LAB — COMPREHENSIVE METABOLIC PANEL
ALT: 12 U/L (ref 0–44)
AST: 14 U/L — ABNORMAL LOW (ref 15–41)
Albumin: 4 g/dL (ref 3.5–5.0)
Alkaline Phosphatase: 47 U/L (ref 38–126)
Anion gap: 9 (ref 5–15)
BUN: 15 mg/dL (ref 8–23)
CO2: 26 mmol/L (ref 22–32)
Calcium: 9.1 mg/dL (ref 8.9–10.3)
Chloride: 105 mmol/L (ref 98–111)
Creatinine, Ser: 1.05 mg/dL — ABNORMAL HIGH (ref 0.44–1.00)
GFR, Estimated: 55 mL/min — ABNORMAL LOW (ref >60)
Glucose, Bld: 97 mg/dL (ref 70–99)
Potassium: 4.2 mmol/L (ref 3.5–5.1)
Sodium: 140 mmol/L (ref 135–145)
Total Bilirubin: 0.7 mg/dL (ref 0.3–1.2)
Total Protein: 6.8 g/dL (ref 6.5–8.1)

## 2020-03-18 MED ORDER — PREDNISONE 20 MG PO TABS: 40.0000 mg | ORAL_TABLET | Freq: Every day | ORAL | 0 refills | Status: DC

## 2020-03-18 MED ORDER — PREDNISONE 20 MG PO TABS
40.0000 mg | ORAL_TABLET | Freq: Once | ORAL | Status: AC
  Administered 2020-03-18: 40 mg via ORAL
  Filled 2020-03-18: qty 2

## 2020-03-18 MED ORDER — DIPHENHYDRAMINE HCL 25 MG PO CAPS
25.0000 mg | ORAL_CAPSULE | Freq: Once | ORAL | Status: AC
  Administered 2020-03-18: 25 mg via ORAL
  Filled 2020-03-18: qty 1

## 2020-03-18 MED ORDER — PREDNISONE 20 MG PO TABS
40.0000 mg | ORAL_TABLET | Freq: Once | ORAL | Status: DC
  Administered 2020-03-18: 40 mg via ORAL
  Filled 2020-03-18: qty 2

## 2020-03-18 MED ORDER — FAMOTIDINE 20 MG PO TABS
20.0000 mg | ORAL_TABLET | Freq: Once | ORAL | Status: AC
  Administered 2020-03-18: 20 mg via ORAL
  Filled 2020-03-18: qty 1

## 2020-03-18 NOTE — ED Triage Notes (Signed)
Reports sore throat and feeling of swelling since endoscopy on Tuesday -"since they run that light down my throat"  Has contacted no provider  Here for eval

## 2020-03-18 NOTE — ED Notes (Signed)
Prednisone 40 mg only given once,

## 2020-03-18 NOTE — ED Provider Notes (Signed)
[facility] EMERGENCY DEPARTMENT Provider Note   CSN: [id number] Arrival date & time: 03/18/20  1024     History Chief Complaint  Patient presents with  . Sore Throat    since endoscopy    [name] is a 74 y.o. female.  HPI   This patient is a pleasant 74 year old female who reports several days after having an upper endoscopy as well as a colonoscopy after thought of GI bleed.  She was found to have diverticulosis without active bleeding and gastritis with a large hiatal hernia.  She states that she had a sore throat for couple of days but overnight has had some increasing swelling and discomfort in her throat like she has a piece of meat stuck there.  She has been eating things like chicken noodle soup and Jell-O and soft fish and does not recall getting anything caught there.  She has no difficulty breathing but states it is difficult to swallow.  She has no fevers or chills, no nausea or vomiting, no diarrhea, she has not seen any bleeding in her stools in the last couple of days.  Past Medical History:  Diagnosis Date  . Anxiety   . CAD (coronary artery disease)    Multivessel status post CABG 1998  . Cholelithiasis   . Chronic back pain   . Colitis, ischemic (HCC) 03/2011  . COPD (chronic obstructive pulmonary disease) (HCC)   . Diverticulosis   . Essential hypertension   . GERD (gastroesophageal reflux disease)   . GI bleed 03/2011  . Hiatal hernia   . Multiple pulmonary nodules 03/2011  . Nephrolithiasis     Patient Active Problem List   Diagnosis Date Noted  . Anemia 02/17/2020  . History of GI bleed 02/17/2020  . Sepsis (HCC) 06/07/2016  . Acute bronchitis 06/07/2016  . Essential hypertension 03/17/2014  . Hypokalemia 03/17/2014  . Volume overload 03/17/2014  . Dehydration 03/16/2014  . COPD (chronic obstructive pulmonary disease) (HCC) 03/15/2014  . Chest pain 03/15/2014  . Lower GI bleed 03/14/2014  . GIB (gastrointestinal bleeding) 03/14/2014  .  COPD with acute exacerbation (HCC) 06/17/2013  . Colitis 06/03/2011    Class: Hospitalized for    Past Surgical History:  Procedure Laterality Date  . ABDOMINAL HYSTERECTOMY     [facility]  . BACK SURGERY    . BIOPSY  03/14/2020   Procedure: BIOPSY;  Surgeon: Lanelle Bal, DO;  Location: [facility] ENDO SUITE;  Service: Endoscopy;;  . COLONOSCOPY  04/16/2011   Dr. Darrick Penna: moderate diverticulosis, small internal hemorrhoids, likely ischemic colitis  . COLONOSCOPY WITH PROPOFOL N/A 03/14/2020   Procedure: COLONOSCOPY WITH PROPOFOL;  Surgeon: Lanelle Bal, DO;  Location: [facility] ENDO SUITE;  Service: Endoscopy;  Laterality: N/A;  1:00pm  . CORONARY ARTERY BYPASS GRAFT  1998  . CORONARY STENT PLACEMENT  1995  . ESOPHAGOGASTRODUODENOSCOPY N/A 06/23/2013   Dr. Jena Gauss: cervical esophageal web likely s/p dilation with scope, non-critical Schatzki's ring, large hiatal hernia with Sheria Lang lesions, chronic active gastritis  . ESOPHAGOGASTRODUODENOSCOPY (EGD) WITH PROPOFOL N/A 03/14/2020   Procedure: ESOPHAGOGASTRODUODENOSCOPY (EGD) WITH PROPOFOL;  Surgeon: Lanelle Bal, DO;  Location: [facility];  Service: Endoscopy;  Laterality: N/A;  . SALPINGOOPHORECTOMY Left   . SMALL INTESTINE SURGERY    . SPINAL FUSION     x3     OB History   No obstetric history on file.     Family History  Problem Relation Age of Onset  . Stomach cancer Mother 38  .  Breast cancer Sister 52  . Diabetes Mellitus II Sister   . Colon cancer Neg Hx   . Esophageal cancer Neg Hx     Social History   Tobacco Use  . Smoking status: Former Smoker    Packs/day: 1.00    Years: 40.00    Pack years: 40.00    Types: Cigarettes    Quit date: 09/14/2010    Years since quitting: 9.5  . Smokeless tobacco: Never Used  Substance Use Topics  . Alcohol use: No  . Drug use: No    Comment: marijuana (QUIT couple yrs ago)    Home Medications Prior to Admission medications   Medication Sig Start Date End Date Taking?  Authorizing Provider  aspirin EC 81 MG tablet Take 81 mg by mouth daily. Swallow whole.    [provider]  atorvastatin (LIPITOR) 40 MG tablet Take 40 mg by mouth at bedtime.  04/24/13   [provider]  cholecalciferol (VITAMIN D) 1000 UNITS tablet Take 1,000 Units by mouth daily.      [provider]  Ferrous Sulfate Dried (FERROUS SULFATE CR PO) Take 150 mg by mouth at bedtime.     [provider]  loratadine (CLARITIN) 10 MG tablet Take 10 mg by mouth daily.    [provider]  metoprolol succinate (TOPROL-XL) 25 MG 24 hr tablet Take 25 mg by mouth 2 (two) times daily.     [provider]  pantoprazole (PROTONIX) 40 MG tablet Take 1 tablet (40 mg total) by mouth 2 (two) times daily. Patient taking differently: Take 40 mg by mouth daily.  06/24/13   Avon Gully, MD  predniSONE (DELTASONE) 20 MG tablet Take 2 tablets (40 mg total) by mouth daily. 03/18/20   Eber Hong, MD  lisinopril (PRINIVIL,ZESTRIL) 10 MG tablet Take 10 mg by mouth daily.    03/18/20  [provider]    Allergies    Omnipaque [iohexol] and Penicillins  Review of Systems   Review of Systems  All other systems reviewed and are negative.   Physical Exam Updated Vital Signs BP (!) 161/76   Pulse (!) 55   Temp 99.2 F (37.3 C) (Oral)   Resp 18   Ht 1.6 m (5\' 3" )   Wt 81.6 kg   SpO2 98%   BMI 31.89 kg/m   Physical Exam Vitals and nursing note reviewed.  Constitutional:      General: She is not in acute distress.    Appearance: She is well-developed.  HENT:     Head: Normocephalic and atraumatic.     Mouth/Throat:     Pharynx: No oropharyngeal exudate.     Comments: Slight edema surrounding the uvula but oropharynx is clear and moist, phonation is normal, supple neck, no trismus or torticollis Eyes:     General: No scleral icterus.       Right eye: No discharge.        Left eye: No discharge.     Conjunctiva/sclera: Conjunctivae normal.      Pupils: Pupils are equal, round, and reactive to light.  Neck:     Thyroid: No thyromegaly.     Vascular: No JVD.  Cardiovascular:     Rate and Rhythm: Normal rate and regular rhythm.     Heart sounds: Normal heart sounds. No murmur heard.  No friction rub. No gallop.   Pulmonary:     Effort: Pulmonary effort is normal. No respiratory distress.     Breath sounds: Normal  breath sounds. No wheezing or rales.  Abdominal:     General: Bowel sounds are normal. There is no distension.     Palpations: Abdomen is soft. There is no mass.     Tenderness: There is no abdominal tenderness.  Musculoskeletal:        General: No tenderness. Normal range of motion.     Cervical back: Normal range of motion and neck supple.  Lymphadenopathy:     Cervical: No cervical adenopathy.  Skin:    General: Skin is warm and dry.     Findings: No erythema or rash.  Neurological:     Mental Status: She is alert.     Coordination: Coordination normal.  Psychiatric:        Behavior: Behavior normal.     ED Results / Procedures / Treatments   Labs (all labs ordered are listed, but only abnormal results are displayed) Labs Reviewed  CBC WITH DIFFERENTIAL/PLATELET - Abnormal; Notable for the following components:      Result Value   Hemoglobin 7.6 (*)    HCT 30.4 (*)    MCV 64.4 (*)    MCH 16.1 (*)    MCHC 25.0 (*)    RDW 24.3 (*)    All other components within normal limits  COMPREHENSIVE METABOLIC PANEL - Abnormal; Notable for the following components:   Creatinine, Ser 1.05 (*)    AST 14 (*)    GFR, Estimated 55 (*)    All other components within normal limits    EKG None  Radiology DG Neck Soft Tissue  Result Date: 03/18/2020 CLINICAL DATA:  Possible foreign body. EXAM: NECK SOFT TISSUES - 1+ VIEW COMPARISON:  None. FINDINGS: There is no evidence of retropharyngeal soft tissue swelling or epiglottic enlargement. The cervical airway is unremarkable and no radio-opaque foreign body  identified. IMPRESSION: Negative. Electronically Signed   By: Lupita Raider M.D.   On: 03/18/2020 12:50   CT Soft Tissue Neck Wo Contrast  Result Date: 03/18/2020 CLINICAL DATA:  Throat swelling after endoscopy 2 days ago. Foreign body suspected. EXAM: CT NECK WITHOUT CONTRAST TECHNIQUE: Multidetector CT imaging of the neck was performed following the standard protocol without intravenous contrast. COMPARISON:  Radiography from earlier today FINDINGS: Pharynx and larynx: No convincing submucosal edema. No hematoma or air leak. No opaque foreign body Salivary glands: No inflammation, mass, or stone. Thyroid: Unremarkable Lymph nodes: None enlarged or abnormal density. Vascular: Scattered atheromatous calcifications. Limited intracranial: Negative Visualized orbits: Negative Mastoids and visualized paranasal sinuses: Clear Skeleton: C4-5 ACDF with solid arthrodesis. Adjacent disc collapse and degenerative endplate spurring. Upper chest: Negative IMPRESSION: No evidence of airway injury or narrowing. Electronically Signed   By: Marnee Spring M.D.   On: 03/18/2020 14:49    Procedures Procedures (including critical care time)  Medications Ordered in ED Medications  diphenhydrAMINE (BENADRYL) capsule 25 mg (25 mg Oral Given 03/18/20 1218)  famotidine (PEPCID) tablet 20 mg (20 mg Oral Given 03/18/20 1219)  predniSONE (DELTASONE) tablet 40 mg (40 mg Oral Given 03/18/20 1219)    ED Course  I have reviewed the triage vital signs and the nursing notes.  Pertinent labs & imaging results that were available during my care of the patient were reviewed by me and considered in my medical decision making (see chart for details).    MDM Rules/Calculators/A&P                          I  have reviewed the patient's labs, she is chronically anemic, she does not appear tachycardic or hypotensive, I will do an x-ray of the neck to make sure there is no fishbone stuck however the patient does appear to have a  little bit of edema around the back of her throat.  She is in no distress and tolerating her airway and secretions just fine.  On review of her medications it does appear that she takes lisinopril, it may be at some worth to discontinue this with the presence of the edema and to watch the patient after giving her antihistamines and some prednisone.  Read reevaluated the patient at 130, she has no clinical improvement but no worsening either.  The gastroenterologist Dr. Jena Gaussourk suggest that CT scan would be helpful to make sure there is not a complication from endoscopy or mechanical trauma, also suggest that lisinopril may be causing angioedema which was triggered by the mechanical trauma.  Will stop lisinopril, obtain CT scan of the neck and if okay the patient may be discharged.  The patient is agreeable to the plan  CT scan negative for acute findings, patient will be continued on 5 days of prednisone antihistamines and will stop ACE inhibitor, she is agreeable and understanding to the reasons for return.  She is tolerating secretions without difficulty, she has normal oxygen levels and is in no respiratory distress.  I have seen no progression of this edema since arrival, it is minimal and trace and she is stable for discharge  Final Clinical Impression(s) / ED Diagnoses Final diagnoses:  Angioedema, initial encounter    Rx / DC Orders ED Discharge Orders         Ordered    predniSONE (DELTASONE) 20 MG tablet  Daily        03/18/20 1512           Eber HongMiller, Rihaan Barrack, MD 03/18/20 1514

## 2020-03-18 NOTE — ED Notes (Signed)
Advised she would need to find a ride home. Began to speak louder at that time.

## 2020-03-18 NOTE — Discharge Instructions (Signed)
Please call your doctors office on Monday to discuss alternatives to your blood pressure medication.  Your blood pressure medication called lisinopril is likely causing some swelling, this is causing you to feel like you have a piece of food stuck in your throat.  The CT scan shows that you do not have any food or other foreign bodies in your throat.  Please take the following medications  Prednisone daily for 5 days  Benadryl every 6 hours as needed for itching or swelling, maximum dose 25 mg  Pepcid 20 mg daily for the next 7 days  Seek medical exam for severe or worsening symptoms including any progression or worsening of the swelling or difficulty breathing  You are not to ever take lisinopril again

## 2020-03-22 DIAGNOSIS — I10 Essential (primary) hypertension: Secondary | ICD-10-CM | POA: Diagnosis not present

## 2020-03-22 DIAGNOSIS — I2510 Atherosclerotic heart disease of native coronary artery without angina pectoris: Secondary | ICD-10-CM | POA: Diagnosis not present

## 2020-04-22 DIAGNOSIS — I10 Essential (primary) hypertension: Secondary | ICD-10-CM | POA: Diagnosis not present

## 2020-04-22 DIAGNOSIS — K219 Gastro-esophageal reflux disease without esophagitis: Secondary | ICD-10-CM | POA: Diagnosis not present

## 2020-05-24 ENCOUNTER — Ambulatory Visit (INDEPENDENT_AMBULATORY_CARE_PROVIDER_SITE_OTHER): Payer: Medicare PPO | Admitting: Nurse Practitioner

## 2020-05-24 ENCOUNTER — Telehealth: Payer: Self-pay | Admitting: *Deleted

## 2020-05-24 ENCOUNTER — Other Ambulatory Visit: Payer: Self-pay

## 2020-05-24 ENCOUNTER — Telehealth: Payer: Self-pay

## 2020-05-24 ENCOUNTER — Encounter: Payer: Self-pay | Admitting: Nurse Practitioner

## 2020-05-24 ENCOUNTER — Other Ambulatory Visit (HOSPITAL_COMMUNITY)
Admission: RE | Admit: 2020-05-24 | Discharge: 2020-05-24 | Disposition: A | Discharge status: Home / Self Care | Payer: Medicare PPO | Attending: Nurse Practitioner | Admitting: Nurse Practitioner

## 2020-05-24 VITALS — BP 173/75 | HR 53 | Temp 96.6°F | Ht 63.0 in | Wt 183.6 lb

## 2020-05-24 DIAGNOSIS — K219 Gastro-esophageal reflux disease without esophagitis: Secondary | ICD-10-CM | POA: Diagnosis not present

## 2020-05-24 DIAGNOSIS — T783XXS Angioneurotic edema, sequela: Secondary | ICD-10-CM

## 2020-05-24 DIAGNOSIS — D649 Anemia, unspecified: Secondary | ICD-10-CM

## 2020-05-24 DIAGNOSIS — I10 Essential (primary) hypertension: Secondary | ICD-10-CM | POA: Diagnosis not present

## 2020-05-24 DIAGNOSIS — E785 Hyperlipidemia, unspecified: Secondary | ICD-10-CM | POA: Diagnosis not present

## 2020-05-24 DIAGNOSIS — I2510 Atherosclerotic heart disease of native coronary artery without angina pectoris: Secondary | ICD-10-CM | POA: Diagnosis not present

## 2020-05-24 DIAGNOSIS — T783XXA Angioneurotic edema, initial encounter: Secondary | ICD-10-CM | POA: Insufficient documentation

## 2020-05-24 LAB — CBC WITH DIFFERENTIAL/PLATELET
Abs Immature Granulocytes: 0.02 10*3/uL (ref 0.00–0.07)
Basophils Absolute: 0.1 10*3/uL (ref 0.0–0.1)
Basophils Relative: 1 %
Eosinophils Absolute: 0.4 10*3/uL (ref 0.0–0.5)
Eosinophils Relative: 5 %
HCT: 37.3 % (ref 36.0–46.0)
Hemoglobin: 9.7 g/dL — ABNORMAL LOW (ref 12.0–15.0)
Immature Granulocytes: 0 %
Lymphocytes Relative: 25 %
Lymphs Abs: 1.9 10*3/uL (ref 0.7–4.0)
MCH: 17.3 pg — ABNORMAL LOW (ref 26.0–34.0)
MCHC: 26 g/dL — ABNORMAL LOW (ref 30.0–36.0)
MCV: 66.6 fL — ABNORMAL LOW (ref 80.0–100.0)
Monocytes Absolute: 0.5 10*3/uL (ref 0.1–1.0)
Monocytes Relative: 7 %
Neutro Abs: 4.7 10*3/uL (ref 1.7–7.7)
Neutrophils Relative %: 62 %
Platelets: 377 10*3/uL (ref 150–400)
RBC: 5.6 MIL/uL — ABNORMAL HIGH (ref 3.87–5.11)
RDW: 22.4 % — ABNORMAL HIGH (ref 11.5–15.5)
WBC: 7.5 10*3/uL (ref 4.0–10.5)
nRBC: 0 % (ref 0.0–0.2)

## 2020-05-24 NOTE — Patient Instructions (Signed)
Your health issues we discussed today were:   Anemia (likely blood loss): 1. As we discussed, your colonoscopy and upper endoscopy looked relatively normal 2. At this point we will need to proceed with a "Givens capsule endoscopy" to make sure you are not bleeding in the middle part of your small intestines that we cannot reach with the scope 3. This is completed by swallowing a pill with a camera in it and carrying a little battery pack on your waist for about 8 hours 4. You will bring the battery pack back to endoscopy unit at [facility] the next morning and you will naturally pass the camera pill in your stool 5. Further recommendations will follow 6. Let us know if you see any obvious bleeding 7. Continue taking iron as you have been  Overall I recommend:  1. Continue other current medications 2. Return for follow-up in 3 months 3. Call us for any questions or concerns   ---------------------------------------------------------------  I am glad you have gotten your COVID-19 vaccination!  Even though you are fully vaccinated you should continue to follow CDC and state/local guidelines.  ---------------------------------------------------------------   At [facility] we value your feedback. You may receive a survey about your visit today. Please share your experience as we strive to create trusting relationships with our patients to provide genuine, compassionate, quality care.  We appreciate your understanding and patience as we review any laboratory studies, imaging, and other diagnostic tests that are ordered as we care for you. Our office policy is 5 business days for review of these results, and any emergent or urgent results are addressed in a timely manner for your best interest. If you do not hear from our office in 1 week, please contact us.   We also encourage the use of MyChart, which contains your medical information for your review as well. If you  are not enrolled in this feature, an access code is on this after visit summary for your convenience. Thank you for allowing [place] to be involved in your care.  It was great to see you today!  I hope you have a Happy New Year!!

## 2020-05-24 NOTE — Telephone Encounter (Signed)
Pt was seen in office today 05/24/20. Spoke with Karel Jarvis at Dr. Vista Deck office per Wynne Dust, NP. She is going to inform Dr. Felecia Shelling about the Angioedema, elevated BP and possible allergy to Lisinopril (neck/throat swelling) and they will contact pt. Pt was advised not to take Lisinopril until speaking with Dr. Felecia Shelling.

## 2020-05-24 NOTE — Telephone Encounter (Signed)
PA submitted via availity for GIVENS capsule study. Clinicals uploaded. Will await approval/denial.

## 2020-05-24 NOTE — Progress Notes (Signed)
Referring Provider: Avon Gully, MD Primary Care Physician:  Avon Gully, MD Primary GI:  Dr. Marletta Lor  Chief Complaint  Patient presents with  . Anemia    HPI:   [name] is a 74 y.o. female who presents for follow-up on anemia and history of GI bleed.  The patient was last seen in our office 02/17/2020 for the same.  Noted previous episode of colitis in 2015 and subsequent GI bleed.  EGD at that time with cervical esophageal web, large hiatal hernia with Cameron's lesions, chronic active gastritis.  She was treated with Cipro and Flagyl for likely infectious colitis, although ischemic colitis in the differentials as well.  Stool studies were never completed.  Last colonoscopy 2012 with rectal bleeding secondary to ischemic colitis that is resolving, diverticulosis, internal hemorrhoids.  Most recent CBC prior to her last visit was June 2021 with a hemoglobin of 7.9 it was microcytic and hypochromic.  At her last visit noted last colonoscopy recommended 5-year repeat but this has not been completed.  No overt GI complaints at that time.  Recommended CBC, iron, ferritin.  Colonoscopy and upper endoscopy to evaluate anemia, ER precautions given, follow-up in 3 months.  Labs are completed 02/18/2020.  CBC found stable but persistently low hemoglobin at 7.7 still microcytic and hypochromic, iron low at 22, percent saturation low at 5%, ferritin low at 3.  Colonoscopy was completed 03/14/2020 which found nonbleeding internal hemorrhoids, extensive pancolonic diverticulosis, fair preparation, no specimens collected.  Recommended repeat colonoscopy in 5 years due to marginal prep.    EGD completed the same day found large hiatal hernia, gastritis status post biopsy, normal duodenum.  Surgical pathology found the biopsies to be benign gastric mucosa without H. pylori.  Recommended 21-month follow-up and consider capsule endoscopy at that time.  Most recent CBC completed 03/18/2020 stable but  persistently low hemoglobin at 7.6 that is still microcytic and hypochromic.  Platelet count normal.  Of note the patient was seen in the emergency department 03/18/2020 for angioedema.  She presented complaining of sore throat with increasing swelling and discomfort in her throat as well as a globus sensation.  No difficulty breathing.  Is having difficulty swallowing.  Physical exam noted edema surrounding the uvula but oropharynx clear and moist with normal phonation and no trismus or torticollis.  She was given antihistamines and prednisone.  CT of the neck was negative for acute findings due to recent EGD.  She was recommended to continue 5 days of prednisone and antihistamines, stop ACE inhibitor due to likely side effect of angioedema.  Discharged in satisfactory condition.  Today she states is doing okay overall. She states the throat swelling improved during the ER visit after steroids and antihistamines, and resolved with subsequent home Rx steroids and antihistamines. States she was told by her PCP to restart ACE-I. Denies hematochezia. Has dark stools on iron. Denies ongoing abdominal pain, N/V, fever, chills, unintentional weight loss. Has chronic back pain. Denies URI or flu-like symptoms. Denies loss of sense of taste or smell.  The patient has received COVID-19 vaccination(s). Denies chest pain, dyspnea, dizziness, lightheadedness, syncope, near syncope. Denies any other upper or lower GI symptoms.  Past Medical History:  Diagnosis Date  . Anxiety   . CAD (coronary artery disease)    Multivessel status post CABG 1998  . Cholelithiasis   . Chronic back pain   . Colitis, ischemic (HCC) 03/2011  . COPD (chronic obstructive pulmonary disease) (HCC)   . Diverticulosis   .  Essential hypertension   . GERD (gastroesophageal reflux disease)   . GI bleed 03/2011  . Hiatal hernia   . Multiple pulmonary nodules 03/2011  . Nephrolithiasis     Past Surgical History:  Procedure Laterality  Date  . ABDOMINAL HYSTERECTOMY     LSO  . BACK SURGERY    . BIOPSY  03/14/2020   Procedure: BIOPSY;  Surgeon: Lanelle Bal, DO;  [facility];  Service: Endoscopy;;  . COLONOSCOPY  04/16/2011   Dr. Darrick Penna: moderate diverticulosis, small internal hemorrhoids, likely ischemic colitis  . COLONOSCOPY WITH PROPOFOL N/A 03/14/2020   Procedure: COLONOSCOPY WITH PROPOFOL;  Surgeon: Lanelle Bal, DO;  [facility];  Service: Endoscopy;  Laterality: N/A;  1:00pm  . CORONARY ARTERY BYPASS GRAFT  1998  . CORONARY STENT PLACEMENT  1995  . ESOPHAGOGASTRODUODENOSCOPY N/A 06/23/2013   Dr. Jena Gauss: cervical esophageal web likely s/p dilation with scope, non-critical Schatzki's ring, large hiatal hernia with Sheria Lang lesions, chronic active gastritis  . ESOPHAGOGASTRODUODENOSCOPY (EGD) WITH PROPOFOL N/A 03/14/2020   Procedure: ESOPHAGOGASTRODUODENOSCOPY (EGD) WITH PROPOFOL;  Surgeon: Lanelle Bal, DO;  [facility];  Service: Endoscopy;  Laterality: N/A;  . SALPINGOOPHORECTOMY Left   . SMALL INTESTINE SURGERY    . SPINAL FUSION     x3    Current Outpatient Medications  Medication Sig Dispense Refill  . aspirin EC 81 MG tablet Take 81 mg by mouth daily. Swallow whole.    Marland Kitchen atorvastatin (LIPITOR) 40 MG tablet Take 40 mg by mouth at bedtime.     . cholecalciferol (VITAMIN D) 1000 UNITS tablet Take 1,000 Units by mouth daily.    . Ferrous Sulfate Dried (FERROUS SULFATE CR PO) Take 150 mg by mouth at bedtime.     Marland Kitchen loratadine (CLARITIN) 10 MG tablet Take 10 mg by mouth daily.    . metoprolol succinate (TOPROL-XL) 25 MG 24 hr tablet Take 25 mg by mouth 2 (two) times daily.    . pantoprazole (PROTONIX) 40 MG tablet Take 1 tablet (40 mg total) by mouth 2 (two) times daily. (Patient taking differently: Take 40 mg by mouth daily.) 60 tablet 3  . predniSONE (DELTASONE) 20 MG tablet Take 2 tablets (40 mg total) by mouth daily. (Patient not taking: Reported on 05/24/2020)  10 tablet 0   No current facility-administered medications for this visit.    Allergies as of 05/24/2020 - Review Complete 05/24/2020  Allergen Reaction Noted  . Lisinopril Swelling 03/18/2020  . Omnipaque [iohexol] Other (See Comments) 09/19/2010  . Penicillins Other (See Comments) 09/19/2010    Family History  Problem Relation Age of Onset  . Stomach cancer Mother 36  . Breast cancer Sister 55  . Diabetes Mellitus II Sister   . Colon cancer Neg Hx   . Esophageal cancer Neg Hx     Social History   Socioeconomic History  . Marital status: Divorced    Spouse name: Not on file  . Number of children: 0  . Years of education: Not on file  . Highest education level: Not on file  Occupational History  . Occupation: disabled    Associate Professor: retired  Tobacco Use  . Smoking status: Former Smoker    Packs/day: 1.00    Years: 40.00    Pack years: 40.00    Types: Cigarettes    Quit date: 09/14/2010    Years since quitting: 9.6  . Smokeless tobacco: Never Used  Substance and Sexual Activity  . Alcohol use:  No  . Drug use: No    Comment: marijuana (QUIT couple yrs ago)  . Sexual activity: Not Currently  Other Topics Concern  . Not on file  Social History Narrative   Lives alone   Social Determinants of Health   Financial Resource Strain: Not on file  Food Insecurity: Not on file  Transportation Needs: Not on file  Physical Activity: Not on file  Stress: Not on file  Social Connections: Not on file    Subjective: Review of Systems  Constitutional: Negative for chills, fever, malaise/fatigue and weight loss.  HENT: Negative for congestion and sore throat.   Respiratory: Negative for cough and shortness of breath.   Cardiovascular: Negative for chest pain and palpitations.  Gastrointestinal: Negative for abdominal pain, blood in stool, diarrhea, melena, nausea and vomiting.  Musculoskeletal: Negative for joint pain and myalgias.  Skin: Negative for rash.   Neurological: Negative for dizziness and weakness.  Endo/Heme/Allergies: Does not bruise/bleed easily.  Psychiatric/Behavioral: Negative for depression. The patient is not nervous/anxious.   All other systems reviewed and are negative.    Objective: BP (!) 173/75   Pulse (!) 53   Temp (!) 96.6 F (35.9 C) (Temporal)   Ht 5\' 3"  (1.6 m)   Wt 183 lb 9.6 oz (83.3 kg)   BMI 32.52 kg/m  Physical Exam Vitals and nursing note reviewed.  Constitutional:      General: She is not in acute distress.    Appearance: Normal appearance. She is well-developed. She is obese. She is not ill-appearing, toxic-appearing or diaphoretic.  HENT:     Head: Normocephalic and atraumatic.     Nose: No congestion or rhinorrhea.  Eyes:     General: No scleral icterus. Cardiovascular:     Rate and Rhythm: Normal rate and regular rhythm.     Heart sounds: Normal heart sounds.  Pulmonary:     Effort: Pulmonary effort is normal. No respiratory distress.     Breath sounds: Normal breath sounds.  Abdominal:     General: Bowel sounds are normal.     Palpations: Abdomen is soft. There is no hepatomegaly, splenomegaly or mass.     Tenderness: There is no abdominal tenderness. There is no guarding or rebound.     Hernia: No hernia is present.  Skin:    General: Skin is warm and dry.     Coloration: Skin is not jaundiced.     Findings: No rash.  Neurological:     General: No focal deficit present.     Mental Status: She is alert and oriented to person, place, and time.  Psychiatric:        Attention and Perception: Attention normal.        Mood and Affect: Mood normal.        Speech: Speech normal.        Behavior: Behavior normal.        Thought Content: Thought content normal.        Cognition and Memory: Cognition and memory normal.      Assessment:  Very pleasant 74 year old female who presents to follow-up on iron deficiency anemia.  Low iron studies and persistently low, but stable hemoglobin  as noted in HPI.  Colonoscopy and EGD unremarkable.  No obvious bleeding.  Further work-up as per below.  Iron deficiency anemia: Hemoglobin has been stable around 7.5 for the past 6 months.  She has not had a CBC since October.  Iron studies low including ferritin of three.  No obvious GI bleeding.  Colonoscopy and endoscopy unremarkable as described in HPI.  At this point given her IDA we will proceed with capsule study to complete evaluation for GI bleed.  No history of previous bowel obstruction, no sclerosing disease, no obvious need for patency capsule prior to capsule endoscopy.  Of note she is on iron and recommend she continue this for now other than holding 1 week prior to capsule study.  Pending results the patient would likely benefit from referral to hematology.  Further recommendations to follow.   Angioedema: The patient was in the emergency department for throat swelling and globus sensation/difficulty swallowing about 5 days after her EGD.  A CT of the neck tissues was completed to ensure no injury or damage from her EGD and was found to be unremarkable.  She had improvement after administration of steroids and antihistamines and subsequently resolved on home prednisone pack and antihistamines.  She was diagnosed with angioedema likely due to ACE inhibitor and recommended she stop lisinopril.  She states she saw her primary care today who recommended she restart lisinopril.  We will have our nurse reach out to primary care, per the patient request, to discuss other BP options due to elevated blood pressure today (173/75) and high risk to rechallenge with lisinopril.   Plan: 1. Contact primary care to ask about other blood pressure management options 2. Givens capsule endoscopy 3. CBC 4. Follow-up in 3 months    Thank you for allowing [place] to participate in the care of [facility]  Wynne Dust, DNP, AGNP-C Adult & Gerontological Nurse Practitioner [facility]   05/24/2020 2:16 PM   Disclaimer: This note was dictated with voice recognition software. Similar sounding words can inadvertently be transcribed and may not be corrected upon review.

## 2020-05-24 NOTE — Progress Notes (Signed)
CC'ED TO PCP 

## 2020-05-24 NOTE — Telephone Encounter (Signed)
Noted, thank you for the assistance.

## 2020-05-25 NOTE — Telephone Encounter (Signed)
PA approved for givens capsule. Auth# [id number] DOS 05/29/2020-07/24/2020   Called patient. She has been scheduled for 1/31 at 7:30am, arrival 7am. Aware [facility] Short Stay. Advised will need to stop iron 1 week prior. Also confirmed mailing address. Instructions mailed.

## 2020-06-24 DIAGNOSIS — K219 Gastro-esophageal reflux disease without esophagitis: Secondary | ICD-10-CM | POA: Diagnosis not present

## 2020-06-24 DIAGNOSIS — I10 Essential (primary) hypertension: Secondary | ICD-10-CM | POA: Diagnosis not present

## 2020-06-26 ENCOUNTER — Ambulatory Visit (HOSPITAL_COMMUNITY)
Admission: RE | Admit: 2020-06-26 | Discharge: 2020-06-26 | Disposition: A | Discharge status: Home / Self Care | Payer: Medicare PPO | Attending: Internal Medicine | Admitting: Internal Medicine

## 2020-06-26 ENCOUNTER — Encounter (HOSPITAL_COMMUNITY)
Admission: RE | Disposition: A | Discharge status: Home / Self Care | Payer: Self-pay | Source: Home / Self Care | Attending: Internal Medicine

## 2020-06-26 DIAGNOSIS — Z91041 Radiographic dye allergy status: Secondary | ICD-10-CM | POA: Insufficient documentation

## 2020-06-26 DIAGNOSIS — Z880 Allergy status to penicillin: Secondary | ICD-10-CM | POA: Insufficient documentation

## 2020-06-26 DIAGNOSIS — D500 Iron deficiency anemia secondary to blood loss (chronic): Secondary | ICD-10-CM

## 2020-06-26 DIAGNOSIS — Z79899 Other long term (current) drug therapy: Secondary | ICD-10-CM | POA: Diagnosis not present

## 2020-06-26 DIAGNOSIS — D509 Iron deficiency anemia, unspecified: Secondary | ICD-10-CM | POA: Insufficient documentation

## 2020-06-26 DIAGNOSIS — Z888 Allergy status to other drugs, medicaments and biological substances status: Secondary | ICD-10-CM | POA: Diagnosis not present

## 2020-06-26 DIAGNOSIS — D649 Anemia, unspecified: Secondary | ICD-10-CM | POA: Diagnosis present

## 2020-06-26 DIAGNOSIS — Z87891 Personal history of nicotine dependence: Secondary | ICD-10-CM | POA: Insufficient documentation

## 2020-06-26 DIAGNOSIS — Z7982 Long term (current) use of aspirin: Secondary | ICD-10-CM | POA: Insufficient documentation

## 2020-06-26 HISTORY — PX: GIVENS CAPSULE STUDY: SHX5432

## 2020-06-26 SURGERY — IMAGING PROCEDURE, GI TRACT, INTRALUMINAL, VIA CAPSULE
Anesthesia: Monitor Anesthesia Care

## 2020-06-26 NOTE — H&P (Signed)
Primary Care Physician:  Avon Gully, MD Primary Gastroenterologist:  Dr. Marletta Lor  Pre-Procedure History & Physical: HPI:  [name] is a 75 y.o. female is here for a GIVENs capsule for anemia. Hemoglobin has been stable around 7.5 for the past 6 months.  She has not had a CBC since October.  Iron studies low including ferritin of three.  No obvious GI bleeding.  Colonoscopy and endoscopy unremarkable   Past Medical History:  Diagnosis Date  . Anxiety   . CAD (coronary artery disease)    Multivessel status post CABG 1998  . Cholelithiasis   . Chronic back pain   . Colitis, ischemic (HCC) 03/2011  . COPD (chronic obstructive pulmonary disease) (HCC)   . Diverticulosis   . Essential hypertension   . GERD (gastroesophageal reflux disease)   . GI bleed 03/2011  . Hiatal hernia   . Multiple pulmonary nodules 03/2011  . Nephrolithiasis     Past Surgical History:  Procedure Laterality Date  . ABDOMINAL HYSTERECTOMY     LSO  . BACK SURGERY    . BIOPSY  03/14/2020   Procedure: BIOPSY;  Surgeon: Lanelle Bal, DO;  [facility];  Service: Endoscopy;;  . COLONOSCOPY  04/16/2011   Dr. Darrick Penna: moderate diverticulosis, small internal hemorrhoids, likely ischemic colitis  . COLONOSCOPY WITH PROPOFOL N/A 03/14/2020   Procedure: COLONOSCOPY WITH PROPOFOL;  Surgeon: Lanelle Bal, DO;  [facility];  Service: Endoscopy;  Laterality: N/A;  1:00pm  . CORONARY ARTERY BYPASS GRAFT  1998  . CORONARY STENT PLACEMENT  1995  . ESOPHAGOGASTRODUODENOSCOPY N/A 06/23/2013   Dr. Jena Gauss: cervical esophageal web likely s/p dilation with scope, non-critical Schatzki's ring, large hiatal hernia with Sheria Lang lesions, chronic active gastritis  . ESOPHAGOGASTRODUODENOSCOPY (EGD) WITH PROPOFOL N/A 03/14/2020   Procedure: ESOPHAGOGASTRODUODENOSCOPY (EGD) WITH PROPOFOL;  Surgeon: Lanelle Bal, DO;  [facility];  Service: Endoscopy;  Laterality: N/A;  .  SALPINGOOPHORECTOMY Left   . SMALL INTESTINE SURGERY    . SPINAL FUSION     x3    Prior to Admission medications   Medication Sig Start Date End Date Taking? Authorizing Provider  aspirin EC 81 MG tablet Take 81 mg by mouth daily. Swallow whole.    [provider]  atorvastatin (LIPITOR) 40 MG tablet Take 40 mg by mouth at bedtime.  04/24/13   [provider]  cholecalciferol (VITAMIN D) 1000 UNITS tablet Take 1,000 Units by mouth daily.    [provider]  Ferrous Sulfate Dried (FERROUS SULFATE CR PO) Take 150 mg by mouth at bedtime.     [provider]  loratadine (CLARITIN) 10 MG tablet Take 10 mg by mouth daily.    [provider]  metoprolol succinate (TOPROL-XL) 25 MG 24 hr tablet Take 25 mg by mouth 2 (two) times daily.    [provider]  pantoprazole (PROTONIX) 40 MG tablet Take 1 tablet (40 mg total) by mouth 2 (two) times daily. Patient taking differently: Take 40 mg by mouth daily. 06/24/13   Avon Gully, MD  predniSONE (DELTASONE) 20 MG tablet Take 2 tablets (40 mg total) by mouth daily. Patient not taking: Reported on 05/24/2020 03/18/20   Eber Hong, MD  lisinopril (PRINIVIL,ZESTRIL) 10 MG tablet Take 10 mg by mouth daily.    03/18/20  [provider]    Allergies as of 05/25/2020 - Review Complete 05/24/2020  Allergen Reaction Noted  . Lisinopril Swelling 03/18/2020  . Omnipaque [iohexol]  Other (See Comments) 09/19/2010  . Penicillins Other (See Comments) 09/19/2010    Family History  Problem Relation Age of Onset  . Stomach cancer Mother 74  . Breast cancer Sister 24  . Diabetes Mellitus II Sister   . Colon cancer Neg Hx   . Esophageal cancer Neg Hx     Social History   Socioeconomic History  . Marital status: Divorced    Spouse name: Not on file  . Number of children: 0  . Years of education: Not on file  . Highest education level: Not on file  Occupational History  . Occupation:  disabled    Associate Professor: retired  Tobacco Use  . Smoking status: Former Smoker    Packs/day: 1.00    Years: 40.00    Pack years: 40.00    Types: Cigarettes    Quit date: 09/14/2010    Years since quitting: 9.7  . Smokeless tobacco: Never Used  Substance and Sexual Activity  . Alcohol use: No  . Drug use: No    Comment: marijuana (QUIT couple yrs ago)  . Sexual activity: Not Currently  Other Topics Concern  . Not on file  Social History Narrative   Lives alone   Social Determinants of Health   Financial Resource Strain: Not on file  Food Insecurity: Not on file  Transportation Needs: Not on file  Physical Activity: Not on file  Stress: Not on file  Social Connections: Not on file  Intimate Partner Violence: Not on file    Review of Systems: See HPI, otherwise negative ROS  Impression/Plan: [name] is here for a GIVENs capsule for anemia.   The risks of the procedure including infection, bleed, or perforation as well as benefits, limitations, alternatives and imponderables have been reviewed with the patient. Questions have been answered. All parties agreeable.

## 2020-06-28 ENCOUNTER — Encounter (HOSPITAL_COMMUNITY): Payer: Self-pay | Admitting: Internal Medicine

## 2020-06-30 ENCOUNTER — Telehealth: Payer: Self-pay | Admitting: Gastroenterology

## 2020-06-30 DIAGNOSIS — D509 Iron deficiency anemia, unspecified: Secondary | ICD-10-CM

## 2020-06-30 DIAGNOSIS — D649 Anemia, unspecified: Secondary | ICD-10-CM

## 2020-06-30 NOTE — Telephone Encounter (Signed)
Dena: Please let patient know I reviewed capsule study. Capsule study with possible source of IDA as AVMs. I do note she has a large hiatal hernia with Sheria Lang lesions in the past. Could also contribute.   Recommend continuing PPI therapy, avoid NSAIDs, and referral to Hematology.   [facility] clinical pool: please refer to Hematology due IDA.

## 2020-06-30 NOTE — Procedures (Signed)
Small Bowel Givens Capsule Study Procedure date:  06/26/20  Referring Provider:  Wynne Dust, NP PCP:  Dr. Avon Gully, MD  Indication for procedure:  75 year old female with persistent IDA, undergoing colonoscopy with hemorrhoids and diverticulosis and EGD with large hiatal hernia, negative H.pylori, and normal duodenum. Hgb in 7 range in Oct 2021, most recently 9.7. Ferritin markedly low at 3 in Sept 2021. Currently on oral iron. Capsule study now due to IDA.    Findings:   Capsule study complete to the cecum. No obvious mass or concerning lesions. Small angioectasias non-bleeding in proximal small bowel. Distal small bowel with large lymphangiectasia (04:05:05) and lymphangiectasias scattered through mid to distal bowel non-specific. No obvious ulcers.   First Gastric image:  00:02:39 First Duodenal image: 00:57:28 First Cecal image: 05:08:23 Gastric Passage time: 0h **61m Small Bowel Passage time:  4h 35m  Summary & Recommendations: 75 year old female with notable IDA, now improving Hgb from 7 range in October 2021 to most recently 9.7. Profoundly low ferritin in Sept 2021 without recent on file. Colonoscopy and EGD both completed recently as noted above.   Capsule study with possible source of IDA as AVMs. I do note she has a large hiatal hernia with Sheria Lang lesions in the past. Could also contribute.   Recommend continuing PPI therapy, avoid NSAIDs, and referral to Hematology.   Will refer to Hematology for IDA. Keep upcoming appointment here upcoming in March 2022.  Gelene Mink, PhD, ANP-BC [facility]

## 2020-07-03 NOTE — Telephone Encounter (Signed)
noted 

## 2020-07-03 NOTE — Addendum Note (Signed)
Addended by: Corrie Mckusick on: 07/03/2020 11:32 AM   Modules accepted: Orders

## 2020-07-03 NOTE — Telephone Encounter (Signed)
Referral sent to hematology at [facility] via Epic.

## 2020-07-03 NOTE — Telephone Encounter (Signed)
Phoned and spoke with the pt advised of her Capsule study and gave her the instructions from [facility] regarding her medications. Pt was also advised of being referred to Hematologist for further evaluation. Pt agreed but hopes this appt will be in [place] because she drives herself and doesn't want to ask for assistance to get to [place].

## 2020-07-03 NOTE — Telephone Encounter (Signed)
Absolutely, would recommend Hematology here in [place]. It is located 4th floor of [address]

## 2020-07-24 DIAGNOSIS — K219 Gastro-esophageal reflux disease without esophagitis: Secondary | ICD-10-CM | POA: Diagnosis not present

## 2020-07-24 DIAGNOSIS — I10 Essential (primary) hypertension: Secondary | ICD-10-CM | POA: Diagnosis not present

## 2020-07-28 ENCOUNTER — Inpatient Hospital Stay (HOSPITAL_COMMUNITY): Payer: Medicare PPO | Attending: Hematology and Oncology | Admitting: Hematology and Oncology

## 2020-07-28 ENCOUNTER — Inpatient Hospital Stay (HOSPITAL_COMMUNITY): Payer: Medicare PPO

## 2020-07-28 ENCOUNTER — Other Ambulatory Visit: Payer: Self-pay

## 2020-07-28 ENCOUNTER — Encounter (HOSPITAL_COMMUNITY): Payer: Self-pay | Admitting: Hematology and Oncology

## 2020-07-28 VITALS — BP 155/67 | HR 59 | Temp 96.8°F | Resp 16 | Ht 63.0 in | Wt 188.8 lb

## 2020-07-28 DIAGNOSIS — Q2730 Arteriovenous malformation, site unspecified: Secondary | ICD-10-CM | POA: Insufficient documentation

## 2020-07-28 DIAGNOSIS — Z90710 Acquired absence of both cervix and uterus: Secondary | ICD-10-CM | POA: Insufficient documentation

## 2020-07-28 DIAGNOSIS — D500 Iron deficiency anemia secondary to blood loss (chronic): Secondary | ICD-10-CM | POA: Diagnosis not present

## 2020-07-28 DIAGNOSIS — Z803 Family history of malignant neoplasm of breast: Secondary | ICD-10-CM | POA: Insufficient documentation

## 2020-07-28 DIAGNOSIS — Z87891 Personal history of nicotine dependence: Secondary | ICD-10-CM | POA: Insufficient documentation

## 2020-07-28 LAB — IRON AND TIBC
Iron: 15 ug/dL — ABNORMAL LOW (ref 28–170)
Saturation Ratios: 3 % — ABNORMAL LOW (ref 10.4–31.8)
TIBC: 515 ug/dL — ABNORMAL HIGH (ref 250–450)
UIBC: 500 ug/dL

## 2020-07-28 LAB — CBC WITH DIFFERENTIAL/PLATELET
Abs Immature Granulocytes: 0.02 10*3/uL (ref 0.00–0.07)
Basophils Absolute: 0.1 10*3/uL (ref 0.0–0.1)
Basophils Relative: 1 %
Eosinophils Absolute: 0.4 10*3/uL (ref 0.0–0.5)
Eosinophils Relative: 6 %
HCT: 36.2 % (ref 36.0–46.0)
Hemoglobin: 9.8 g/dL — ABNORMAL LOW (ref 12.0–15.0)
Immature Granulocytes: 0 %
Lymphocytes Relative: 27 %
Lymphs Abs: 1.9 10*3/uL (ref 0.7–4.0)
MCH: 18.2 pg — ABNORMAL LOW (ref 26.0–34.0)
MCHC: 27.1 g/dL — ABNORMAL LOW (ref 30.0–36.0)
MCV: 67.4 fL — ABNORMAL LOW (ref 80.0–100.0)
Monocytes Absolute: 0.4 10*3/uL (ref 0.1–1.0)
Monocytes Relative: 5 %
Neutro Abs: 4.2 10*3/uL (ref 1.7–7.7)
Neutrophils Relative %: 61 %
Platelets: 327 10*3/uL (ref 150–400)
RBC: 5.37 MIL/uL — ABNORMAL HIGH (ref 3.87–5.11)
RDW: 23 % — ABNORMAL HIGH (ref 11.5–15.5)
WBC: 6.9 10*3/uL (ref 4.0–10.5)
nRBC: 0 % (ref 0.0–0.2)

## 2020-07-28 LAB — FERRITIN: Ferritin: 4 ng/mL — ABNORMAL LOW (ref 11–307)

## 2020-07-28 NOTE — Progress Notes (Signed)
[facility] CONSULT NOTE  Patient Care Team: Avon Gully, MD as PCP - General (Internal Medicine) Lanelle Bal, DO as Consulting Physician (Internal Medicine)  CHIEF COMPLAINTS/PURPOSE OF CONSULTATION:  IDA  ASSESSMENT & PLAN:  No problem-specific Assessment & Plan notes found for this encounter.  No orders of the defined types were placed in this encounter.  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 She is agreeable to IV iron since she doesn't want to take oral iron. She should continue monitoring labs every 3/4 months. I dont believe her anemia is related to underlying BMB. Supportive plan placed for injectafer. Injectafer is very well tolerated, with rare electrolyte disturbances and a very small risk of anaphylaxis.  HISTORY OF PRESENTING ILLNESS:   [name] 75 y.o. female is here because of IDA  This is a very pleasant 75 yr old female patient with PMH significant for CAD s/p CABG, COPD, HTN referred to hematology for IDA. Ms Lynley recently underwent capsule endoscopy which was thought to be significant for AVM and possible blood loss from AVM. She denies any hematochezia or melena. She is now taking iron tablets, but complains of increasing flatulence and black stool She also noticed some ankle swelling in the past couple days. Rest of the pertinent 10 point ROS reviewed and negative.  REVIEW OF SYSTEMS:   Constitutional: Denies fevers, chills or abnormal night sweats Eyes: Denies blurriness of vision, double vision or watery eyes Ears, nose, mouth, throat, and face: Denies mucositis or sore throat Respiratory: Denies cough, dyspnea or wheezes Cardiovascular: Denies palpitation, chest discomfort or lower extremity swelling Gastrointestinal:  Denies nausea, heartburn or change in bowel habits Skin: Denies abnormal  skin rashes Lymphatics: Denies new lymphadenopathy or easy bruising Neurological:Denies numbness, tingling or new weaknesses Behavioral/Psych: Mood is stable, no new changes   MEDICAL HISTORY:  Past Medical History:  Diagnosis Date  . Anxiety   . CAD (coronary artery disease)    Multivessel status post CABG 1998  . Cholelithiasis   . Chronic back pain   . Colitis, ischemic (HCC) 03/2011  . COPD (chronic obstructive pulmonary disease) (HCC)   . Diverticulosis   . Essential hypertension   . GERD (gastroesophageal reflux disease)   . GI bleed 03/2011  . Hiatal hernia   . Multiple pulmonary nodules 03/2011  . Nephrolithiasis     SURGICAL HISTORY: Past Surgical History:  Procedure Laterality Date  . ABDOMINAL HYSTERECTOMY     [facility]O  . BACK SURGERY    . BIOPSY  03/14/2020   Procedure: BIOPSY;  Surgeon: Lanelle Bal, DO;  [facility];  Service: Endoscopy;;  . COLONOSCOPY  04/16/2011   Dr. Darrick Penna: moderate diverticulosis, small internal hemorrhoids, likely ischemic colitis  . COLONOSCOPY WITH PROPOFOL N/A 03/14/2020   Procedure: COLONOSCOPY WITH PROPOFOL;  Surgeon: Lanelle Bal, DO;  Location: [facility];  Service: Endoscopy;  Laterality: N/A;  1:00pm  . CORONARY ARTERY BYPASS GRAFT  1998  . CORONARY STENT PLACEMENT  1995  . ESOPHAGOGASTRODUODENOSCOPY N/A 06/23/2013   Dr. Jena Gauss: cervical esophageal web likely s/p dilation with scope, non-critical Schatzki's ring, large hiatal hernia with Sheria Lang lesions, chronic active gastritis  . ESOPHAGOGASTRODUODENOSCOPY (EGD) WITH PROPOFOL N/A 03/14/2020   Procedure: ESOPHAGOGASTRODUODENOSCOPY (EGD) WITH PROPOFOL;  Surgeon: Lanelle Bal, DO;  [facility];  Service: Endoscopy;  Laterality: N/A;  .  GIVENS CAPSULE STUDY N/A 06/26/2020   Procedure: GIVENS CAPSULE STUDY;  Surgeon: Lanelle Bal, DO;  Location: [facility];  Service: Endoscopy;  Laterality: N/A;  7:30am  . SALPINGOOPHORECTOMY Left   .  SMALL INTESTINE SURGERY    . SPINAL FUSION     x3    SOCIAL HISTORY: Social History   Socioeconomic History  . Marital status: Divorced    Spouse name: Not on file  . Number of children: 0  . Years of education: Not on file  . Highest education level: Not on file  Occupational History  . Occupation: disabled    Associate Professor: retired  Tobacco Use  . Smoking status: Former Smoker    Packs/day: 1.00    Years: 40.00    Pack years: 40.00    Types: Cigarettes    Quit date: 09/14/2010    Years since quitting: 9.8  . Smokeless tobacco: Never Used  Substance and Sexual Activity  . Alcohol use: No  . Drug use: No    Comment: marijuana (QUIT couple yrs ago)  . Sexual activity: Not Currently  Other Topics Concern  . Not on file  Social History Narrative   Lives alone   Social Determinants of Health   Financial Resource Strain: Not on file  Food Insecurity: Not on file  Transportation Needs: Not on file  Physical Activity: Not on file  Stress: Not on file  Social Connections: Not on file  Intimate Partner Violence: Not At Risk  . Fear of Current or Ex-Partner: No  . Emotionally Abused: No  . Physically Abused: No  . Sexually Abused: No    FAMILY HISTORY: Family History  Problem Relation Age of Onset  . Stomach cancer Mother 44  . Breast cancer Sister 74  . Diabetes Mellitus II Sister   . Colon cancer Neg Hx   . Esophageal cancer Neg Hx     ALLERGIES:  is allergic to lisinopril, omnipaque [iohexol], and penicillins.  MEDICATIONS:  Current Outpatient Medications  Medication Sig Dispense Refill  . amLODipine (NORVASC) 5 MG tablet     . aspirin EC 81 MG tablet Take 81 mg by mouth daily. Swallow whole.    Marland Kitchen atorvastatin (LIPITOR) 40 MG tablet Take 40 mg by mouth at bedtime.     . cholecalciferol (VITAMIN D) 1000 UNITS tablet Take 1,000 Units by mouth daily.    . Ferrous Sulfate Dried (FERROUS SULFATE CR PO) Take 150 mg by mouth at bedtime.     Marland Kitchen loratadine (CLARITIN)  10 MG tablet Take 10 mg by mouth daily.    . metoprolol succinate (TOPROL-XL) 25 MG 24 hr tablet Take 25 mg by mouth 2 (two) times daily.    . pantoprazole (PROTONIX) 40 MG tablet Take 1 tablet (40 mg total) by mouth 2 (two) times daily. (Patient taking differently: Take 40 mg by mouth daily.) 60 tablet 3  . predniSONE (DELTASONE) 20 MG tablet Take 2 tablets (40 mg total) by mouth daily. 10 tablet 0   No current facility-administered medications for this visit.     PHYSICAL EXAMINATION:  ECOG PERFORMANCE STATUS: 0 - Asymptomatic  Vitals:   07/28/20 1235  BP: (!) 155/67  Pulse: (!) 59  Resp: 16  Temp: (!) 96.8 F (36 C)  SpO2: 98%   Filed Weights   07/28/20 1235  Weight: 188 lb 12.8 oz (85.6 kg)    GENERAL:alert, no distress and comfortable SKIN: skin color, texture, turgor are normal, no rashes or significant lesions  EYES: normal, conjunctiva are pink and non-injected, sclera clear OROPHARYNX:no exudate, no erythema and lips, buccal mucosa, and tongue normal  NECK: supple, thyroid normal size, non-tender, without nodularity LYMPH:  no palpable lymphadenopathy in the cervical, axillary or inguinal LUNGS: clear to auscultation and percussion with normal breathing effort HEART: regular rate & rhythm and no murmurs and no lower extremity edema ABDOMEN:abdomen soft, non-tender and normal bowel sounds Musculoskeletal:no cyanosis of digits and no clubbing  PSYCH: alert & oriented x 3 with fluent speech NEURO: no focal motor/sensory deficits  LABORATORY DATA:  I have reviewed the data as listed Lab Results  Component Value Date   WBC 7.5 05/24/2020   HGB 9.7 (L) 05/24/2020   HCT 37.3 05/24/2020   MCV 66.6 (L) 05/24/2020   PLT 377 05/24/2020     Chemistry      Component Value Date/Time   NA 140 03/18/2020 1119   K 4.2 03/18/2020 1119   CL 105 03/18/2020 1119   CO2 26 03/18/2020 1119   BUN 15 03/18/2020 1119   CREATININE 1.05 (H) 03/18/2020 1119      Component Value  Date/Time   CALCIUM 9.1 03/18/2020 1119   ALKPHOS 47 03/18/2020 1119   AST 14 (L) 03/18/2020 1119   ALT 12 03/18/2020 1119   BILITOT 0.7 03/18/2020 1119     Reviewed endoscopy, colonoscopy and capsule endoscopy reports. Iron panel and ferritin consistent with IDA>  RADIOGRAPHIC STUDIES: I have personally reviewed the radiological images as listed and agreed with the findings in the report. No results found.  All questions were answered. The patient knows to call the clinic with any problems, questions or concerns. I spent 45 minutes in the care of this patient including H and P, review of records, counseling and coordination of care.     Rachel Moulds, MD 07/28/2020 1:21 PM

## 2020-07-31 ENCOUNTER — Other Ambulatory Visit: Payer: Self-pay | Admitting: Hematology and Oncology

## 2020-07-31 ENCOUNTER — Telehealth: Payer: Self-pay | Admitting: *Deleted

## 2020-07-31 NOTE — Progress Notes (Signed)
Injectafer changed to venofer given preferred therapy by her insurance.  [name]

## 2020-07-31 NOTE — Telephone Encounter (Signed)
Pt called & left vm asking for results of her labs done Friday.  Message sent to Dr Iruku/Pod RN.

## 2020-07-31 NOTE — Progress Notes (Signed)
I called the patient back and discussed the results. I discussed that she will benefit from iron infusion. Discussed that Venofer is the preferred formulary. She was wondering if she should continue oral iron. I recommended that she should take ferrous sulfate TID for a few months to catch up with severe iron deficiency or she can try iron infusion. She would rather do the iron infusion.  [name]

## 2020-08-07 ENCOUNTER — Other Ambulatory Visit: Payer: Self-pay

## 2020-08-07 ENCOUNTER — Encounter (HOSPITAL_COMMUNITY): Payer: Self-pay

## 2020-08-07 ENCOUNTER — Inpatient Hospital Stay (HOSPITAL_COMMUNITY): Payer: Medicare PPO

## 2020-08-07 VITALS — BP 129/60 | HR 95 | Temp 97.0°F | Resp 18

## 2020-08-07 DIAGNOSIS — D500 Iron deficiency anemia secondary to blood loss (chronic): Secondary | ICD-10-CM

## 2020-08-07 DIAGNOSIS — Z803 Family history of malignant neoplasm of breast: Secondary | ICD-10-CM | POA: Diagnosis not present

## 2020-08-07 DIAGNOSIS — Q2730 Arteriovenous malformation, site unspecified: Secondary | ICD-10-CM | POA: Diagnosis not present

## 2020-08-07 DIAGNOSIS — Z87891 Personal history of nicotine dependence: Secondary | ICD-10-CM | POA: Diagnosis not present

## 2020-08-07 DIAGNOSIS — Z90710 Acquired absence of both cervix and uterus: Secondary | ICD-10-CM | POA: Diagnosis not present

## 2020-08-07 MED ORDER — ACETAMINOPHEN 325 MG PO TABS
650.0000 mg | ORAL_TABLET | Freq: Once | ORAL | Status: AC
  Administered 2020-08-07: 650 mg via ORAL

## 2020-08-07 MED ORDER — SODIUM CHLORIDE 0.9 % IV SOLN: Freq: Once | INTRAVENOUS | Status: AC

## 2020-08-07 MED ORDER — LORATADINE 10 MG PO TABS
10.0000 mg | ORAL_TABLET | Freq: Once | ORAL | Status: AC
  Administered 2020-08-07: 10 mg via ORAL

## 2020-08-07 MED ORDER — ACETAMINOPHEN 325 MG PO TABS
ORAL_TABLET | ORAL | Status: AC
  Filled 2020-08-07: qty 2

## 2020-08-07 MED ORDER — SODIUM CHLORIDE 0.9 % IV SOLN
200.0000 mg | Freq: Once | INTRAVENOUS | Status: AC
  Administered 2020-08-07: 200 mg via INTRAVENOUS
  Filled 2020-08-07: qty 200

## 2020-08-07 MED ORDER — LORATADINE 10 MG PO TABS
ORAL_TABLET | ORAL | Status: AC
  Filled 2020-08-07: qty 1

## 2020-08-07 MED ORDER — FAMOTIDINE 20 MG PO TABS
ORAL_TABLET | ORAL | Status: AC
  Filled 2020-08-07: qty 1

## 2020-08-07 MED ORDER — FAMOTIDINE 20 MG PO TABS
20.0000 mg | ORAL_TABLET | Freq: Once | ORAL | Status: AC
  Administered 2020-08-07: 20 mg via ORAL

## 2020-08-07 NOTE — Patient Instructions (Addendum)
[facility] at [facility]  Discharge Instructions: Venofer Iron Infusion  _______________________________________________________________  Thank you for choosing [facility] at [facility] to provide your oncology and hematology care.  To afford each patient quality time with our providers, please arrive at least 15 minutes before your scheduled appointment.  You need to re-schedule your appointment if you arrive 10 or more minutes late.  We strive to give you quality time with our providers, and arriving late affects you and other patients whose appointments are after yours.  Also, if you no show three or more times for appointments you may be dismissed from the clinic.  Again, thank you for choosing [facility] at [facility]. Our hope is that these requests will allow you access to exceptional care and in a timely manner. _______________________________________________________________  If you have questions after your visit, please contact our office at [phone] between the hours of 8:30 a.m. and 5:00 p.m. Voicemails left after 4:30 p.m. will not be returned until the following business day. _______________________________________________________________  For prescription refill requests, have your pharmacy contact our office. _______________________________________________________________  Recommendations made by the consultant and any test results will be sent to your referring physician. _______________________________________________________________Iron Sucrose injection What is this medicine? IRON SUCROSE (AHY ern SOO krohs) is an iron complex. Iron is used to make healthy red blood cells, which carry oxygen and nutrients throughout the body. This medicine is used to treat iron deficiency anemia in people with chronic kidney disease. This medicine may be used for other purposes; ask your health care provider or  pharmacist if you have questions. COMMON BRAND NAME(S): Venofer What should I tell my health care provider before I take this medicine? They need to know if you have any of these conditions:  anemia not caused by low iron levels  heart disease  high levels of iron in the blood  kidney disease  liver disease  an unusual or allergic reaction to iron, other medicines, foods, dyes, or preservatives  pregnant or trying to get pregnant  breast-feeding How should I use this medicine? This medicine is for infusion into a vein. It is given by a health care professional in a hospital or clinic setting. Talk to your pediatrician regarding the use of this medicine in children. While this drug may be prescribed for children as young as 2 years for selected conditions, precautions do apply. Overdosage: If you think you have taken too much of this medicine contact a poison control center or emergency room at once. NOTE: This medicine is only for you. Do not share this medicine with others. What if I miss a dose? It is important not to miss your dose. Call your doctor or health care professional if you are unable to keep an appointment. What may interact with this medicine? Do not take this medicine with any of the following medications:  deferoxamine  dimercaprol  other iron products This medicine may also interact with the following medications:  chloramphenicol  deferasirox This list may not describe all possible interactions. Give your health care provider a list of all the medicines, herbs, non-prescription drugs, or dietary supplements you use. Also tell them if you smoke, drink alcohol, or use illegal drugs. Some items may interact with your medicine. What should I watch for while using this medicine? Visit your doctor or healthcare professional regularly. Tell your doctor or healthcare professional if your symptoms do not start to get better or  if they get worse. You may need blood  work done while you are taking this medicine. You may need to follow a special diet. Talk to your doctor. Foods that contain iron include: whole grains/cereals, dried fruits, beans, or peas, leafy green vegetables, and organ meats (liver, kidney). What side effects may I notice from receiving this medicine? Side effects that you should report to your doctor or health care professional as soon as possible:  allergic reactions like skin rash, itching or hives, swelling of the face, lips, or tongue  breathing problems  changes in blood pressure  cough  fast, irregular heartbeat  feeling faint or lightheaded, falls  fever or chills  flushing, sweating, or hot feelings  joint or muscle aches/pains  seizures  swelling of the ankles or feet  unusually weak or tired Side effects that usually do not require medical attention (report to your doctor or health care professional if they continue or are bothersome):  diarrhea  feeling achy  headache  irritation at site where injected  nausea, vomiting  stomach upset  tiredness This list may not describe all possible side effects. Call your doctor for medical advice about side effects. You may report side effects to FDA at 1-800-FDA-1088. Where should I keep my medicine? This drug is given in a hospital or clinic and will not be stored at home. NOTE: This sheet is a summary. It may not cover all possible information. If you have questions about this medicine, talk to your doctor, pharmacist, or health care provider.  2021 Elsevier/Gold Standard (2011-02-21 17:14:35)

## 2020-08-07 NOTE — Progress Notes (Signed)
Patient presents today for IV Venofer infusion. Vital signs are stable. Patient denies any changes since the last iron infusion. Patient denies any complaints today. MAR reviewed and updated.   Venofer given today per MD orders. Tolerated infusion without adverse affects. Vital signs stable. No complaints at this time. Discharged from clinic ambulatory in stable condition. Alert and oriented x 3. F/U with [facility] as scheduled.

## 2020-08-10 ENCOUNTER — Other Ambulatory Visit: Payer: Self-pay

## 2020-08-10 ENCOUNTER — Inpatient Hospital Stay (HOSPITAL_COMMUNITY): Payer: Medicare PPO

## 2020-08-10 ENCOUNTER — Encounter (HOSPITAL_COMMUNITY): Payer: Self-pay

## 2020-08-10 VITALS — BP 115/51 | HR 88 | Temp 97.0°F | Resp 18

## 2020-08-10 DIAGNOSIS — Z87891 Personal history of nicotine dependence: Secondary | ICD-10-CM | POA: Diagnosis not present

## 2020-08-10 DIAGNOSIS — Z90710 Acquired absence of both cervix and uterus: Secondary | ICD-10-CM | POA: Diagnosis not present

## 2020-08-10 DIAGNOSIS — Q2730 Arteriovenous malformation, site unspecified: Secondary | ICD-10-CM | POA: Diagnosis not present

## 2020-08-10 DIAGNOSIS — D500 Iron deficiency anemia secondary to blood loss (chronic): Secondary | ICD-10-CM

## 2020-08-10 DIAGNOSIS — Z803 Family history of malignant neoplasm of breast: Secondary | ICD-10-CM | POA: Diagnosis not present

## 2020-08-10 MED ORDER — LORATADINE 10 MG PO TABS
ORAL_TABLET | ORAL | Status: AC
  Filled 2020-08-10: qty 1

## 2020-08-10 MED ORDER — LORATADINE 10 MG PO TABS
10.0000 mg | ORAL_TABLET | Freq: Once | ORAL | Status: AC
  Administered 2020-08-10: 10 mg via ORAL

## 2020-08-10 MED ORDER — SODIUM CHLORIDE 0.9 % IV SOLN
200.0000 mg | Freq: Once | INTRAVENOUS | Status: AC
  Administered 2020-08-10: 200 mg via INTRAVENOUS
  Filled 2020-08-10: qty 200

## 2020-08-10 MED ORDER — FAMOTIDINE 20 MG PO TABS
ORAL_TABLET | ORAL | Status: AC
  Filled 2020-08-10: qty 1

## 2020-08-10 MED ORDER — ACETAMINOPHEN 325 MG PO TABS
650.0000 mg | ORAL_TABLET | Freq: Once | ORAL | Status: AC
  Administered 2020-08-10: 650 mg via ORAL

## 2020-08-10 MED ORDER — SODIUM CHLORIDE 0.9 % IV SOLN: Freq: Once | INTRAVENOUS | Status: AC

## 2020-08-10 MED ORDER — FAMOTIDINE 20 MG PO TABS
20.0000 mg | ORAL_TABLET | Freq: Once | ORAL | Status: AC
Start: 2020-08-10 — End: 2020-08-10
  Administered 2020-08-10: 20 mg via ORAL

## 2020-08-10 MED ORDER — ACETAMINOPHEN 325 MG PO TABS
ORAL_TABLET | ORAL | Status: AC
  Filled 2020-08-10: qty 2

## 2020-08-10 NOTE — Patient Instructions (Addendum)
[facility] at [facility]  Discharge Instructions:  Venofer  _______________________________________________________________  Thank you for choosing [facility] at [facility] to provide your oncology and hematology care.  To afford each patient quality time with our providers, please arrive at least 15 minutes before your scheduled appointment.  You need to re-schedule your appointment if you arrive 10 or more minutes late.  We strive to give you quality time with our providers, and arriving late affects you and other patients whose appointments are after yours.  Also, if you no show three or more times for appointments you may be dismissed from the clinic.  Again, thank you for choosing [facility] at [facility] Our hope is that these requests will allow you access to exceptional care and in a timely manner. _______________________________________________________________  If you have questions after your visit, please contact our office at [phone] between the hours of 8:30 a.m. and 5:00 p.m. Voicemails left after 4:30 p.m. will not be returned until the following business day. _______________________________________________________________  For prescription refill requests, have your pharmacy contact our office. _______________________________________________________________  Recommendations made by the consultant and any test results will be sent to your referring physician. _______________________________________________________________Iron Sucrose injection What is this medicine? IRON SUCROSE (AHY ern SOO krohs) is an iron complex. Iron is used to make healthy red blood cells, which carry oxygen and nutrients throughout the body. This medicine is used to treat iron deficiency anemia in people with chronic kidney disease. This medicine may be used for other purposes; ask your health care provider or pharmacist if you  have questions. COMMON BRAND NAME(S): Venofer What should I tell my health care provider before I take this medicine? They need to know if you have any of these conditions:  anemia not caused by low iron levels  heart disease  high levels of iron in the blood  kidney disease  liver disease  an unusual or allergic reaction to iron, other medicines, foods, dyes, or preservatives  pregnant or trying to get pregnant  breast-feeding How should I use this medicine? This medicine is for infusion into a vein. It is given by a health care professional in a hospital or clinic setting. Talk to your pediatrician regarding the use of this medicine in children. While this drug may be prescribed for children as young as 2 years for selected conditions, precautions do apply. Overdosage: If you think you have taken too much of this medicine contact a poison control center or emergency room at once. NOTE: This medicine is only for you. Do not share this medicine with others. What if I miss a dose? It is important not to miss your dose. Call your doctor or health care professional if you are unable to keep an appointment. What may interact with this medicine? Do not take this medicine with any of the following medications:  deferoxamine  dimercaprol  other iron products This medicine may also interact with the following medications:  chloramphenicol  deferasirox This list may not describe all possible interactions. Give your health care provider a list of all the medicines, herbs, non-prescription drugs, or dietary supplements you use. Also tell them if you smoke, drink alcohol, or use illegal drugs. Some items may interact with your medicine. What should I watch for while using this medicine? Visit your doctor or healthcare professional regularly. Tell your doctor or healthcare professional if your symptoms do not start to get better or if  they get worse. You may need blood work done while  you are taking this medicine. You may need to follow a special diet. Talk to your doctor. Foods that contain iron include: whole grains/cereals, dried fruits, beans, or peas, leafy green vegetables, and organ meats (liver, kidney). What side effects may I notice from receiving this medicine? Side effects that you should report to your doctor or health care professional as soon as possible:  allergic reactions like skin rash, itching or hives, swelling of the face, lips, or tongue  breathing problems  changes in blood pressure  cough  fast, irregular heartbeat  feeling faint or lightheaded, falls  fever or chills  flushing, sweating, or hot feelings  joint or muscle aches/pains  seizures  swelling of the ankles or feet  unusually weak or tired Side effects that usually do not require medical attention (report to your doctor or health care professional if they continue or are bothersome):  diarrhea  feeling achy  headache  irritation at site where injected  nausea, vomiting  stomach upset  tiredness This list may not describe all possible side effects. Call your doctor for medical advice about side effects. You may report side effects to FDA at 1-800-FDA-1088. Where should I keep my medicine? This drug is given in a hospital or clinic and will not be stored at home. NOTE: This sheet is a summary. It may not cover all possible information. If you have questions about this medicine, talk to your doctor, pharmacist, or health care provider.  2021 Elsevier/Gold Standard (2011-02-21 17:14:35)

## 2020-08-10 NOTE — Progress Notes (Signed)
Patient presents today for IV Venofer infusion. Vital signs are stable. Patient denies any changes since the last iron infusion. Patient denies any complaints today. MAR reviewed and updated.   Venofer given today per MD orders. Tolerated infusion without adverse affects. Vital signs stable. No complaints at this time. Discharged from clinic ambulatory in stable condition. Alert and oriented x 3. F/U with [facility] as scheduled.  

## 2020-08-14 ENCOUNTER — Other Ambulatory Visit: Payer: Self-pay

## 2020-08-14 ENCOUNTER — Inpatient Hospital Stay (HOSPITAL_COMMUNITY): Payer: Medicare PPO

## 2020-08-14 VITALS — BP 123/73 | HR 55 | Temp 96.7°F | Resp 18

## 2020-08-14 DIAGNOSIS — Z87891 Personal history of nicotine dependence: Secondary | ICD-10-CM | POA: Diagnosis not present

## 2020-08-14 DIAGNOSIS — Z803 Family history of malignant neoplasm of breast: Secondary | ICD-10-CM | POA: Diagnosis not present

## 2020-08-14 DIAGNOSIS — Q2730 Arteriovenous malformation, site unspecified: Secondary | ICD-10-CM | POA: Diagnosis not present

## 2020-08-14 DIAGNOSIS — D500 Iron deficiency anemia secondary to blood loss (chronic): Secondary | ICD-10-CM

## 2020-08-14 DIAGNOSIS — Z90710 Acquired absence of both cervix and uterus: Secondary | ICD-10-CM | POA: Diagnosis not present

## 2020-08-14 MED ORDER — LORATADINE 10 MG PO TABS
10.0000 mg | ORAL_TABLET | Freq: Once | ORAL | Status: AC
  Administered 2020-08-14: 10 mg via ORAL

## 2020-08-14 MED ORDER — LORATADINE 10 MG PO TABS
ORAL_TABLET | ORAL | Status: AC
  Filled 2020-08-14: qty 2

## 2020-08-14 MED ORDER — ACETAMINOPHEN 325 MG PO TABS
650.0000 mg | ORAL_TABLET | Freq: Once | ORAL | Status: AC
  Administered 2020-08-14: 650 mg via ORAL

## 2020-08-14 MED ORDER — SODIUM CHLORIDE 0.9 % IV SOLN
200.0000 mg | Freq: Once | INTRAVENOUS | Status: AC
  Administered 2020-08-14: 200 mg via INTRAVENOUS
  Filled 2020-08-14: qty 10

## 2020-08-14 MED ORDER — FAMOTIDINE 20 MG PO TABS
20.0000 mg | ORAL_TABLET | Freq: Once | ORAL | Status: AC
  Administered 2020-08-14: 20 mg via ORAL

## 2020-08-14 MED ORDER — ACETAMINOPHEN 325 MG PO TABS
ORAL_TABLET | ORAL | Status: AC
  Filled 2020-08-14: qty 2

## 2020-08-14 MED ORDER — SODIUM CHLORIDE 0.9 % IV SOLN: Freq: Once | INTRAVENOUS | Status: AC

## 2020-08-14 MED ORDER — FAMOTIDINE 20 MG PO TABS
ORAL_TABLET | ORAL | Status: AC
  Filled 2020-08-14: qty 2

## 2020-08-14 NOTE — Progress Notes (Signed)
[name] presents today for IV Venofer infusion per MDs orders in supportive therapy plan. Vitals have been reviewed and are stable and within parameters for infusion today. Infusion given without incident or complaint. Patient observed for 30 minutes post infusion per orders. VSS upon completion of infusion, IV flushed and removed per protocol, see MAR and IV flowsheet for details. Discharged in satisfactory condition with follow up instructions.  

## 2020-08-14 NOTE — Patient Instructions (Signed)

## 2020-08-18 ENCOUNTER — Encounter (HOSPITAL_COMMUNITY): Payer: Self-pay | Admitting: Hematology and Oncology

## 2020-08-18 ENCOUNTER — Inpatient Hospital Stay (HOSPITAL_BASED_OUTPATIENT_CLINIC_OR_DEPARTMENT_OTHER): Payer: Medicare PPO | Admitting: Hematology and Oncology

## 2020-08-18 ENCOUNTER — Inpatient Hospital Stay (HOSPITAL_COMMUNITY): Payer: Medicare PPO

## 2020-08-18 ENCOUNTER — Encounter (HOSPITAL_COMMUNITY): Payer: Self-pay

## 2020-08-18 ENCOUNTER — Other Ambulatory Visit: Payer: Self-pay

## 2020-08-18 VITALS — BP 151/66 | HR 63 | Temp 96.8°F | Resp 18 | Wt 192.0 lb

## 2020-08-18 VITALS — BP 151/66 | HR 54 | Temp 97.1°F | Resp 18

## 2020-08-18 DIAGNOSIS — Z90710 Acquired absence of both cervix and uterus: Secondary | ICD-10-CM | POA: Diagnosis not present

## 2020-08-18 DIAGNOSIS — Z87891 Personal history of nicotine dependence: Secondary | ICD-10-CM | POA: Diagnosis not present

## 2020-08-18 DIAGNOSIS — D500 Iron deficiency anemia secondary to blood loss (chronic): Secondary | ICD-10-CM

## 2020-08-18 DIAGNOSIS — Q2730 Arteriovenous malformation, site unspecified: Secondary | ICD-10-CM | POA: Diagnosis not present

## 2020-08-18 DIAGNOSIS — Z803 Family history of malignant neoplasm of breast: Secondary | ICD-10-CM | POA: Diagnosis not present

## 2020-08-18 MED ORDER — LORATADINE 10 MG PO TABS
10.0000 mg | ORAL_TABLET | Freq: Once | ORAL | Status: AC
  Administered 2020-08-18: 10 mg via ORAL
  Filled 2020-08-18: qty 1

## 2020-08-18 MED ORDER — FAMOTIDINE 20 MG PO TABS
20.0000 mg | ORAL_TABLET | Freq: Once | ORAL | Status: AC
  Administered 2020-08-18: 20 mg via ORAL
  Filled 2020-08-18: qty 1

## 2020-08-18 MED ORDER — SODIUM CHLORIDE 0.9 % IV SOLN
200.0000 mg | Freq: Once | INTRAVENOUS | Status: AC
  Administered 2020-08-18: 200 mg via INTRAVENOUS
  Filled 2020-08-18: qty 200

## 2020-08-18 MED ORDER — SODIUM CHLORIDE 0.9 % IV SOLN: Freq: Once | INTRAVENOUS | Status: AC

## 2020-08-18 MED ORDER — ACETAMINOPHEN 325 MG PO TABS
650.0000 mg | ORAL_TABLET | Freq: Once | ORAL | Status: AC
  Administered 2020-08-18: 650 mg via ORAL
  Filled 2020-08-18: qty 2

## 2020-08-18 NOTE — Progress Notes (Signed)
[facility] CONSULT NOTE  Patient Care Team: Valerie Gully, MD as PCP - General (Internal Medicine) Valerie Bal, DO as Consulting Physician (Internal Medicine)  CHIEF COMPLAINTS/PURPOSE OF CONSULTATION:  IDA  ASSESSMENT & PLAN:  No problem-specific Assessment & Plan notes found for this encounter.  No orders of the defined types were placed in this encounter.  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 She had 4 infusions so far including today, and tolerated it well. PE unremarkable today, no concerns. She will RTC in 6-8 weeks for FU on repeat labs She will also FU with gastroenterology as instructed.  HISTORY OF PRESENTING ILLNESS:   [name] 75 y.o. female is here because of IDA  This is a very pleasant 75 yr old female patient with PMH significant for CAD s/p CABG, COPD, HTN referred to hematology for IDA. Ms [name] recently underwent capsule endoscopy which was thought to be significant for AVM and possible blood loss from AVM. During her last visit, I recommended IV iron infusion given severe iron deficiency.  She is here for follow-up  Interim history  She is here for a FU. She has been tolerating iron infusion well. She feels well, notices that her stool is black since she took the iron.  No abdominal pain, one episode of diarrhea today.  She denies any change in breathing. No change in urinary habits or new neurological complaints. Rest of the pertinent 10 point ROS reviewed and negative.  REVIEW OF SYSTEMS:   Constitutional: Denies fevers, chills or abnormal night sweats Eyes: Denies blurriness of vision, double vision or watery eyes Ears, nose, mouth, throat, and face: Denies mucositis or sore throat Respiratory: Denies cough, dyspnea or wheezes Cardiovascular: Denies palpitation, chest discomfort or lower extremity swelling Gastrointestinal:  Denies nausea, heartburn or  change in bowel habits Skin: Denies abnormal skin rashes Lymphatics: Denies new lymphadenopathy or easy bruising Neurological:Denies numbness, tingling or new weaknesses Behavioral/Psych: Mood is stable, no new changes   MEDICAL HISTORY:  Past Medical History:  Diagnosis Date  . Anxiety   . CAD (coronary artery disease)    Multivessel status post CABG 1998  . Cholelithiasis   . Chronic back pain   . Colitis, ischemic (HCC) 03/2011  . COPD (chronic obstructive pulmonary disease) (HCC)   . Diverticulosis   . Essential hypertension   . GERD (gastroesophageal reflux disease)   . GI bleed 03/2011  . Hiatal hernia   . Multiple pulmonary nodules 03/2011  . Nephrolithiasis     SURGICAL HISTORY: Past Surgical History:  Procedure Laterality Date  . ABDOMINAL HYSTERECTOMY     [facility]  . BACK SURGERY    . BIOPSY  03/14/2020   Procedure: BIOPSY;  Surgeon: Valerie Bal, DO;  [facility];  Service: Endoscopy;;  . COLONOSCOPY  04/16/2011   Dr. Darrick [name]: moderate diverticulosis, small internal hemorrhoids, likely ischemic colitis  . COLONOSCOPY WITH PROPOFOL N/A 03/14/2020   Procedure: COLONOSCOPY WITH PROPOFOL;  Surgeon: Valerie Bal, DO;  Location: [facility];  Service: Endoscopy;  Laterality: N/A;  1:00pm  . CORONARY ARTERY BYPASS GRAFT  1998  . CORONARY STENT PLACEMENT  1995  . ESOPHAGOGASTRODUODENOSCOPY N/A 06/23/2013   Dr. Jena [name]: cervical esophageal web likely s/p dilation with scope, non-critical Schatzki's ring, large hiatal hernia with [name] lesions, chronic active gastritis  . ESOPHAGOGASTRODUODENOSCOPY (EGD) WITH PROPOFOL N/A 03/14/2020   Procedure: ESOPHAGOGASTRODUODENOSCOPY (EGD) WITH PROPOFOL;  Surgeon: [name],  Valerie Duos, DO;  [facility];  Service: Endoscopy;  Laterality: N/A;  . GIVENS CAPSULE STUDY N/A 06/26/2020   Procedure: GIVENS CAPSULE STUDY;  Surgeon: Valerie Bal, DO;  Location: [facility];  Service: Endoscopy;  Laterality:  N/A;  7:30am  . SALPINGOOPHORECTOMY Left   . SMALL INTESTINE SURGERY    . SPINAL FUSION     x3    SOCIAL HISTORY: Social History   Socioeconomic History  . Marital status: Divorced    Spouse name: Not on file  . Number of children: 0  . Years of education: Not on file  . Highest education level: Not on file  Occupational History  . Occupation: disabled    Associate Professor: retired  Tobacco Use  . Smoking status: Former Smoker    Packs/day: 1.00    Years: 40.00    Pack years: 40.00    Types: Cigarettes    Quit date: 09/14/2010    Years since quitting: 9.9  . Smokeless tobacco: Never Used  Substance and Sexual Activity  . Alcohol use: No  . Drug use: No    Comment: marijuana (QUIT couple yrs ago)  . Sexual activity: Not Currently  Other Topics Concern  . Not on file  Social History Narrative   Lives alone   Social Determinants of Health   Financial Resource Strain: Not on file  Food Insecurity: Not on file  Transportation Needs: Not on file  Physical Activity: Not on file  Stress: Not on file  Social Connections: Not on file  Intimate Partner Violence: Not At Risk  . Fear of Current or Ex-Partner: No  . Emotionally Abused: No  . Physically Abused: No  . Sexually Abused: No    FAMILY HISTORY: Family History  Problem Relation Age of Onset  . Stomach cancer Mother 11  . Breast cancer Sister 26  . Diabetes Mellitus II Sister   . Colon cancer Neg Hx   . Esophageal cancer Neg Hx     ALLERGIES:  is allergic to lisinopril, omnipaque [iohexol], and penicillins.  MEDICATIONS:  Current Outpatient Medications  Medication Sig Dispense Refill  . amLODipine (NORVASC) 5 MG tablet     . aspirin EC 81 MG tablet Take 81 mg by mouth daily. Swallow whole.    Marland Kitchen atorvastatin (LIPITOR) 40 MG tablet Take 40 mg by mouth at bedtime.     . cholecalciferol (VITAMIN D) 1000 UNITS tablet Take 1,000 Units by mouth daily.    . Ferrous Sulfate Dried (FERROUS SULFATE CR PO) Take 150 mg by  mouth at bedtime.     Marland Kitchen loratadine (CLARITIN) 10 MG tablet Take 10 mg by mouth daily.    . metoprolol succinate (TOPROL-XL) 25 MG 24 hr tablet Take 25 mg by mouth 2 (two) times daily.    . pantoprazole (PROTONIX) 40 MG tablet Take 1 tablet (40 mg total) by mouth 2 (two) times daily. (Patient taking differently: Take 40 mg by mouth daily.) 60 tablet 3   No current facility-administered medications for this visit.   Facility-Administered Medications Ordered in Other Visits  Medication Dose Route Frequency Provider Last Rate Last Admin  . iron sucrose (VENOFER) 200 mg in sodium chloride 0.9 % 100 mL IVPB  200 mg Intravenous Once Jabril Pursell, Burnice Logan, MD 440 mL/hr at 08/18/20 1104 200 mg at 08/18/20 1104     PHYSICAL EXAMINATION:  ECOG PERFORMANCE STATUS: 0 - Asymptomatic  Vitals:   08/18/20 1017  BP: (!) 151/66  Pulse: 63  Resp:  18  Temp: (!) 96.8 F (36 C)  SpO2: 100%   Filed Weights   08/18/20 1017  Weight: 192 lb (87.1 kg)    GENERAL:alert, no distress and comfortable SKIN: skin color, texture, turgor are normal, no rashes or significant lesions EYES: normal, conjunctiva are pink and non-injected, sclera clear OROPHARYNX:no exudate, no erythema and lips, buccal mucosa, and tongue normal  NECK: supple, thyroid normal size, non-tender, without nodularity LYMPH:  no palpable lymphadenopathy in the cervical, axillary or inguinal LUNGS: clear to auscultation and percussion with normal breathing effort HEART: regular rate & rhythm and no murmurs and no lower extremity edema ABDOMEN:abdomen soft, non-tender and normal bowel sounds Musculoskeletal:no cyanosis of digits and no clubbing  PSYCH: alert & oriented x 3 with fluent speech NEURO: no focal motor/sensory deficits  LABORATORY DATA:  I have reviewed the data as listed Lab Results  Component Value Date   WBC 6.9 07/28/2020   HGB 9.8 (L) 07/28/2020   HCT 36.2 07/28/2020   MCV 67.4 (L) 07/28/2020   PLT 327 07/28/2020      Chemistry      Component Value Date/Time   NA 140 03/18/2020 1119   K 4.2 03/18/2020 1119   CL 105 03/18/2020 1119   CO2 26 03/18/2020 1119   BUN 15 03/18/2020 1119   CREATININE 1.05 (H) 03/18/2020 1119      Component Value Date/Time   CALCIUM 9.1 03/18/2020 1119   ALKPHOS 47 03/18/2020 1119   AST 14 (L) 03/18/2020 1119   ALT 12 03/18/2020 1119   BILITOT 0.7 03/18/2020 1119     Reviewed endoscopy, colonoscopy and capsule endoscopy reports. Iron panel and ferritin consistent with IDA  RADIOGRAPHIC STUDIES: I have personally reviewed the radiological images as listed and agreed with the findings in the report. No results found.  All questions were answered. The patient knows to call the clinic with any problems, questions or concerns. I spent 45 minutes in the care of this patient including H and P, review of records, counseling and coordination of care.     Rachel Moulds, MD 08/18/2020 11:06 AM

## 2020-08-18 NOTE — Progress Notes (Signed)
Patient tolerated iron infusion with no complaints voiced.  Peripheral IV site clean and dry with good blood return noted before and after infusion.  Band aid applied.  VSS with discharge and left in satisfactory condition with no s/s of distress noted.   

## 2020-08-18 NOTE — Patient Instructions (Signed)
[facility] at [facility]  Discharge Instructions:   _______________________________________________________________  Thank you for choosing [facility] at [facility] to provide your oncology and hematology care.  To afford each patient quality time with our providers, please arrive at least 15 minutes before your scheduled appointment.  You need to re-schedule your appointment if you arrive 10 or more minutes late.  We strive to give you quality time with our providers, and arriving late affects you and other patients whose appointments are after yours.  Also, if you no show three or more times for appointments you may be dismissed from the clinic.  Again, thank you for choosing  [facility] at [facility]. Our hope is that these requests will allow you access to exceptional care and in a timely manner. _______________________________________________________________  If you have questions after your visit, please contact our office at [phone] between the hours of 8:30 a.m. and 5:00 p.m. Voicemails left after 4:30 p.m. will not be returned until the following business day. _______________________________________________________________  For prescription refill requests, have your pharmacy contact our office. _______________________________________________________________  Recommendations made by the consultant and any test results will be sent to your referring physician. _______________________________________________________________ 

## 2020-08-21 ENCOUNTER — Inpatient Hospital Stay (HOSPITAL_COMMUNITY): Payer: Medicare PPO

## 2020-08-21 VITALS — BP 154/73 | HR 52 | Temp 97.2°F | Resp 18 | Wt 190.8 lb

## 2020-08-21 DIAGNOSIS — D500 Iron deficiency anemia secondary to blood loss (chronic): Secondary | ICD-10-CM

## 2020-08-21 DIAGNOSIS — E785 Hyperlipidemia, unspecified: Secondary | ICD-10-CM | POA: Diagnosis not present

## 2020-08-21 DIAGNOSIS — Z803 Family history of malignant neoplasm of breast: Secondary | ICD-10-CM | POA: Diagnosis not present

## 2020-08-21 DIAGNOSIS — Z87891 Personal history of nicotine dependence: Secondary | ICD-10-CM | POA: Diagnosis not present

## 2020-08-21 DIAGNOSIS — Z90710 Acquired absence of both cervix and uterus: Secondary | ICD-10-CM | POA: Diagnosis not present

## 2020-08-21 DIAGNOSIS — Q2730 Arteriovenous malformation, site unspecified: Secondary | ICD-10-CM | POA: Diagnosis not present

## 2020-08-21 DIAGNOSIS — I10 Essential (primary) hypertension: Secondary | ICD-10-CM | POA: Diagnosis not present

## 2020-08-21 MED ORDER — SODIUM CHLORIDE 0.9 % IV SOLN: Freq: Once | INTRAVENOUS | Status: AC

## 2020-08-21 MED ORDER — ACETAMINOPHEN 325 MG PO TABS
ORAL_TABLET | ORAL | Status: AC
  Filled 2020-08-21: qty 2

## 2020-08-21 MED ORDER — LORATADINE 10 MG PO TABS
10.0000 mg | ORAL_TABLET | Freq: Once | ORAL | Status: AC
  Administered 2020-08-21: 10 mg via ORAL

## 2020-08-21 MED ORDER — FAMOTIDINE 20 MG PO TABS
20.0000 mg | ORAL_TABLET | Freq: Once | ORAL | Status: AC
Start: 2020-08-21 — End: 2020-08-21
  Administered 2020-08-21: 20 mg via ORAL

## 2020-08-21 MED ORDER — FAMOTIDINE 20 MG PO TABS
ORAL_TABLET | ORAL | Status: AC
  Filled 2020-08-21: qty 1

## 2020-08-21 MED ORDER — LORATADINE 10 MG PO TABS
ORAL_TABLET | ORAL | Status: AC
  Filled 2020-08-21: qty 1

## 2020-08-21 MED ORDER — SODIUM CHLORIDE 0.9 % IV SOLN
200.0000 mg | Freq: Once | INTRAVENOUS | Status: AC
  Administered 2020-08-21: 200 mg via INTRAVENOUS
  Filled 2020-08-21: qty 10

## 2020-08-21 MED ORDER — ACETAMINOPHEN 325 MG PO TABS
650.0000 mg | ORAL_TABLET | Freq: Once | ORAL | Status: AC
  Administered 2020-08-21: 650 mg via ORAL

## 2020-08-21 NOTE — Progress Notes (Signed)
[name] presents today for IV Venofer infusion per MDs orders in supportive therapy plan. Vitals have been reviewed and are stable and within parameters for infusion today. Infusion given without incident or complaint. Patient observed for 30 minutes post infusion per orders. VSS upon completion of infusion, IV flushed and removed per protocol, see MAR and IV flowsheet for details. Discharged in satisfactory condition with follow up instructions.

## 2020-08-21 NOTE — Patient Instructions (Signed)
[facility] at [facility]  Discharge Instructions:  Iron Sucrose injection What is this medicine? IRON SUCROSE (AHY ern SOO krohs) is an iron complex. Iron is used to make healthy red blood cells, which carry oxygen and nutrients throughout the body. This medicine is used to treat iron deficiency anemia in people with chronic kidney disease. This medicine may be used for other purposes; ask your health care provider or pharmacist if you have questions. COMMON BRAND NAME(S): Venofer What should I tell my health care provider before I take this medicine? They need to know if you have any of these conditions:  anemia not caused by low iron levels  heart disease  high levels of iron in the blood  kidney disease  liver disease  an unusual or allergic reaction to iron, other medicines, foods, dyes, or preservatives  pregnant or trying to get pregnant  breast-feeding How should I use this medicine? This medicine is for infusion into a vein. It is given by a health care professional in a hospital or clinic setting. Talk to your pediatrician regarding the use of this medicine in children. While this drug may be prescribed for children as young as 2 years for selected conditions, precautions do apply. Overdosage: If you think you have taken too much of this medicine contact a poison control center or emergency room at once. NOTE: This medicine is only for you. Do not share this medicine with others. What if I miss a dose? It is important not to miss your dose. Call your doctor or health care professional if you are unable to keep an appointment. What may interact with this medicine? Do not take this medicine with any of the following medications:  deferoxamine  dimercaprol  other iron products This medicine may also interact with the following medications:  chloramphenicol  deferasirox This list may not describe all possible interactions. Give your health  care provider a list of all the medicines, herbs, non-prescription drugs, or dietary supplements you use. Also tell them if you smoke, drink alcohol, or use illegal drugs. Some items may interact with your medicine. What should I watch for while using this medicine? Visit your doctor or healthcare professional regularly. Tell your doctor or healthcare professional if your symptoms do not start to get better or if they get worse. You may need blood work done while you are taking this medicine. You may need to follow a special diet. Talk to your doctor. Foods that contain iron include: whole grains/cereals, dried fruits, beans, or peas, leafy green vegetables, and organ meats (liver, kidney). What side effects may I notice from receiving this medicine? Side effects that you should report to your doctor or health care professional as soon as possible:  allergic reactions like skin rash, itching or hives, swelling of the face, lips, or tongue  breathing problems  changes in blood pressure  cough  fast, irregular heartbeat  feeling faint or lightheaded, falls  fever or chills  flushing, sweating, or hot feelings  joint or muscle aches/pains  seizures  swelling of the ankles or feet  unusually weak or tired Side effects that usually do not require medical attention (report to your doctor or health care professional if they continue or are bothersome):  diarrhea  feeling achy  headache  irritation at site where injected  nausea, vomiting  stomach upset  tiredness This list may not describe all possible side effects. Call your doctor for medical advice about side effects. You   may report side effects to FDA at 1-800-FDA-1088. Where should I keep my medicine? This drug is given in a hospital or clinic and will not be stored at home. NOTE: This sheet is a summary. It may not cover all possible information. If you have questions about this medicine, talk to your doctor,  pharmacist, or health care provider.  2021 Elsevier/Gold Standard (2011-02-21 17:14:35)  _______________________________________________________________  Thank you for choosing [facility] at [facility] to provide your oncology and hematology care.  To afford each patient quality time with our providers, please arrive at least 15 minutes before your scheduled appointment.  You need to re-schedule your appointment if you arrive 10 or more minutes late.  We strive to give you quality time with our providers, and arriving late affects you and other patients whose appointments are after yours.  Also, if you no show three or more times for appointments you may be dismissed from the clinic.  Again, thank you for choosing [facility] at [facility]. Our hope is that these requests will allow you access to exceptional care and in a timely manner. _______________________________________________________________  If you have questions after your visit, please contact our office at [phone] between the hours of 8:30 a.m. and 5:00 p.m. Voicemails left after 4:30 p.m. will not be returned until the following business day. _______________________________________________________________  For prescription refill requests, have your pharmacy contact our office. _______________________________________________________________  Recommendations made by the consultant and any test results will be sent to your referring physician. _______________________________________________________________ 

## 2020-08-22 ENCOUNTER — Ambulatory Visit: Payer: Medicare PPO | Admitting: Nurse Practitioner

## 2020-08-22 ENCOUNTER — Telehealth: Payer: Self-pay | Admitting: Internal Medicine

## 2020-08-22 NOTE — Telephone Encounter (Signed)
Pt has a follow up appointment scheduled for today for her anemia. She is confused to why she has this appointment and said that she has had a colonoscopy, EGD, labs and was at the hospital yesterday and she doesn't have a problem with gas. I told her it was for her anemia but she doesn't understand. I cancelled her OV per her request and she asked for the nurse to call her back to explain why she needed to come here. [phone]

## 2020-08-24 NOTE — Telephone Encounter (Signed)
Noted, thanks for the update 

## 2020-08-24 NOTE — Telephone Encounter (Signed)
Phoned and spoke with the pt and she stated to me that it cost her 20.00 in gas to come then 35.00 copay, and her furnace just blew up and they just quoted her a bill for 5700.00 to replace so therefore @this  particular time she cannot do a office visit with . Pt also advised me she is feeling fine but if she begins to have problems she will call us back.

## 2020-09-21 DIAGNOSIS — I10 Essential (primary) hypertension: Secondary | ICD-10-CM | POA: Diagnosis not present

## 2020-09-21 DIAGNOSIS — I2510 Atherosclerotic heart disease of native coronary artery without angina pectoris: Secondary | ICD-10-CM | POA: Diagnosis not present

## 2020-10-05 ENCOUNTER — Other Ambulatory Visit: Payer: Self-pay

## 2020-10-05 ENCOUNTER — Inpatient Hospital Stay (HOSPITAL_COMMUNITY): Payer: Medicare PPO | Attending: Hematology

## 2020-10-05 DIAGNOSIS — D500 Iron deficiency anemia secondary to blood loss (chronic): Secondary | ICD-10-CM | POA: Insufficient documentation

## 2020-10-05 DIAGNOSIS — Q2730 Arteriovenous malformation, site unspecified: Secondary | ICD-10-CM | POA: Insufficient documentation

## 2020-10-05 LAB — CBC WITH DIFFERENTIAL/PLATELET
Abs Immature Granulocytes: 0.03 10*3/uL (ref 0.00–0.07)
Basophils Absolute: 0.1 10*3/uL (ref 0.0–0.1)
Basophils Relative: 1 %
Eosinophils Absolute: 0.5 10*3/uL (ref 0.0–0.5)
Eosinophils Relative: 5 %
HCT: 50 % — ABNORMAL HIGH (ref 36.0–46.0)
Hemoglobin: 15.1 g/dL — ABNORMAL HIGH (ref 12.0–15.0)
Immature Granulocytes: 0 %
Lymphocytes Relative: 19 %
Lymphs Abs: 1.7 10*3/uL (ref 0.7–4.0)
MCH: 23.6 pg — ABNORMAL LOW (ref 26.0–34.0)
MCHC: 30.2 g/dL (ref 30.0–36.0)
MCV: 78.1 fL — ABNORMAL LOW (ref 80.0–100.0)
Monocytes Absolute: 0.6 10*3/uL (ref 0.1–1.0)
Monocytes Relative: 6 %
Neutro Abs: 6 10*3/uL (ref 1.7–7.7)
Neutrophils Relative %: 69 %
Platelets: 323 10*3/uL (ref 150–400)
RBC: 6.4 MIL/uL — ABNORMAL HIGH (ref 3.87–5.11)
RDW: 26.8 % — ABNORMAL HIGH (ref 11.5–15.5)
WBC: 8.9 10*3/uL (ref 4.0–10.5)
nRBC: 0 % (ref 0.0–0.2)

## 2020-10-05 LAB — IRON AND TIBC
Iron: 49 ug/dL (ref 28–170)
Saturation Ratios: 11 % (ref 10.4–31.8)
TIBC: 437 ug/dL (ref 250–450)
UIBC: 388 ug/dL

## 2020-10-05 LAB — FERRITIN: Ferritin: 31 ng/mL (ref 11–307)

## 2020-10-09 ENCOUNTER — Telehealth: Payer: Self-pay

## 2020-10-09 NOTE — Telephone Encounter (Signed)
-----   Message from Rachel Moulds, MD sent at 10/07/2020  1:05 PM EDT ----- Labs show improvement in iron and blood work. Iron transfusion has helped her. She should come back for FU in about 4 months.  Thanks,

## 2020-10-09 NOTE — Telephone Encounter (Signed)
Spoke with patient in regards to her recent lab results. Labs had improved since iron transfusion.  Patient made aware of follow-up appointment with Dr. Al Pimple in September. Patient had no further questions.

## 2020-10-12 ENCOUNTER — Telehealth: Payer: Self-pay | Admitting: Hematology and Oncology

## 2020-10-12 ENCOUNTER — Ambulatory Visit (HOSPITAL_COMMUNITY): Payer: Medicare PPO | Admitting: Physician Assistant

## 2020-10-12 NOTE — Telephone Encounter (Signed)
Scheduled appt per 5/14 sch msg. Pt aware.

## 2020-10-21 DIAGNOSIS — I10 Essential (primary) hypertension: Secondary | ICD-10-CM | POA: Diagnosis not present

## 2020-10-21 DIAGNOSIS — E785 Hyperlipidemia, unspecified: Secondary | ICD-10-CM | POA: Diagnosis not present

## 2020-11-13 ENCOUNTER — Other Ambulatory Visit: Payer: Self-pay

## 2020-11-13 ENCOUNTER — Other Ambulatory Visit (HOSPITAL_COMMUNITY)
Admission: RE | Admit: 2020-11-13 | Discharge: 2020-11-13 | Disposition: A | Discharge status: Home / Self Care | Payer: Medicare PPO | Source: Ambulatory Visit | Attending: Internal Medicine | Admitting: Internal Medicine

## 2020-11-13 DIAGNOSIS — Z0001 Encounter for general adult medical examination with abnormal findings: Secondary | ICD-10-CM | POA: Diagnosis not present

## 2020-11-13 DIAGNOSIS — I10 Essential (primary) hypertension: Secondary | ICD-10-CM | POA: Insufficient documentation

## 2020-11-13 DIAGNOSIS — I2510 Atherosclerotic heart disease of native coronary artery without angina pectoris: Secondary | ICD-10-CM | POA: Diagnosis not present

## 2020-11-13 DIAGNOSIS — Z1389 Encounter for screening for other disorder: Secondary | ICD-10-CM | POA: Diagnosis not present

## 2020-11-13 DIAGNOSIS — K219 Gastro-esophageal reflux disease without esophagitis: Secondary | ICD-10-CM | POA: Diagnosis not present

## 2020-11-13 DIAGNOSIS — E785 Hyperlipidemia, unspecified: Secondary | ICD-10-CM | POA: Insufficient documentation

## 2020-11-13 DIAGNOSIS — Z1331 Encounter for screening for depression: Secondary | ICD-10-CM | POA: Diagnosis not present

## 2020-11-13 LAB — CBC
HCT: 52.1 % — ABNORMAL HIGH (ref 36.0–46.0)
Hemoglobin: 15.9 g/dL — ABNORMAL HIGH (ref 12.0–15.0)
MCH: 24.5 pg — ABNORMAL LOW (ref 26.0–34.0)
MCHC: 30.5 g/dL (ref 30.0–36.0)
MCV: 80.2 fL (ref 80.0–100.0)
Platelets: 317 10*3/uL (ref 150–400)
RBC: 6.5 MIL/uL — ABNORMAL HIGH (ref 3.87–5.11)
RDW: 20.7 % — ABNORMAL HIGH (ref 11.5–15.5)
WBC: 7.5 10*3/uL (ref 4.0–10.5)
nRBC: 0 % (ref 0.0–0.2)

## 2020-11-13 LAB — LIPID PANEL
Cholesterol: 170 mg/dL (ref 0–200)
HDL: 58 mg/dL (ref >40)
LDL Cholesterol: 84 mg/dL (ref 0–99)
Total CHOL/HDL Ratio: 2.9 RATIO
Triglycerides: 140 mg/dL (ref <150)
VLDL: 28 mg/dL (ref 0–40)

## 2020-11-13 LAB — HEPATIC FUNCTION PANEL
ALT: 16 U/L (ref 0–44)
AST: 17 U/L (ref 15–41)
Albumin: 4.3 g/dL (ref 3.5–5.0)
Alkaline Phosphatase: 86 U/L (ref 38–126)
Bilirubin, Direct: 0.1 mg/dL (ref 0.0–0.2)
Indirect Bilirubin: 0.4 mg/dL (ref 0.3–0.9)
Total Bilirubin: 0.5 mg/dL (ref 0.3–1.2)
Total Protein: 7.5 g/dL (ref 6.5–8.1)

## 2020-11-13 LAB — BASIC METABOLIC PANEL
Anion gap: 8 (ref 5–15)
BUN: 14 mg/dL (ref 8–23)
CO2: 26 mmol/L (ref 22–32)
Calcium: 9.7 mg/dL (ref 8.9–10.3)
Chloride: 106 mmol/L (ref 98–111)
Creatinine, Ser: 0.9 mg/dL (ref 0.44–1.00)
GFR, Estimated: >60 mL/min (ref >60)
Glucose, Bld: 87 mg/dL (ref 70–99)
Potassium: 4.1 mmol/L (ref 3.5–5.1)
Sodium: 140 mmol/L (ref 135–145)

## 2020-12-13 DIAGNOSIS — I10 Essential (primary) hypertension: Secondary | ICD-10-CM | POA: Diagnosis not present

## 2020-12-13 DIAGNOSIS — I2510 Atherosclerotic heart disease of native coronary artery without angina pectoris: Secondary | ICD-10-CM | POA: Diagnosis not present

## 2021-01-13 DIAGNOSIS — I2510 Atherosclerotic heart disease of native coronary artery without angina pectoris: Secondary | ICD-10-CM | POA: Diagnosis not present

## 2021-01-13 DIAGNOSIS — I10 Essential (primary) hypertension: Secondary | ICD-10-CM | POA: Diagnosis not present

## 2021-02-08 ENCOUNTER — Telehealth: Payer: Self-pay

## 2021-02-08 ENCOUNTER — Inpatient Hospital Stay: Payer: Medicare PPO | Attending: Hematology and Oncology | Admitting: Hematology and Oncology

## 2021-02-08 NOTE — Telephone Encounter (Signed)
Attempted to call patient regarding missed appointment from today, no answer. Scheduling message sent. 

## 2021-02-13 DIAGNOSIS — I2510 Atherosclerotic heart disease of native coronary artery without angina pectoris: Secondary | ICD-10-CM | POA: Diagnosis not present

## 2021-02-13 DIAGNOSIS — I10 Essential (primary) hypertension: Secondary | ICD-10-CM | POA: Diagnosis not present

## 2021-03-05 ENCOUNTER — Other Ambulatory Visit (HOSPITAL_COMMUNITY): Payer: Self-pay | Admitting: *Deleted

## 2021-03-05 DIAGNOSIS — D500 Iron deficiency anemia secondary to blood loss (chronic): Secondary | ICD-10-CM

## 2021-03-06 ENCOUNTER — Other Ambulatory Visit: Payer: Self-pay

## 2021-03-06 ENCOUNTER — Inpatient Hospital Stay (HOSPITAL_COMMUNITY): Payer: Medicare PPO | Attending: Hematology

## 2021-03-06 DIAGNOSIS — Z7982 Long term (current) use of aspirin: Secondary | ICD-10-CM | POA: Diagnosis not present

## 2021-03-06 DIAGNOSIS — J449 Chronic obstructive pulmonary disease, unspecified: Secondary | ICD-10-CM | POA: Insufficient documentation

## 2021-03-06 DIAGNOSIS — K2970 Gastritis, unspecified, without bleeding: Secondary | ICD-10-CM | POA: Diagnosis not present

## 2021-03-06 DIAGNOSIS — K449 Diaphragmatic hernia without obstruction or gangrene: Secondary | ICD-10-CM | POA: Insufficient documentation

## 2021-03-06 DIAGNOSIS — I10 Essential (primary) hypertension: Secondary | ICD-10-CM | POA: Diagnosis not present

## 2021-03-06 DIAGNOSIS — I2510 Atherosclerotic heart disease of native coronary artery without angina pectoris: Secondary | ICD-10-CM | POA: Insufficient documentation

## 2021-03-06 DIAGNOSIS — Z800 Family history of malignant neoplasm of digestive organs: Secondary | ICD-10-CM | POA: Insufficient documentation

## 2021-03-06 DIAGNOSIS — D751 Secondary polycythemia: Secondary | ICD-10-CM | POA: Diagnosis not present

## 2021-03-06 DIAGNOSIS — Z803 Family history of malignant neoplasm of breast: Secondary | ICD-10-CM | POA: Insufficient documentation

## 2021-03-06 DIAGNOSIS — Z79899 Other long term (current) drug therapy: Secondary | ICD-10-CM | POA: Diagnosis not present

## 2021-03-06 DIAGNOSIS — D500 Iron deficiency anemia secondary to blood loss (chronic): Secondary | ICD-10-CM

## 2021-03-06 DIAGNOSIS — Z87891 Personal history of nicotine dependence: Secondary | ICD-10-CM | POA: Insufficient documentation

## 2021-03-06 LAB — CBC WITH DIFFERENTIAL/PLATELET
Abs Immature Granulocytes: 0.05 10*3/uL (ref 0.00–0.07)
Basophils Absolute: 0.1 10*3/uL (ref 0.0–0.1)
Basophils Relative: 1 %
Eosinophils Absolute: 0.6 10*3/uL — ABNORMAL HIGH (ref 0.0–0.5)
Eosinophils Relative: 7 %
HCT: 51.4 % — ABNORMAL HIGH (ref 36.0–46.0)
Hemoglobin: 16.1 g/dL — ABNORMAL HIGH (ref 12.0–15.0)
Immature Granulocytes: 1 %
Lymphocytes Relative: 20 %
Lymphs Abs: 1.7 10*3/uL (ref 0.7–4.0)
MCH: 26.8 pg (ref 26.0–34.0)
MCHC: 31.3 g/dL (ref 30.0–36.0)
MCV: 85.5 fL (ref 80.0–100.0)
Monocytes Absolute: 0.5 10*3/uL (ref 0.1–1.0)
Monocytes Relative: 6 %
Neutro Abs: 5.8 10*3/uL (ref 1.7–7.7)
Neutrophils Relative %: 65 %
Platelets: 333 10*3/uL (ref 150–400)
RBC: 6.01 MIL/uL — ABNORMAL HIGH (ref 3.87–5.11)
RDW: 16 % — ABNORMAL HIGH (ref 11.5–15.5)
WBC: 8.6 10*3/uL (ref 4.0–10.5)
nRBC: 0 % (ref 0.0–0.2)

## 2021-03-06 LAB — FERRITIN: Ferritin: 10 ng/mL — ABNORMAL LOW (ref 11–307)

## 2021-03-06 LAB — IRON AND TIBC
Iron: 50 ug/dL (ref 28–170)
Saturation Ratios: 12 % (ref 10.4–31.8)
TIBC: 430 ug/dL (ref 250–450)
UIBC: 380 ug/dL

## 2021-03-12 NOTE — Progress Notes (Signed)
[facility] [address], [place] [address]   CLINIC:  Medical Oncology/Hematology  PCP:  Avon Gully, MD [address] [place] [address] [phone]   REASON FOR VISIT:  Follow-up for iron deficiency anemia  CURRENT THERAPY: IV iron (last given Venofer 1000 mg in divided doses from 08/07/2020 through 08/21/2020)  INTERVAL HISTORY:  [name] 75 y.o. female returns for routine follow-up of her iron deficiency anemia.  She was last seen by Dr. Al Pimple on 08/18/2020.  Last dose of IV Venofer was on 08/21/2020.  At today's visit, she reports feeling well since her last visit.  No recent hospitalizations, surgeries, or changes in baseline health status.  Patient reports that she feels better after her IV iron in March 2022 with improved energy and overall improved state of being.  She denies any fatigue, pica, restless legs, chest pain, dyspnea on exertion, lightheadedness, or syncope.  She has not noted any major bleeding events such as hematemesis, hematochezia, melena, or hematuria.  She does have occasional scant blood on toilet paper after she wipes.  Although her anemia has resolved, she was found on most recent labs to have erythrocytosis. She does not know of any definite car monoxide exposure, but she does live in a single wide 80 foot trailer with electric utilities; she does not use the wood-burning stove. She denies any history of obstructive sleep apnea.  She does not smoke.  She is not taking any diuretic medications.  She denies any symptoms of erythromelalgia, aquagenic pruritus, vasomotor symptoms, headache, strokelike symptoms, or dizziness.  She is not aware of any family history of blood disorders.  She has 100% energy and 100% appetite. She endorses that she is maintaining a stable weight.    REVIEW OF SYSTEMS:  Review of Systems  Constitutional:  Negative for appetite change, chills, diaphoresis, fatigue, fever and unexpected  weight change.  HENT:   Positive for trouble swallowing (Difficulty chewing). Negative for lump/mass and nosebleeds.   Eyes:  Negative for eye problems.  Respiratory:  Negative for cough, hemoptysis and shortness of breath.   Cardiovascular:  Negative for chest pain, leg swelling and palpitations.  Gastrointestinal:  Positive for diarrhea (Occasional) and nausea (Occasional). Negative for abdominal pain, blood in stool, constipation and vomiting.  Genitourinary:  Negative for hematuria.   Skin: Negative.   Neurological:  Negative for dizziness, headaches and light-headedness.  Hematological:  Does not bruise/bleed easily.  Psychiatric/Behavioral:  Positive for sleep disturbance.      PAST MEDICAL/SURGICAL HISTORY:  Past Medical History:  Diagnosis Date   Anxiety    CAD (coronary artery disease)    Multivessel status post CABG 1998   Cholelithiasis    Chronic back pain    Colitis, ischemic (HCC) 03/2011   COPD (chronic obstructive pulmonary disease) (HCC)    Diverticulosis    Essential hypertension    GERD (gastroesophageal reflux disease)    GI bleed 03/2011   Hiatal hernia    Multiple pulmonary nodules 03/2011   Nephrolithiasis    Past Surgical History:  Procedure Laterality Date   ABDOMINAL HYSTERECTOMY     LSO   BACK SURGERY     BIOPSY  03/14/2020   Procedure: BIOPSY;  Surgeon: Lanelle Bal, DO;  [facility];  Service: Endoscopy;;   COLONOSCOPY  04/16/2011   Dr. Darrick Penna: moderate diverticulosis, small internal hemorrhoids, likely ischemic colitis   COLONOSCOPY WITH PROPOFOL N/A 03/14/2020   Procedure: COLONOSCOPY WITH PROPOFOL;  Surgeon:  Lanelle Bal, DO;  Location: [facility];  Service: Endoscopy;  Laterality: N/A;  1:00pm   CORONARY ARTERY BYPASS GRAFT  1998   CORONARY STENT PLACEMENT  1995   ESOPHAGOGASTRODUODENOSCOPY N/A 06/23/2013   Dr. Jena Gauss: cervical esophageal web likely s/p dilation with scope, non-critical Schatzki's ring, large hiatal  hernia with Sheria Lang lesions, chronic active gastritis   ESOPHAGOGASTRODUODENOSCOPY (EGD) WITH PROPOFOL N/A 03/14/2020   Procedure: ESOPHAGOGASTRODUODENOSCOPY (EGD) WITH PROPOFOL;  Surgeon: Lanelle Bal, DO;  [facility];  Service: Endoscopy;  Laterality: N/A;   GIVENS CAPSULE STUDY N/A 06/26/2020   Procedure: GIVENS CAPSULE STUDY;  Surgeon: Lanelle Bal, DO;  [facility]: [facility];  Service: Endoscopy;  Laterality: N/A;  7:30am   SALPINGOOPHORECTOMY Left    SMALL INTESTINE SURGERY     SPINAL FUSION     x3     SOCIAL HISTORY:  Social History   Socioeconomic History   Marital status: Divorced    Spouse name: Not on file   Number of children: 0   Years of education: Not on file   Highest education level: Not on file  Occupational History   Occupation: disabled    Employer: retired  Tobacco Use   Smoking status: Former    Packs/day: 1.00    Years: 40.00    Pack years: 40.00    Types: Cigarettes    Quit date: 09/14/2010    Years since quitting: 10.4   Smokeless tobacco: Never  Substance and Sexual Activity   Alcohol use: No   Drug use: No    Comment: marijuana (QUIT couple yrs ago)   Sexual activity: Not Currently  Other Topics Concern   Not on file  Social History Narrative   Lives alone   Social Determinants of Health   Financial Resource Strain: Not on file  Food Insecurity: Not on file  Transportation Needs: Not on file  Physical Activity: Not on file  Stress: Not on file  Social Connections: Not on file  Intimate Partner Violence: Not At Risk   Fear of Current or Ex-Partner: No   Emotionally Abused: No   Physically Abused: No   Sexually Abused: No    FAMILY HISTORY:  Family History  Problem Relation Age of Onset   Stomach cancer Mother 32   Breast cancer Sister 74   Diabetes Mellitus II Sister    Colon cancer Neg Hx    Esophageal cancer Neg Hx     CURRENT MEDICATIONS:  Outpatient Encounter Medications as of 03/13/2021   Medication Sig   amLODipine (NORVASC) 5 MG tablet    aspirin EC 81 MG tablet Take 81 mg by mouth daily. Swallow whole.   atorvastatin (LIPITOR) 40 MG tablet Take 40 mg by mouth at bedtime.    cholecalciferol (VITAMIN D) 1000 UNITS tablet Take 1,000 Units by mouth daily.   Ferrous Sulfate Dried (FERROUS SULFATE CR PO) Take 150 mg by mouth at bedtime.    loratadine (CLARITIN) 10 MG tablet Take 10 mg by mouth daily.   metoprolol succinate (TOPROL-XL) 25 MG 24 hr tablet Take 25 mg by mouth 2 (two) times daily.   pantoprazole (PROTONIX) 40 MG tablet Take 1 tablet (40 mg total) by mouth 2 (two) times daily. (Patient taking differently: Take 40 mg by mouth daily.)   [DISCONTINUED] lisinopril (PRINIVIL,ZESTRIL) 10 MG tablet Take 10 mg by mouth daily.     No facility-administered encounter medications on file as of 03/13/2021.    ALLERGIES:  Allergies  Allergen Reactions   Lisinopril Swelling    Angioedema, pharyx after endoscopy   Omnipaque [Iohexol] Other (See Comments)    blisters   Penicillins Other (See Comments)    Unknown/ Reaction 25 years     PHYSICAL EXAM:  ECOG PERFORMANCE STATUS: 1 - Symptomatic but completely ambulatory  There were no vitals filed for this visit. There were no vitals filed for this visit. Physical Exam Constitutional:      Appearance: Normal appearance. She is obese.  HENT:     Head: Normocephalic and atraumatic.     Mouth/Throat:     Mouth: Mucous membranes are moist.  Eyes:     Extraocular Movements: Extraocular movements intact.     Pupils: Pupils are equal, round, and reactive to light.  Cardiovascular:     Rate and Rhythm: Normal rate and regular rhythm.     Pulses: Normal pulses.     Heart sounds: Normal heart sounds.  Pulmonary:     Effort: Pulmonary effort is normal.     Breath sounds: Normal breath sounds.  Abdominal:     General: Bowel sounds are normal.     Palpations: Abdomen is soft.     Tenderness: There is no abdominal  tenderness.  Musculoskeletal:        General: No swelling.     Right lower leg: No edema.     Left lower leg: No edema.  Lymphadenopathy:     Cervical: No cervical adenopathy.  Skin:    General: Skin is warm and dry.  Neurological:     General: No focal deficit present.     Mental Status: She is alert and oriented to person, place, and time.  Psychiatric:        Mood and Affect: Mood normal.        Behavior: Behavior normal.     LABORATORY DATA:  I have reviewed the labs as listed.  CBC    Component Value Date/Time   WBC 8.6 03/06/2021 1001   RBC 6.01 (H) 03/06/2021 1001   HGB 16.1 (H) 03/06/2021 1001   HCT 51.4 (H) 03/06/2021 1001   PLT 333 03/06/2021 1001   MCV 85.5 03/06/2021 1001   MCH 26.8 03/06/2021 1001   MCHC 31.3 03/06/2021 1001   RDW 16.0 (H) 03/06/2021 1001   LYMPHSABS 1.7 03/06/2021 1001   MONOABS 0.5 03/06/2021 1001   EOSABS 0.6 (H) 03/06/2021 1001   BASOSABS 0.1 03/06/2021 1001   CMP Latest Ref Rng & Units 11/13/2020 03/18/2020 11/18/2019  Glucose 70 - 99 mg/dL 87 97 937(T)  BUN 8 - 23 mg/dL 14 15 02(I)  Creatinine 0.44 - 1.00 mg/dL 0.97 3.53(G) 9.92(E)  Sodium 135 - 145 mmol/L 140 140 138  Potassium 3.5 - 5.1 mmol/L 4.1 4.2 4.0  Chloride 98 - 111 mmol/L 106 105 104  CO2 22 - 32 mmol/L 26 26 23   Calcium 8.9 - 10.3 mg/dL 9.7 9.1 9.5  Total Protein 6.5 - 8.1 g/dL 7.5 6.8 7.6  Total Bilirubin 0.3 - 1.2 mg/dL 0.5 0.7 0.7  Alkaline Phos 38 - 126 U/L 86 47 57  AST 15 - 41 U/L 17 14(L) 16  ALT 0 - 44 U/L 16 12 10     DIAGNOSTIC IMAGING:  I have independently reviewed the relevant imaging and discussed with the patient.  ASSESSMENT & PLAN: 1.  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  Ferritin 10, iron saturation 12% with TIBC 430. - PLAN: Anemia has resolved.  However, even though patient has persistent iron deficiency, she also has erythrocytosis.  Before giving any additional IV iron, we will proceed with work-up of erythrocytosis as below.  2.  Erythrocytosis - Most recent CBC (03/06/2021) shows erythrocytosis with hemoglobin 16.1 and hematocrit 51.4, which has been persistent for the past 4 to 5 months - Review of past labs shows onset of erythrocytosis with Hgb 15.1 on 10/05/2020.  She did previously have some isolated elevated Hgb in 2011 and 2012. - No history of OSA.  Non-smoker.  No history of diuretic use. - Unknown car monoxide exposure, but she is at risk due to living in a single wide trailer.  She uses electric appliances and does not use the wood-burning stove. - No erythromelalgia, aquagenic pruritus, or vasomotor symptoms. -She takes aspirin 81 mg daily for heart health. - PLAN: Check erythropoietin, carbon monoxide/carboxyhemoglobin, and JAK2 with reflex.  Phone visit in 2 to 3 weeks to discuss results.  3.  Other history - PMH: CAD s/p CABG, COPD, hypertension   PLAN SUMMARY & DISPOSITION: -Labs today - Phone visit in 3 weeks to discuss results  All questions were answered. The patient knows to call the clinic with any problems, questions or concerns.  Medical decision making: Moderate  Time spent on visit: I spent 20 minutes counseling the patient face to face. The total time spent in the appointment was 30 minutes and more than 50% was on counseling.   Carnella Guadalajara, PA-C  03/13/2021 1:52 PM

## 2021-03-13 ENCOUNTER — Inpatient Hospital Stay (HOSPITAL_COMMUNITY): Payer: Medicare PPO

## 2021-03-13 ENCOUNTER — Other Ambulatory Visit: Payer: Self-pay

## 2021-03-13 ENCOUNTER — Inpatient Hospital Stay (HOSPITAL_BASED_OUTPATIENT_CLINIC_OR_DEPARTMENT_OTHER): Payer: Medicare PPO | Admitting: Physician Assistant

## 2021-03-13 VITALS — BP 162/72 | HR 60 | Temp 97.4°F | Resp 18 | Wt 200.6 lb

## 2021-03-13 DIAGNOSIS — J449 Chronic obstructive pulmonary disease, unspecified: Secondary | ICD-10-CM | POA: Diagnosis not present

## 2021-03-13 DIAGNOSIS — D500 Iron deficiency anemia secondary to blood loss (chronic): Secondary | ICD-10-CM | POA: Diagnosis not present

## 2021-03-13 DIAGNOSIS — Z87891 Personal history of nicotine dependence: Secondary | ICD-10-CM | POA: Diagnosis not present

## 2021-03-13 DIAGNOSIS — Z7982 Long term (current) use of aspirin: Secondary | ICD-10-CM | POA: Diagnosis not present

## 2021-03-13 DIAGNOSIS — I2510 Atherosclerotic heart disease of native coronary artery without angina pectoris: Secondary | ICD-10-CM | POA: Diagnosis not present

## 2021-03-13 DIAGNOSIS — D751 Secondary polycythemia: Secondary | ICD-10-CM | POA: Diagnosis not present

## 2021-03-13 DIAGNOSIS — K2970 Gastritis, unspecified, without bleeding: Secondary | ICD-10-CM | POA: Diagnosis not present

## 2021-03-13 DIAGNOSIS — K449 Diaphragmatic hernia without obstruction or gangrene: Secondary | ICD-10-CM | POA: Diagnosis not present

## 2021-03-13 DIAGNOSIS — I10 Essential (primary) hypertension: Secondary | ICD-10-CM | POA: Diagnosis not present

## 2021-03-13 LAB — CARBOXYHEMOGLOBIN - COOX: Carboxyhemoglobin: 0.9 % (ref 0.5–1.5)

## 2021-03-13 NOTE — Patient Instructions (Signed)
[facility] Discharge Instructions  You were seen today by Rojelio Brenner PA-C for your anemia (low blood cells), which have now resolved.  However, your blood cells are now too high.  We need to check some different labs to find out why they are elevated.  LABS: Labs today before leaving the hospital (on first floor)  FOLLOW-UP APPOINTMENT: Phone visit in 3 weeks   Thank you for choosing [facility] at [facility] to provide your oncology and hematology care.  To afford each patient quality time with our provider, please arrive at least 15 minutes before your scheduled appointment time.   If you have a lab appointment with the Cancer Center please come in thru the Main Entrance and check in at the main information desk.  You need to re-schedule your appointment should you arrive 10 or more minutes late.  We strive to give you quality time with our providers, and arriving late affects you and other patients whose appointments are after yours.  Also, if you no show three or more times for appointments you may be dismissed from the clinic at the providers discretion.     Again, thank you for choosing [facility].  Our hope is that these requests will decrease the amount of time that you wait before being seen by our physicians.       _____________________________________________________________  Should you have questions after your visit to [facility], please contact our office at [phone] and follow the prompts.  Our office hours are 8:00 a.m. and 4:30 p.m. Monday - Friday.  Please note that voicemails left after 4:00 p.m. may not be returned until the following business day.  We are closed weekends and major holidays.  You do have access to a nurse 24-7, just call the main number to the clinic [phone] and do not press any options, hold on the line and a nurse will answer the phone.    For  prescription refill requests, have your pharmacy contact our office and allow 72 hours.    Due to Covid, you will need to wear a mask upon entering the hospital. If you do not have a mask, a mask will be given to you at the Main Entrance upon arrival. For doctor visits, patients may have 1 support person age 77 or older with them. For treatment visits, patients can not have anyone with them due to social distancing guidelines and our immunocompromised population.

## 2021-03-14 LAB — ERYTHROPOIETIN: Erythropoietin: 11.4 m[IU]/mL (ref 2.6–18.5)

## 2021-03-14 LAB — CARBON MONOXIDE, BLOOD (PERFORMED AT REF LAB): Carbon Monoxide, Blood: 4.3 % — ABNORMAL HIGH (ref 0.0–3.6)

## 2021-03-15 DIAGNOSIS — I10 Essential (primary) hypertension: Secondary | ICD-10-CM | POA: Diagnosis not present

## 2021-03-15 DIAGNOSIS — I2510 Atherosclerotic heart disease of native coronary artery without angina pectoris: Secondary | ICD-10-CM | POA: Diagnosis not present

## 2021-03-27 LAB — CALR + JAK2 E12-15 + MPL (REFLEXED)

## 2021-03-27 LAB — JAK2 V617F, W REFLEX TO CALR/E12/MPL

## 2021-04-05 ENCOUNTER — Other Ambulatory Visit: Payer: Self-pay

## 2021-04-05 ENCOUNTER — Inpatient Hospital Stay (HOSPITAL_COMMUNITY): Payer: Medicare PPO | Attending: Hematology | Admitting: Physician Assistant

## 2021-04-05 DIAGNOSIS — D500 Iron deficiency anemia secondary to blood loss (chronic): Secondary | ICD-10-CM | POA: Diagnosis not present

## 2021-04-05 DIAGNOSIS — D751 Secondary polycythemia: Secondary | ICD-10-CM

## 2021-04-05 DIAGNOSIS — K449 Diaphragmatic hernia without obstruction or gangrene: Secondary | ICD-10-CM | POA: Insufficient documentation

## 2021-04-05 DIAGNOSIS — K2970 Gastritis, unspecified, without bleeding: Secondary | ICD-10-CM | POA: Insufficient documentation

## 2021-04-05 NOTE — Progress Notes (Addendum)
Virtual Visit via Telephone Note [facility]  I connected with [name]  on 04/05/2021 at 3:34 PM by telephone and verified that I am speaking with the correct person using two identifiers.  Location: Patient: Home Provider: [facility]   I discussed the limitations, risks, security and privacy concerns of performing an evaluation and management service by telephone and the availability of in person appointments. I also discussed with the patient that there may be a patient responsible charge related to this service. The patient expressed understanding and agreed to proceed.   HISTORY OF PRESENT ILLNESS: [name] is contacted today for follow-up regarding her iron deficiency and erythrocytosis, which was noted on her most recent visit with Rojelio Brenner PA-C on 03/13/2021.  She has no new complaints to offer today, continues to report good energy and denies any signs or symptoms of blood loss.  No fatigue, chest pain, dyspnea on exertion.  No erythromelalgia, aquagenic pruritus, or vasomotor symptoms. No changes since her last visit. She reports 100% energy and 100% appetite.   OBSERVATIONS/OBJECTIVE: Review of Systems  Constitutional:  Negative for chills, diaphoresis, fever, malaise/fatigue and weight loss.  HENT:         Difficulty chewing  Respiratory:  Negative for cough and shortness of breath.   Cardiovascular:  Negative for chest pain and palpitations.  Gastrointestinal:  Negative for abdominal pain, blood in stool, melena, nausea and vomiting.  Genitourinary:  Positive for frequency.  Neurological:  Negative for dizziness and headaches.    PHYSICAL EXAM (per limitations of virtual telephone visit): The patient is alert and oriented x 3, exhibiting adequate mentation, good mood, and ability to speak in full sentences and execute sound judgement.   ASSESSMENT & PLAN: 1.  Iron deficiency anemia secondary to chronic blood loss - Initial  hematology consultation on 07/28/2020 - EGD (03/14/2020): Large hiatal hernia, gastritis - Colonoscopy (03/14/2020): Nonbleeding internal hemorrhoids, diverticulosis - Capsule endoscopy (06/26/2020): Significant for AVM and possible blood loss from AVM - Unable to tolerate oral iron tablets - Received IV Venofer 1000 mg in divided doses from 08/07/2020 through 08/21/2020 - She denies any hematemesis, hematochezia, or melena - Most recent labs (03/06/2021): Hgb 16.1 with hematocrit 51.4.  Ferritin 10, iron saturation 12% with TIBC 430. - PLAN: Anemia has resolved, but she has significant persistent iron deficiency. - Now that we have ruled out primary polycythemia as the cause of her erythrocytosis, we will proceed with iron repletion. - IV Venofer with 300 mg x 3 doses. - Repeat CBC and iron panel in 4 months.  RTC after labs.  2.  Erythrocytosis - Most recent CBC (03/06/2021) shows erythrocytosis with hemoglobin 16.1 and hematocrit 51.4, which has been persistent for the past 4 to 5 months - Review of past labs shows onset of erythrocytosis with Hgb 15.1 on 10/05/2020.  She did previously have some isolated elevated Hgb in 2011 and 2012. - No known history of OSA.  Non-smoker.  No history of diuretic use. - No erythromelalgia, aquagenic pruritus, or vasomotor symptoms. - She takes aspirin 81 mg daily for heart health. - Work-up of erythrocytosis was negative for JAK2, CALR, or MPL mutations. - Erythropoietin normal. - Carbon monoxide mildly elevated at 4.3 with normal carboxyhemoglobin.  She lives in a single wide trailer with electric utilities, denies any wood-burning stove's or gas appliances. -Discussed with patient that her elevated hemoglobin may be secondary to mild carbon monoxide exposure, recommended that she call local fire  department for carbon monoxide testing of her home. - Differential diagnosis also includes possibility for underlying sleep apnea, which she is at risk for due to her  obesity.  Recommended sleep study, but patient refused, stating that "there have just been too many tests lately" - PLAN: Continue to monitor CBC at follow-up visit in 4 months. - Recommended sleep study, which patient refused. - Recommended that patient reach out to local fire department for home carbon monoxide testing.   FOLLOW UP INSTRUCTIONS: IV Venofer 300 mg x 3 CBC, iron panel, and carbon monoxide with RTC in 4 months    I discussed the assessment and treatment plan with the patient. The patient was provided an opportunity to ask questions and all were answered. The patient agreed with the plan and demonstrated an understanding of the instructions.   The patient was advised to call back or seek an in-person evaluation if the symptoms worsen or if the condition fails to improve as anticipated.  I provided 22 minutes of non-face-to-face time during this encounter.   Harriett Rush, PA-C 04/05/2021 5:43 PM

## 2021-04-06 ENCOUNTER — Other Ambulatory Visit (HOSPITAL_COMMUNITY): Payer: Self-pay | Admitting: Internal Medicine

## 2021-04-06 DIAGNOSIS — Z1231 Encounter for screening mammogram for malignant neoplasm of breast: Secondary | ICD-10-CM

## 2021-04-12 ENCOUNTER — Inpatient Hospital Stay (HOSPITAL_COMMUNITY): Payer: Medicare PPO

## 2021-04-12 ENCOUNTER — Other Ambulatory Visit: Payer: Self-pay

## 2021-04-12 VITALS — BP 158/83 | HR 55 | Temp 97.4°F | Resp 20

## 2021-04-12 DIAGNOSIS — D500 Iron deficiency anemia secondary to blood loss (chronic): Secondary | ICD-10-CM | POA: Diagnosis not present

## 2021-04-12 DIAGNOSIS — K2970 Gastritis, unspecified, without bleeding: Secondary | ICD-10-CM | POA: Diagnosis not present

## 2021-04-12 DIAGNOSIS — K449 Diaphragmatic hernia without obstruction or gangrene: Secondary | ICD-10-CM | POA: Diagnosis not present

## 2021-04-12 MED ORDER — SODIUM CHLORIDE 0.9 % IV SOLN: Freq: Once | INTRAVENOUS | Status: AC

## 2021-04-12 MED ORDER — LORATADINE 10 MG PO TABS
10.0000 mg | ORAL_TABLET | Freq: Once | ORAL | Status: AC
  Administered 2021-04-12: 12:00:00 10 mg via ORAL
  Filled 2021-04-12: qty 1

## 2021-04-12 MED ORDER — ACETAMINOPHEN 325 MG PO TABS
650.0000 mg | ORAL_TABLET | Freq: Once | ORAL | Status: AC
  Administered 2021-04-12: 12:00:00 650 mg via ORAL
  Filled 2021-04-12: qty 2

## 2021-04-12 MED ORDER — SODIUM CHLORIDE 0.9 % IV SOLN
300.0000 mg | Freq: Once | INTRAVENOUS | Status: AC
  Administered 2021-04-12: 13:00:00 300 mg via INTRAVENOUS
  Filled 2021-04-12: qty 300

## 2021-04-12 NOTE — Patient Instructions (Signed)
[facility]  Discharge Instructions: Thank you for choosing [facility] to provide your oncology and hematology care.  If you have a lab appointment with the Cancer Center, please come in thru the Main Entrance and check in at the main information desk.  Wear comfortable clothing and clothing appropriate for easy access to any Portacath or PICC line.   We strive to give you quality time with your provider. You may need to reschedule your appointment if you arrive late (15 or more minutes).  Arriving late affects you and other patients whose appointments are after yours.  Also, if you miss three or more appointments without notifying the office, you may be dismissed from the clinic at the provider's discretion.      For prescription refill requests, have your pharmacy contact our office and allow 72 hours for refills to be completed.        To help prevent nausea and vomiting after your treatment, we encourage you to take your nausea medication as directed.  BELOW ARE SYMPTOMS THAT SHOULD BE REPORTED IMMEDIATELY: *FEVER GREATER THAN 100.4 F (38 C) OR HIGHER *CHILLS OR SWEATING *NAUSEA AND VOMITING THAT IS NOT CONTROLLED WITH YOUR NAUSEA MEDICATION *UNUSUAL SHORTNESS OF BREATH *UNUSUAL BRUISING OR BLEEDING *URINARY PROBLEMS (pain or burning when urinating, or frequent urination) *BOWEL PROBLEMS (unusual diarrhea, constipation, pain near the anus) TENDERNESS IN MOUTH AND THROAT WITH OR WITHOUT PRESENCE OF ULCERS (sore throat, sores in mouth, or a toothache) UNUSUAL RASH, SWELLING OR PAIN  UNUSUAL VAGINAL DISCHARGE OR ITCHING   Items with * indicate a potential emergency and should be followed up as soon as possible or go to the Emergency Department if any problems should occur.  Please show the CHEMOTHERAPY ALERT CARD or IMMUNOTHERAPY ALERT CARD at check-in to the Emergency Department and triage nurse.  Should you have questions after your visit or need to cancel  or reschedule your appointment, please contact [facility] [phone]  and follow the prompts.  Office hours are 8:00 a.m. to 4:30 p.m. Monday - Friday. Please note that voicemails left after 4:00 p.m. may not be returned until the following business day.  We are closed weekends and major holidays. You have access to a nurse at all times for urgent questions. Please call the main number to the clinic [phone] and follow the prompts.  For any non-urgent questions, you may also contact your provider using MyChart. We now offer e-Visits for anyone 18 and older to request care online for non-urgent symptoms. For details visit [email or web address].   Also download the MyChart app! Go to the app store, search "MyChart", open the app, select [facility], and log in with your MyChart username and password.  Due to Covid, a mask is required upon entering the hospital/clinic. If you do not have a mask, one will be given to you upon arrival. For doctor visits, patients may have 1 support person aged 18 or older with them. For treatment visits, patients cannot have anyone with them due to current Covid guidelines and our immunocompromised population.  

## 2021-04-12 NOTE — Progress Notes (Signed)
Iron infusion given per orders. Patient tolerated it well without problems. Vitals stable and discharged home from clinic ambulatory. Follow up as scheduled.  

## 2021-04-15 DIAGNOSIS — I10 Essential (primary) hypertension: Secondary | ICD-10-CM | POA: Diagnosis not present

## 2021-04-15 DIAGNOSIS — I2510 Atherosclerotic heart disease of native coronary artery without angina pectoris: Secondary | ICD-10-CM | POA: Diagnosis not present

## 2021-04-27 ENCOUNTER — Encounter (HOSPITAL_COMMUNITY): Payer: Self-pay

## 2021-04-27 ENCOUNTER — Inpatient Hospital Stay (HOSPITAL_COMMUNITY): Payer: Medicare PPO | Attending: Hematology

## 2021-04-27 ENCOUNTER — Ambulatory Visit (HOSPITAL_COMMUNITY)
Admission: RE | Admit: 2021-04-27 | Discharge: 2021-04-27 | Disposition: A | Discharge status: Home / Self Care | Payer: Medicare PPO | Source: Ambulatory Visit | Attending: Internal Medicine | Admitting: Internal Medicine

## 2021-04-27 ENCOUNTER — Other Ambulatory Visit: Payer: Self-pay

## 2021-04-27 VITALS — BP 133/73 | HR 52 | Temp 98.6°F | Resp 18

## 2021-04-27 DIAGNOSIS — E611 Iron deficiency: Secondary | ICD-10-CM | POA: Insufficient documentation

## 2021-04-27 DIAGNOSIS — Z1231 Encounter for screening mammogram for malignant neoplasm of breast: Secondary | ICD-10-CM | POA: Diagnosis not present

## 2021-04-27 MED ORDER — ACETAMINOPHEN 325 MG PO TABS
650.0000 mg | ORAL_TABLET | Freq: Once | ORAL | Status: AC
  Administered 2021-04-27: 650 mg via ORAL
  Filled 2021-04-27: qty 2

## 2021-04-27 MED ORDER — SODIUM CHLORIDE 0.9 % IV SOLN
300.0000 mg | Freq: Once | INTRAVENOUS | Status: AC
  Administered 2021-04-27: 300 mg via INTRAVENOUS
  Filled 2021-04-27: qty 300

## 2021-04-27 MED ORDER — SODIUM CHLORIDE 0.9 % IV SOLN
Freq: Once | INTRAVENOUS | Status: AC
Start: 2021-04-27 — End: 2021-04-27

## 2021-04-27 MED ORDER — LORATADINE 10 MG PO TABS
10.0000 mg | ORAL_TABLET | Freq: Once | ORAL | Status: AC
  Administered 2021-04-27: 10 mg via ORAL
  Filled 2021-04-27: qty 1

## 2021-04-27 NOTE — Patient Instructions (Signed)
[facility]  Discharge Instructions: Thank you for choosing [facility] to provide your oncology and hematology care.  If you have a lab appointment with the Cancer Center, please come in thru the Main Entrance and check in at the main information desk.  Wear comfortable clothing and clothing appropriate for easy access to any Portacath or PICC line.   We strive to give you quality time with your provider. You may need to reschedule your appointment if you arrive late (15 or more minutes).  Arriving late affects you and other patients whose appointments are after yours.  Also, if you miss three or more appointments without notifying the office, you may be dismissed from the clinic at the provider's discretion.      For prescription refill requests, have your pharmacy contact our office and allow 72 hours for refills to be completed.    Today you received the following: Venofer, return as scheduled.   To help prevent nausea and vomiting after your treatment, we encourage you to take your nausea medication as directed.  BELOW ARE SYMPTOMS THAT SHOULD BE REPORTED IMMEDIATELY: *FEVER GREATER THAN 100.4 F (38 C) OR HIGHER *CHILLS OR SWEATING *NAUSEA AND VOMITING THAT IS NOT CONTROLLED WITH YOUR NAUSEA MEDICATION *UNUSUAL SHORTNESS OF BREATH *UNUSUAL BRUISING OR BLEEDING *URINARY PROBLEMS (pain or burning when urinating, or frequent urination) *BOWEL PROBLEMS (unusual diarrhea, constipation, pain near the anus) TENDERNESS IN MOUTH AND THROAT WITH OR WITHOUT PRESENCE OF ULCERS (sore throat, sores in mouth, or a toothache) UNUSUAL RASH, SWELLING OR PAIN  UNUSUAL VAGINAL DISCHARGE OR ITCHING   Items with * indicate a potential emergency and should be followed up as soon as possible or go to the Emergency Department if any problems should occur.  Please show the CHEMOTHERAPY ALERT CARD or IMMUNOTHERAPY ALERT CARD at check-in to the Emergency Department and triage  nurse.  Should you have questions after your visit or need to cancel or reschedule your appointment, please contact [facility] [phone]  and follow the prompts.  Office hours are 8:00 a.m. to 4:30 p.m. Monday - Friday. Please note that voicemails left after 4:00 p.m. may not be returned until the following business day.  We are closed weekends and major holidays. You have access to a nurse at all times for urgent questions. Please call the main number to the clinic [phone] and follow the prompts.  For any non-urgent questions, you may also contact your provider using MyChart. We now offer e-Visits for anyone 18 and older to request care online for non-urgent symptoms. For details visit [email or web address].   Also download the MyChart app! Go to the app store, search "MyChart", open the app, select [facility], and log in with your MyChart username and password.  Due to Covid, a mask is required upon entering the hospital/clinic. If you do not have a mask, one will be given to you upon arrival. For doctor visits, patients may have 1 support person aged 18 or older with them. For treatment visits, patients cannot have anyone with them due to current Covid guidelines and our immunocompromised population.  

## 2021-04-27 NOTE — Progress Notes (Signed)
Patient tolerated iron infusion with no complaints voiced. Peripheral IV site clean and dry with good blood return noted before and after infusion. Band aid applied. Patient declined AVS. VSS with discharge and left in satisfactory condition with no s/s of distress noted.   

## 2021-05-11 ENCOUNTER — Encounter (HOSPITAL_COMMUNITY): Payer: Self-pay

## 2021-05-11 ENCOUNTER — Inpatient Hospital Stay (HOSPITAL_COMMUNITY): Payer: Medicare PPO

## 2021-05-11 ENCOUNTER — Other Ambulatory Visit: Payer: Self-pay

## 2021-05-11 VITALS — BP 135/85 | HR 51 | Temp 97.4°F | Resp 18 | Ht 64.0 in | Wt 197.1 lb

## 2021-05-11 DIAGNOSIS — E611 Iron deficiency: Secondary | ICD-10-CM | POA: Diagnosis not present

## 2021-05-11 DIAGNOSIS — D500 Iron deficiency anemia secondary to blood loss (chronic): Secondary | ICD-10-CM

## 2021-05-11 MED ORDER — ACETAMINOPHEN 325 MG PO TABS
650.0000 mg | ORAL_TABLET | Freq: Once | ORAL | Status: AC
  Administered 2021-05-11: 650 mg via ORAL
  Filled 2021-05-11: qty 2

## 2021-05-11 MED ORDER — SODIUM CHLORIDE 0.9 % IV SOLN
300.0000 mg | Freq: Once | INTRAVENOUS | Status: AC
  Administered 2021-05-11: 300 mg via INTRAVENOUS
  Filled 2021-05-11: qty 300

## 2021-05-11 MED ORDER — LORATADINE 10 MG PO TABS
10.0000 mg | ORAL_TABLET | Freq: Once | ORAL | Status: AC
  Administered 2021-05-11: 10 mg via ORAL
  Filled 2021-05-11: qty 1

## 2021-05-11 MED ORDER — SODIUM CHLORIDE 0.9 % IV SOLN: Freq: Once | INTRAVENOUS | Status: AC

## 2021-05-11 NOTE — Patient Instructions (Signed)
[facility]  Discharge Instructions: Thank you for choosing [facility] to provide your oncology and hematology care.  If you have a lab appointment with the Cancer Center, please come in thru the Main Entrance and check in at the main information desk.  Wear comfortable clothing and clothing appropriate for easy access to any Portacath or PICC line.   We strive to give you quality time with your provider. You may need to reschedule your appointment if you arrive late (15 or more minutes).  Arriving late affects you and other patients whose appointments are after yours.  Also, if you miss three or more appointments without notifying the office, you may be dismissed from the clinic at the provider's discretion.      For prescription refill requests, have your pharmacy contact our office and allow 72 hours for refills to be completed.    Today you received the following: Venofer, return as scheduled.   To help prevent nausea and vomiting after your treatment, we encourage you to take your nausea medication as directed.  BELOW ARE SYMPTOMS THAT SHOULD BE REPORTED IMMEDIATELY: *FEVER GREATER THAN 100.4 F (38 C) OR HIGHER *CHILLS OR SWEATING *NAUSEA AND VOMITING THAT IS NOT CONTROLLED WITH YOUR NAUSEA MEDICATION *UNUSUAL SHORTNESS OF BREATH *UNUSUAL BRUISING OR BLEEDING *URINARY PROBLEMS (pain or burning when urinating, or frequent urination) *BOWEL PROBLEMS (unusual diarrhea, constipation, pain near the anus) TENDERNESS IN MOUTH AND THROAT WITH OR WITHOUT PRESENCE OF ULCERS (sore throat, sores in mouth, or a toothache) UNUSUAL RASH, SWELLING OR PAIN  UNUSUAL VAGINAL DISCHARGE OR ITCHING   Items with * indicate a potential emergency and should be followed up as soon as possible or go to the Emergency Department if any problems should occur.  Please show the CHEMOTHERAPY ALERT CARD or IMMUNOTHERAPY ALERT CARD at check-in to the Emergency Department and triage  nurse.  Should you have questions after your visit or need to cancel or reschedule your appointment, please contact [facility] [phone]  and follow the prompts.  Office hours are 8:00 a.m. to 4:30 p.m. Monday - Friday. Please note that voicemails left after 4:00 p.m. may not be returned until the following business day.  We are closed weekends and major holidays. You have access to a nurse at all times for urgent questions. Please call the main number to the clinic [phone] and follow the prompts.  For any non-urgent questions, you may also contact your provider using MyChart. We now offer e-Visits for anyone 18 and older to request care online for non-urgent symptoms. For details visit [email or web address].   Also download the MyChart app! Go to the app store, search "MyChart", open the app, select [facility], and log in with your MyChart username and password.  Due to Covid, a mask is required upon entering the hospital/clinic. If you do not have a mask, one will be given to you upon arrival. For doctor visits, patients may have 1 support person aged 18 or older with them. For treatment visits, patients cannot have anyone with them due to current Covid guidelines and our immunocompromised population.  

## 2021-05-11 NOTE — Progress Notes (Signed)
Chaplain engaged in an initial visit with [facility].  [name] shared about her treatment schedule and her hopes of being done with iron infusions.  She will find out towards Feb. If she still needs to come for infusions.  Chaplain offered support and presence.    05/11/21 1100  Clinical Encounter Type  Visited With Patient  Visit Type Initial

## 2021-05-11 NOTE — Progress Notes (Signed)
Patient tolerated iron infusion with no complaints voiced.  Peripheral IV site clean and dry with good blood return noted before and after infusion.  Band aid applied.  VSS with discharge and left in satisfactory condition with no s/s of distress noted.   

## 2021-05-15 DIAGNOSIS — K219 Gastro-esophageal reflux disease without esophagitis: Secondary | ICD-10-CM | POA: Diagnosis not present

## 2021-05-15 DIAGNOSIS — I10 Essential (primary) hypertension: Secondary | ICD-10-CM | POA: Diagnosis not present

## 2021-06-12 ENCOUNTER — Other Ambulatory Visit: Payer: Self-pay

## 2021-06-12 ENCOUNTER — Other Ambulatory Visit (HOSPITAL_COMMUNITY)
Admission: RE | Admit: 2021-06-12 | Discharge: 2021-06-12 | Disposition: A | Discharge status: Home / Self Care | Payer: Medicare PPO | Source: Ambulatory Visit | Attending: Internal Medicine | Admitting: Internal Medicine

## 2021-06-12 DIAGNOSIS — Z1331 Encounter for screening for depression: Secondary | ICD-10-CM | POA: Diagnosis not present

## 2021-06-12 DIAGNOSIS — I10 Essential (primary) hypertension: Secondary | ICD-10-CM | POA: Diagnosis not present

## 2021-06-12 DIAGNOSIS — I2510 Atherosclerotic heart disease of native coronary artery without angina pectoris: Secondary | ICD-10-CM | POA: Diagnosis not present

## 2021-06-12 DIAGNOSIS — Z0001 Encounter for general adult medical examination with abnormal findings: Secondary | ICD-10-CM | POA: Insufficient documentation

## 2021-06-12 DIAGNOSIS — Z1389 Encounter for screening for other disorder: Secondary | ICD-10-CM | POA: Diagnosis not present

## 2021-06-12 DIAGNOSIS — Z0000 Encounter for general adult medical examination without abnormal findings: Secondary | ICD-10-CM | POA: Diagnosis not present

## 2021-06-12 DIAGNOSIS — K219 Gastro-esophageal reflux disease without esophagitis: Secondary | ICD-10-CM | POA: Diagnosis not present

## 2021-06-12 LAB — LIPID PANEL
Cholesterol: 183 mg/dL (ref 0–200)
HDL: 43 mg/dL (ref >40)
LDL Cholesterol: 108 mg/dL — ABNORMAL HIGH (ref 0–99)
Total CHOL/HDL Ratio: 4.3 RATIO
Triglycerides: 159 mg/dL — ABNORMAL HIGH (ref <150)
VLDL: 32 mg/dL (ref 0–40)

## 2021-06-12 LAB — CBC WITH DIFFERENTIAL/PLATELET
Abs Immature Granulocytes: 0.04 10*3/uL (ref 0.00–0.07)
Basophils Absolute: 0.1 10*3/uL (ref 0.0–0.1)
Basophils Relative: 1 %
Eosinophils Absolute: 0.3 10*3/uL (ref 0.0–0.5)
Eosinophils Relative: 4 %
HCT: 49.7 % — ABNORMAL HIGH (ref 36.0–46.0)
Hemoglobin: 16.1 g/dL — ABNORMAL HIGH (ref 12.0–15.0)
Immature Granulocytes: 1 %
Lymphocytes Relative: 20 %
Lymphs Abs: 1.4 10*3/uL (ref 0.7–4.0)
MCH: 26.6 pg (ref 26.0–34.0)
MCHC: 32.4 g/dL (ref 30.0–36.0)
MCV: 82.1 fL (ref 80.0–100.0)
Monocytes Absolute: 0.5 10*3/uL (ref 0.1–1.0)
Monocytes Relative: 7 %
Neutro Abs: 4.8 10*3/uL (ref 1.7–7.7)
Neutrophils Relative %: 67 %
Platelets: 281 10*3/uL (ref 150–400)
RBC: 6.05 MIL/uL — ABNORMAL HIGH (ref 3.87–5.11)
RDW: 19.8 % — ABNORMAL HIGH (ref 11.5–15.5)
WBC: 7.1 10*3/uL (ref 4.0–10.5)
nRBC: 0 % (ref 0.0–0.2)

## 2021-06-12 LAB — BASIC METABOLIC PANEL
Anion gap: 10 (ref 5–15)
BUN: 12 mg/dL (ref 8–23)
CO2: 24 mmol/L (ref 22–32)
Calcium: 9.4 mg/dL (ref 8.9–10.3)
Chloride: 106 mmol/L (ref 98–111)
Creatinine, Ser: 0.85 mg/dL (ref 0.44–1.00)
GFR, Estimated: >60 mL/min (ref >60)
Glucose, Bld: 95 mg/dL (ref 70–99)
Potassium: 3.8 mmol/L (ref 3.5–5.1)
Sodium: 140 mmol/L (ref 135–145)

## 2021-06-12 LAB — HEPATIC FUNCTION PANEL
ALT: 24 U/L (ref 0–44)
AST: 23 U/L (ref 15–41)
Albumin: 4.2 g/dL (ref 3.5–5.0)
Alkaline Phosphatase: 85 U/L (ref 38–126)
Bilirubin, Direct: 0.1 mg/dL (ref 0.0–0.2)
Indirect Bilirubin: 0.8 mg/dL (ref 0.3–0.9)
Total Bilirubin: 0.9 mg/dL (ref 0.3–1.2)
Total Protein: 7.2 g/dL (ref 6.5–8.1)

## 2021-06-21 DIAGNOSIS — E7800 Pure hypercholesterolemia, unspecified: Secondary | ICD-10-CM | POA: Diagnosis not present

## 2021-06-21 DIAGNOSIS — I10 Essential (primary) hypertension: Secondary | ICD-10-CM | POA: Diagnosis not present

## 2021-06-21 DIAGNOSIS — K922 Gastrointestinal hemorrhage, unspecified: Secondary | ICD-10-CM | POA: Diagnosis not present

## 2021-06-21 DIAGNOSIS — Z91041 Radiographic dye allergy status: Secondary | ICD-10-CM | POA: Diagnosis not present

## 2021-06-21 DIAGNOSIS — Z951 Presence of aortocoronary bypass graft: Secondary | ICD-10-CM | POA: Diagnosis not present

## 2021-06-21 DIAGNOSIS — Z880 Allergy status to penicillin: Secondary | ICD-10-CM | POA: Diagnosis not present

## 2021-06-21 DIAGNOSIS — R42 Dizziness and giddiness: Secondary | ICD-10-CM | POA: Diagnosis not present

## 2021-07-13 DIAGNOSIS — I2510 Atherosclerotic heart disease of native coronary artery without angina pectoris: Secondary | ICD-10-CM | POA: Diagnosis not present

## 2021-07-13 DIAGNOSIS — I10 Essential (primary) hypertension: Secondary | ICD-10-CM | POA: Diagnosis not present

## 2021-08-02 ENCOUNTER — Inpatient Hospital Stay (HOSPITAL_COMMUNITY): Payer: Medicare PPO | Attending: Hematology

## 2021-08-02 DIAGNOSIS — D500 Iron deficiency anemia secondary to blood loss (chronic): Secondary | ICD-10-CM | POA: Insufficient documentation

## 2021-08-02 DIAGNOSIS — Z7982 Long term (current) use of aspirin: Secondary | ICD-10-CM | POA: Insufficient documentation

## 2021-08-02 DIAGNOSIS — D751 Secondary polycythemia: Secondary | ICD-10-CM | POA: Diagnosis not present

## 2021-08-02 LAB — CBC WITH DIFFERENTIAL/PLATELET
Abs Immature Granulocytes: 0.03 10*3/uL (ref 0.00–0.07)
Basophils Absolute: 0.1 10*3/uL (ref 0.0–0.1)
Basophils Relative: 1 %
Eosinophils Absolute: 0.2 10*3/uL (ref 0.0–0.5)
Eosinophils Relative: 3 %
HCT: 49.9 % — ABNORMAL HIGH (ref 36.0–46.0)
Hemoglobin: 16.2 g/dL — ABNORMAL HIGH (ref 12.0–15.0)
Immature Granulocytes: 0 %
Lymphocytes Relative: 20 %
Lymphs Abs: 1.6 10*3/uL (ref 0.7–4.0)
MCH: 28.1 pg (ref 26.0–34.0)
MCHC: 32.5 g/dL (ref 30.0–36.0)
MCV: 86.5 fL (ref 80.0–100.0)
Monocytes Absolute: 0.5 10*3/uL (ref 0.1–1.0)
Monocytes Relative: 7 %
Neutro Abs: 5.3 10*3/uL (ref 1.7–7.7)
Neutrophils Relative %: 69 %
Platelets: 335 10*3/uL (ref 150–400)
RBC: 5.77 MIL/uL — ABNORMAL HIGH (ref 3.87–5.11)
RDW: 17.5 % — ABNORMAL HIGH (ref 11.5–15.5)
WBC: 7.7 10*3/uL (ref 4.0–10.5)
nRBC: 0 % (ref 0.0–0.2)

## 2021-08-02 LAB — IRON AND TIBC
Iron: 69 ug/dL (ref 28–170)
Saturation Ratios: 21 % (ref 10.4–31.8)
TIBC: 328 ug/dL (ref 250–450)
UIBC: 259 ug/dL

## 2021-08-02 LAB — FERRITIN: Ferritin: 69 ng/mL (ref 11–307)

## 2021-08-03 LAB — CARBON MONOXIDE, BLOOD (PERFORMED AT REF LAB): Carbon Monoxide, Blood: 3.1 % (ref 0.0–3.6)

## 2021-08-09 ENCOUNTER — Inpatient Hospital Stay (HOSPITAL_BASED_OUTPATIENT_CLINIC_OR_DEPARTMENT_OTHER): Payer: Medicare PPO | Admitting: Physician Assistant

## 2021-08-09 ENCOUNTER — Other Ambulatory Visit: Payer: Self-pay

## 2021-08-09 DIAGNOSIS — D500 Iron deficiency anemia secondary to blood loss (chronic): Secondary | ICD-10-CM | POA: Diagnosis not present

## 2021-08-09 NOTE — Progress Notes (Signed)
? ?Virtual Visit via Telephone Note ?[facility] ? ?I connected with [name]  on 08/09/21 at 2:53 PM by telephone and verified that I am speaking with the correct person using two identifiers. ? ?Location: ?Patient: Home ?Provider: [facility] ?  ?I discussed the limitations, risks, security and privacy concerns of performing an evaluation and management service by telephone and the availability of in person appointments. I also discussed with the patient that there may be a patient responsible charge related to this service. The patient expressed understanding and agreed to proceed. ? ? ?HISTORY OF PRESENT ILLNESS: ?[name] is contacted today for follow-up regarding her iron deficiency and erythrocytosis.  She was last evaluated via telemedicine visit by Rojelio Brenner PA-C on 04/05/2021. ? ?She has no new complaints to offer today, continues to report good energy and denies any signs or symptoms of blood loss, apart from some scant rectal bleeding after straining from constipation.  No fatigue, chest pain, dyspnea on exertion.  No erythromelalgia, aquagenic pruritus, or vasomotor symptoms. ?No changes since her last visit. ?She reports 100% energy and 100% appetite. ? ?She reports 100% energy and 100% appetite.  She is maintaining stable weight. ? ?  ?OBSERVATIONS/OBJECTIVE: ?Review of Systems  ?Constitutional:  Negative for chills, diaphoresis, fever, malaise/fatigue and weight loss.  ?Respiratory:  Negative for cough and shortness of breath.   ?Cardiovascular:  Negative for chest pain and palpitations.  ?Gastrointestinal:  Negative for abdominal pain, blood in stool, melena, nausea and vomiting.  ?Neurological:  Negative for dizziness and headaches.   ? ?PHYSICAL EXAM (per limitations of virtual telephone visit): The patient is alert and oriented x 3, exhibiting adequate mentation, good mood, and ability to speak in full sentences and execute sound judgement. ? ? ?ASSESSMENT  & PLAN: ?1.  Iron deficiency anemia secondary to chronic blood loss ?- Initial hematology consultation on 07/28/2020 ?- EGD (03/14/2020): Large hiatal hernia, gastritis ?- Colonoscopy (03/14/2020): Nonbleeding internal hemorrhoids, diverticulosis ?- Capsule endoscopy (06/26/2020): Significant for AVM and possible blood loss from AVM ?- Unable to tolerate oral iron tablets ?- Receives intermittent IV iron, most recently given on 05/11/2021 ?- She denies any hematemesis, hematochezia, or melena. ?- Most recent labs (08/02/2021): Hgb 16.2/HCT 49.9.  Ferritin 69, iron saturation 21%, TIBC 328. ?- PLAN: No indication for IV iron at this time. ?- Repeat CBC and iron panel in 6 months.  RTC after labs. ?  ?2.  Erythrocytosis, suspected secondary polycythemia ?- Patient has had erythrocytosis since May 2022.  She did previously have some isolated elevated Hgb in 2011 and 2012. ?- No known history of OSA.  Non-smoker.  No history of diuretic use. ?- No erythromelalgia, aquagenic pruritus, or vasomotor symptoms. ?- She takes aspirin 81 mg daily for heart health. ?- Work-up of erythrocytosis was negative for JAK2, CALR, or MPL mutations. ?- Erythropoietin normal. ?- Carbon monoxide was mildly elevated at 4.3 on 03/13/2021, but has normalized with carbon monoxide 3.1 on 08/02/2021.  Carboxyhemoglobin has been normal. She lives in a single wide trailer with electric utilities, denies any wood-burning stove's or gas appliances. ?- Differential diagnosis also includes possibility for underlying sleep apnea, which she is at risk for due to her obesity.  Recommended sleep study, but patient refused, stating that "there have just been too many tests lately" ?- PLAN: Continue to monitor CBC at follow-up visit in 6 months. ?- No indication for phlebotomy or other treatment at this time. ?- Recommended sleep study, which  patient refused. ? ?  ?I discussed the assessment and treatment plan with the patient. The patient was provided an  opportunity to ask questions and all were answered. The patient agreed with the plan and demonstrated an understanding of the instructions. ?  ?The patient was advised to call back or seek an in-person evaluation if the symptoms worsen or if the condition fails to improve as anticipated. ? ?I provided 12 minutes of non-face-to-face time during this encounter. ? ? ?Carnella Gu[place], PA-C ?08/09/21 4:32 PM  ?

## 2021-08-10 DIAGNOSIS — I10 Essential (primary) hypertension: Secondary | ICD-10-CM | POA: Diagnosis not present

## 2021-08-10 DIAGNOSIS — I2510 Atherosclerotic heart disease of native coronary artery without angina pectoris: Secondary | ICD-10-CM | POA: Diagnosis not present

## 2021-09-10 DIAGNOSIS — K219 Gastro-esophageal reflux disease without esophagitis: Secondary | ICD-10-CM | POA: Diagnosis not present

## 2021-09-10 DIAGNOSIS — I10 Essential (primary) hypertension: Secondary | ICD-10-CM | POA: Diagnosis not present

## 2021-10-10 DIAGNOSIS — I10 Essential (primary) hypertension: Secondary | ICD-10-CM | POA: Diagnosis not present

## 2021-10-10 DIAGNOSIS — I2510 Atherosclerotic heart disease of native coronary artery without angina pectoris: Secondary | ICD-10-CM | POA: Diagnosis not present

## 2021-11-10 DIAGNOSIS — I10 Essential (primary) hypertension: Secondary | ICD-10-CM | POA: Diagnosis not present

## 2021-11-10 DIAGNOSIS — I2510 Atherosclerotic heart disease of native coronary artery without angina pectoris: Secondary | ICD-10-CM | POA: Diagnosis not present

## 2021-12-03 DIAGNOSIS — I10 Essential (primary) hypertension: Secondary | ICD-10-CM | POA: Diagnosis not present

## 2021-12-03 DIAGNOSIS — K219 Gastro-esophageal reflux disease without esophagitis: Secondary | ICD-10-CM | POA: Diagnosis not present

## 2021-12-03 DIAGNOSIS — I2510 Atherosclerotic heart disease of native coronary artery without angina pectoris: Secondary | ICD-10-CM | POA: Diagnosis not present

## 2021-12-03 DIAGNOSIS — Z6834 Body mass index (BMI) 34.0-34.9, adult: Secondary | ICD-10-CM | POA: Diagnosis not present

## 2022-01-03 DIAGNOSIS — E782 Mixed hyperlipidemia: Secondary | ICD-10-CM | POA: Diagnosis not present

## 2022-01-03 DIAGNOSIS — I10 Essential (primary) hypertension: Secondary | ICD-10-CM | POA: Diagnosis not present

## 2022-02-03 DIAGNOSIS — E785 Hyperlipidemia, unspecified: Secondary | ICD-10-CM | POA: Diagnosis not present

## 2022-02-03 DIAGNOSIS — I10 Essential (primary) hypertension: Secondary | ICD-10-CM | POA: Diagnosis not present

## 2022-02-07 ENCOUNTER — Ambulatory Visit: Payer: Medicare PPO | Admitting: Physician Assistant

## 2022-02-07 ENCOUNTER — Inpatient Hospital Stay: Payer: Medicare PPO | Attending: Physician Assistant

## 2022-02-07 NOTE — Progress Notes (Deleted)
NO SHOW

## 2022-02-21 ENCOUNTER — Emergency Department (HOSPITAL_COMMUNITY): Payer: Medicare PPO

## 2022-02-21 ENCOUNTER — Other Ambulatory Visit: Payer: Self-pay

## 2022-02-21 ENCOUNTER — Emergency Department (HOSPITAL_COMMUNITY)
Admission: EM | Admit: 2022-02-21 | Discharge: 2022-02-21 | Disposition: A | Discharge status: Home / Self Care | Payer: Medicare PPO | Attending: Student | Admitting: Student

## 2022-02-21 DIAGNOSIS — Z79899 Other long term (current) drug therapy: Secondary | ICD-10-CM | POA: Insufficient documentation

## 2022-02-21 DIAGNOSIS — M546 Pain in thoracic spine: Secondary | ICD-10-CM | POA: Diagnosis not present

## 2022-02-21 DIAGNOSIS — I2510 Atherosclerotic heart disease of native coronary artery without angina pectoris: Secondary | ICD-10-CM | POA: Diagnosis not present

## 2022-02-21 DIAGNOSIS — Z7982 Long term (current) use of aspirin: Secondary | ICD-10-CM | POA: Diagnosis not present

## 2022-02-21 DIAGNOSIS — M545 Low back pain, unspecified: Secondary | ICD-10-CM

## 2022-02-21 DIAGNOSIS — J449 Chronic obstructive pulmonary disease, unspecified: Secondary | ICD-10-CM | POA: Insufficient documentation

## 2022-02-21 DIAGNOSIS — R079 Chest pain, unspecified: Secondary | ICD-10-CM | POA: Diagnosis not present

## 2022-02-21 DIAGNOSIS — Z20822 Contact with and (suspected) exposure to covid-19: Secondary | ICD-10-CM | POA: Insufficient documentation

## 2022-02-21 DIAGNOSIS — R61 Generalized hyperhidrosis: Secondary | ICD-10-CM | POA: Insufficient documentation

## 2022-02-21 DIAGNOSIS — I10 Essential (primary) hypertension: Secondary | ICD-10-CM | POA: Insufficient documentation

## 2022-02-21 DIAGNOSIS — R0600 Dyspnea, unspecified: Secondary | ICD-10-CM | POA: Diagnosis not present

## 2022-02-21 DIAGNOSIS — Z87891 Personal history of nicotine dependence: Secondary | ICD-10-CM | POA: Insufficient documentation

## 2022-02-21 LAB — CBC WITH DIFFERENTIAL/PLATELET
Abs Immature Granulocytes: 0.12 10*3/uL — ABNORMAL HIGH (ref 0.00–0.07)
Basophils Absolute: 0.1 10*3/uL (ref 0.0–0.1)
Basophils Relative: 1 %
Eosinophils Absolute: 0.1 10*3/uL (ref 0.0–0.5)
Eosinophils Relative: 1 %
HCT: 48.9 % — ABNORMAL HIGH (ref 36.0–46.0)
Hemoglobin: 16.2 g/dL — ABNORMAL HIGH (ref 12.0–15.0)
Immature Granulocytes: 1 %
Lymphocytes Relative: 14 %
Lymphs Abs: 1.4 10*3/uL (ref 0.7–4.0)
MCH: 28.5 pg (ref 26.0–34.0)
MCHC: 33.1 g/dL (ref 30.0–36.0)
MCV: 86.1 fL (ref 80.0–100.0)
Monocytes Absolute: 0.5 10*3/uL (ref 0.1–1.0)
Monocytes Relative: 5 %
Neutro Abs: 8 10*3/uL — ABNORMAL HIGH (ref 1.7–7.7)
Neutrophils Relative %: 78 %
Platelets: 269 10*3/uL (ref 150–400)
RBC: 5.68 MIL/uL — ABNORMAL HIGH (ref 3.87–5.11)
RDW: 13.8 % (ref 11.5–15.5)
WBC: 10.2 10*3/uL (ref 4.0–10.5)
nRBC: 0 % (ref 0.0–0.2)

## 2022-02-21 LAB — COMPREHENSIVE METABOLIC PANEL
ALT: 12 U/L (ref 0–44)
AST: 16 U/L (ref 15–41)
Albumin: 4 g/dL (ref 3.5–5.0)
Alkaline Phosphatase: 77 U/L (ref 38–126)
Anion gap: 8 (ref 5–15)
BUN: 11 mg/dL (ref 8–23)
CO2: 26 mmol/L (ref 22–32)
Calcium: 8.9 mg/dL (ref 8.9–10.3)
Chloride: 105 mmol/L (ref 98–111)
Creatinine, Ser: 0.87 mg/dL (ref 0.44–1.00)
GFR, Estimated: >60 mL/min (ref >60)
Glucose, Bld: 103 mg/dL — ABNORMAL HIGH (ref 70–99)
Potassium: 3.6 mmol/L (ref 3.5–5.1)
Sodium: 139 mmol/L (ref 135–145)
Total Bilirubin: 0.8 mg/dL (ref 0.3–1.2)
Total Protein: 7 g/dL (ref 6.5–8.1)

## 2022-02-21 LAB — RESP PANEL BY RT-PCR (FLU A&B, COVID) ARPGX2
Influenza A by PCR: NEGATIVE
Influenza B by PCR: NEGATIVE
SARS Coronavirus 2 by RT PCR: NEGATIVE

## 2022-02-21 LAB — C-REACTIVE PROTEIN: CRP: 0.6 mg/dL (ref <1.0)

## 2022-02-21 LAB — SEDIMENTATION RATE: Sed Rate: 8 mm/hr (ref 0–22)

## 2022-02-21 LAB — TROPONIN I (HIGH SENSITIVITY): Troponin I (High Sensitivity): 4 ng/L (ref <18)

## 2022-02-21 MED ORDER — KETOROLAC TROMETHAMINE 15 MG/ML IJ SOLN
15.0000 mg | Freq: Once | INTRAMUSCULAR | Status: AC
Start: 2022-02-21 — End: 2022-02-21
  Administered 2022-02-21: 15 mg via INTRAMUSCULAR
  Filled 2022-02-21: qty 1

## 2022-02-21 MED ORDER — LIDOCAINE 5 % EX PTCH
1.0000 | MEDICATED_PATCH | CUTANEOUS | Status: DC
  Administered 2022-02-21: 1 via TRANSDERMAL
  Filled 2022-02-21: qty 1

## 2022-02-21 MED ORDER — LIDOCAINE 5 % EX PTCH: 1.0000 | MEDICATED_PATCH | CUTANEOUS | 0 refills | Status: DC

## 2022-02-21 MED ORDER — OXYCODONE HCL 5 MG PO TABS: 5.0000 mg | ORAL_TABLET | ORAL | 0 refills | Status: DC | PRN

## 2022-02-21 NOTE — ED Triage Notes (Signed)
Patient presents to ED via POV  c/o lumbar pain, reports when she moves it feel like her back is locking. Pain worse on movement. Denies injury to her  back. Denies shortness or  breath or chest pain.

## 2022-02-21 NOTE — ED Provider Notes (Signed)
[facility] Provider Note  CSN: [id number] Arrival date & time: 02/21/22 1009  Chief Complaint(s) Back Pain  HPI [name] is a 76 y.o. female with PMH anxiety, CAD, chronic back pain, COPD, GI bleed and ischemic colitis who presents emergency department for evaluation of back pain.  Patient states that she has been dealing with a viral upper respiratory infection for the last few days and try to get out of bed today when she felt a spasming in her back.  She states the pain is worse in the lumbar region and shoots up into the thoracic region and down into the mid thigh.  She denies chest pain, shortness of breath, abdominal pain, nausea, vomiting or other systemic symptoms.  Numbness, tingling, weakness, saddle anesthesia, urinary retention or involuntary urination.  In addition, patient states that she is intermittently having episodes of diaphoresis where she needs to change her shirt.  She states that this primarily happens when the pain is bad in her back.   Past Medical History Past Medical History:  Diagnosis Date   Anxiety    CAD (coronary artery disease)    Multivessel status post CABG 1998   Cholelithiasis    Chronic back pain    Colitis, ischemic ([place]) 03/2011   COPD (chronic obstructive pulmonary disease) ([place])    Diverticulosis    Essential hypertension    GERD (gastroesophageal reflux disease)    GI bleed 03/2011   Hiatal hernia    Multiple pulmonary nodules 03/2011   Nephrolithiasis    Patient Active Problem List   Diagnosis Date Noted   Iron deficiency anemia due to chronic blood loss 07/28/2020   Angioedema 05/24/2020   Anemia 02/17/2020   History of GI bleed 02/17/2020   Sepsis ([place]) 06/07/2016   Acute bronchitis 06/07/2016   Essential hypertension 03/17/2014   Hypokalemia 03/17/2014   Volume overload 03/17/2014   Dehydration 03/16/2014   COPD (chronic obstructive pulmonary disease) ([place]) 03/15/2014   Chest pain 03/15/2014   Lower  GI bleed 03/14/2014   GIB (gastrointestinal bleeding) 03/14/2014   COPD with acute exacerbation ([place]) 06/17/2013   Colitis 06/03/2011    Class: Hospitalized for   Home Medication(s) Prior to Admission medications   Medication Sig Start Date End Date Taking? Authorizing Provider  acetaminophen (TYLENOL) 650 MG CR tablet Take 650 mg by mouth every 8 (eight) hours as needed for pain.   Yes [provider]  amLODipine (NORVASC) 5 MG tablet  05/26/20  Yes [provider]  aspirin EC 81 MG tablet Take 81 mg by mouth daily. Swallow whole.   Yes [provider]  atorvastatin (LIPITOR) 40 MG tablet Take 40 mg by mouth at bedtime.  04/24/13  Yes [provider]  cholecalciferol (VITAMIN D) 1000 UNITS tablet Take 1,000 Units by mouth daily.   Yes [provider]  ibuprofen (ADVIL) 800 MG tablet Take 800 mg by mouth every 6 (six) hours as needed for mild pain.   Yes [provider]  lidocaine (LIDODERM) 5 % Place 1 patch onto the skin daily. Remove & Discard patch within 12 hours or as directed by MD 02/21/22  Yes Kraven Calk, MD  loratadine (CLARITIN) 10 MG tablet Take 10 mg by mouth daily.   Yes [provider]  metoprolol succinate (TOPROL-XL) 25 MG 24 hr tablet Take 25 mg by mouth 2 (two) times daily.   Yes [provider]  oxyCODONE (ROXICODONE) 5 MG immediate release tablet Take 1 tablet (5  mg total) by mouth every 4 (four) hours as needed for severe pain. 02/21/22  Yes Chella Chapdelaine, MD  pantoprazole (PROTONIX) 40 MG tablet Take 1 tablet (40 mg total) by mouth 2 (two) times daily. Patient taking differently: Take 40 mg by mouth daily. 06/24/13  Yes Fanta, Normajean Baxter, MD  lisinopril (PRINIVIL,ZESTRIL) 10 MG tablet Take 10 mg by mouth daily.    03/18/20  [provider]                                                                                                                                    Past Surgical  History Past Surgical History:  Procedure Laterality Date   ABDOMINAL HYSTERECTOMY     LSO   BACK SURGERY     BIOPSY  03/14/2020   Procedure: BIOPSY;  Surgeon: Eloise Harman, DO;  [facility];  Service: Endoscopy;;   COLONOSCOPY  04/16/2011   Dr. Oneida Alar: moderate diverticulosis, small internal hemorrhoids, likely ischemic colitis   COLONOSCOPY WITH PROPOFOL N/A 03/14/2020   Procedure: COLONOSCOPY WITH PROPOFOL;  Surgeon: Eloise Harman, DO;  Location: [facility];  Service: Endoscopy;  Laterality: N/A;  1:00pm   CORONARY ARTERY BYPASS GRAFT  [place]   ESOPHAGOGASTRODUODENOSCOPY N/A 06/23/2013   Dr. Gala Romney: cervical esophageal web likely s/p dilation with scope, non-critical Schatzki's ring, large hiatal hernia with Lysbeth Galas lesions, chronic active gastritis   ESOPHAGOGASTRODUODENOSCOPY (EGD) WITH PROPOFOL N/A 03/14/2020   Procedure: ESOPHAGOGASTRODUODENOSCOPY (EGD) WITH PROPOFOL;  Surgeon: Eloise Harman, DO;  [facility];  Service: Endoscopy;  Laterality: N/A;   GIVENS CAPSULE STUDY N/A 06/26/2020   Procedure: GIVENS CAPSULE STUDY;  Surgeon: Eloise Harman, DO;  Location: [facility];  Service: Endoscopy;  Laterality: N/A;  7:30am   SALPINGOOPHORECTOMY Left    SMALL INTESTINE SURGERY     SPINAL FUSION     x3   Family History Family History  Problem Relation Age of Onset   Stomach cancer Mother 55   Breast cancer Sister 34   Diabetes Mellitus II Sister    Colon cancer Neg Hx    Esophageal cancer Neg Hx     Social History Social History   Tobacco Use   Smoking status: Former    Packs/day: 1.00    Years: 40.00    Total pack years: 40.00    Types: Cigarettes    Quit date: 09/14/2010    Years since quitting: 11.4   Smokeless tobacco: Never  Substance Use Topics   Alcohol use: No   Drug use: No    Comment: marijuana (QUIT couple yrs ago)   Allergies Lisinopril, Omnipaque [iohexol], and  Penicillins  Review of Systems Review of Systems  Musculoskeletal:  Positive for back pain.    Physical Exam Vital Signs  I have reviewed the triage vital signs BP (!) 155/73 (BP Location: Right Arm)  Pulse 72   Temp 98.6 F (37 C) (Oral)   Resp 16   Ht 5' 4"  (1.626 m)   Wt 85.7 kg   SpO2 100%   BMI 32.44 kg/m   Physical Exam Vitals and nursing note reviewed.  Constitutional:      General: She is not in acute distress.    Appearance: She is well-developed.  HENT:     Head: Normocephalic and atraumatic.  Eyes:     Conjunctiva/sclera: Conjunctivae normal.  Cardiovascular:     Rate and Rhythm: Normal rate and regular rhythm.     Heart sounds: No murmur heard. Pulmonary:     Effort: Pulmonary effort is normal. No respiratory distress.     Breath sounds: Normal breath sounds.  Abdominal:     Palpations: Abdomen is soft.     Tenderness: There is no abdominal tenderness.  Musculoskeletal:        General: Tenderness present. No swelling.     Cervical back: Neck supple.  Skin:    General: Skin is warm and dry.     Capillary Refill: Capillary refill takes less than 2 seconds.  Neurological:     General: No focal deficit present.     Mental Status: She is alert.     Cranial Nerves: No cranial nerve deficit.     Sensory: No sensory deficit.     Motor: No weakness.  Psychiatric:        Mood and Affect: Mood normal.     ED Results and Treatments Labs (all labs ordered are listed, but only abnormal results are displayed) Labs Reviewed  COMPREHENSIVE METABOLIC PANEL - Abnormal; Notable for the following components:      Result Value   Glucose, Bld 103 (*)    All other components within normal limits  CBC WITH DIFFERENTIAL/PLATELET - Abnormal; Notable for the following components:   RBC 5.68 (*)    Hemoglobin 16.2 (*)    HCT 48.9 (*)    Neutro Abs 8.0 (*)    Abs Immature Granulocytes 0.12 (*)    All other components within normal limits  RESP PANEL BY RT-PCR  (FLU A&B, COVID) ARPGX2  SEDIMENTATION RATE  C-REACTIVE PROTEIN  TROPONIN I (HIGH SENSITIVITY)                                                                                                                          Radiology CT Lumbar Spine Wo Contrast  Result Date: 02/21/2022 CLINICAL DATA:  Low back pain. EXAM: CT LUMBAR SPINE WITHOUT CONTRAST TECHNIQUE: Multidetector CT imaging of the lumbar spine was performed without intravenous contrast administration. Multiplanar CT image reconstructions were also generated. RADIATION DOSE REDUCTION: This exam was performed according to the departmental dose-optimization program which includes automated exposure control, adjustment of the mA and/or kV according to patient size and/or use of iterative reconstruction technique. COMPARISON:  Remote lumbar spine MRI from 2007 FINDINGS: Segmentation: There are five lumbar type vertebral bodies. The last full  intervertebral disc space is labeled L5-S1. Alignment: Normal Vertebrae: No fractures or bone lesions. Paraspinal and other soft tissues: No significant paraspinal or retroperitoneal findings. Advanced atherosclerotic calcifications involving the aorta and branch vessels but no aneurysm. Disc levels: T12-L1: No significant findings. L1-2: Mild facet disease but no disc protrusions, spinal or foraminal stenosis. L2-3: Degenerative disc disease with a diffuse bulging annulus and osteophytic ridging. There is also moderate to advanced facet disease, left greater than right. These findings contribute to mild spinal stenosis and moderate bilateral lateral recess stenosis. There is also mild bilateral foraminal stenosis. L3-4: Diffuse annular bulge and moderate facet disease but no significant spinal or foraminal stenosis. L4-5: Diffuse annular bulge and moderate facet disease but no significant spinal or foraminal stenosis. L5-S1: Bulging annulus and osteophytic ridging along with moderate facet disease but no significant  spinal or foraminal stenosis. IMPRESSION: 1. Normal alignment and no acute bony findings. 2. Mild spinal stenosis and moderate bilateral lateral recess stenosis and mild bilateral foraminal stenosis at L2-3. 3. Advanced atherosclerotic calcifications involving the aorta and branch vessels but no aneurysm. Electronically Signed   By: Marijo Sanes M.D.   On: 02/21/2022 12:36   DG Chest Portable 1 View  Result Date: 02/21/2022 CLINICAL DATA:  Dyspnea.  Pain in back EXAM: PORTABLE CHEST 1 VIEW COMPARISON:  06/21/2016 FINDINGS: Previous median sternotomy and CABG procedure. Aortic atherosclerotic calcifications. Stable cardiomediastinal contours. No pleural effusion or edema. No airspace opacities identified. The visualized osseous structures are unremarkable. IMPRESSION: No acute cardiopulmonary abnormalities. Electronically Signed   By: Kerby Moors M.D.   On: 02/21/2022 11:34    Pertinent labs & imaging results that were available during my care of the patient were reviewed by me and considered in my medical decision making (see MDM for details).  Medications Ordered in ED Medications  lidocaine (LIDODERM) 5 % 1 patch (1 patch Transdermal Patch Applied 02/21/22 1248)  ketorolac (TORADOL) 15 MG/ML injection 15 mg (15 mg Intramuscular Given 02/21/22 1130)                                                                                                                                     Procedures Procedures  (including critical care time)  Medical Decision Making / ED Course   This patient presents to the ED for concern of back pain, this involves an extensive number of treatment options, and is a complaint that carries with it a high risk of complications and morbidity.  The differential diagnosis includes sciatica, muscle spasm, muscle strain, fracture, myalgias from viral illness  MDM: Patient seen in the emergency department for evaluation of back pain.  Physical exam with tenderness in the  L-spine but is otherwise unremarkable.  Neurologic exam unremarkable.  Laboratory evaluation unremarkable including negative ESR, negative high-sensitivity troponin, negative COVID and flu.  X-ray of the chest is unremarkable.  CT of the L-spine with mild spinal stenosis and moderate  bilateral lateral recess stenosis and foraminal stenosis at L2-L3, no metastatic bone lesions.  Patient given lidocaine patch and Toradol and on reevaluation symptoms have improved.  Patient structured to follow-up outpatient with her primary physician for suspected sciatica.  Short course of oxycodone provided for breakthrough pain only.  Patient then discharged.   Additional history obtained: -Additional history obtained from daughter -External records from outside source obtained and reviewed including: Chart review including previous notes, labs, imaging, consultation notes   Lab Tests: -I ordered, reviewed, and interpreted labs.   The pertinent results include:   Labs Reviewed  COMPREHENSIVE METABOLIC PANEL - Abnormal; Notable for the following components:      Result Value   Glucose, Bld 103 (*)    All other components within normal limits  CBC WITH DIFFERENTIAL/PLATELET - Abnormal; Notable for the following components:   RBC 5.68 (*)    Hemoglobin 16.2 (*)    HCT 48.9 (*)    Neutro Abs 8.0 (*)    Abs Immature Granulocytes 0.12 (*)    All other components within normal limits  RESP PANEL BY RT-PCR (FLU A&B, COVID) ARPGX2  SEDIMENTATION RATE  C-REACTIVE PROTEIN  TROPONIN I (HIGH SENSITIVITY)      Imaging Studies ordered: I ordered imaging studies including CT L-spine, chest x-ray I independently visualized and interpreted imaging. I agree with the radiologist interpretation   Medicines ordered and prescription drug management: Meds ordered this encounter  Medications   ketorolac (TORADOL) 15 MG/ML injection 15 mg   lidocaine (LIDODERM) 5 % 1 patch   lidocaine (LIDODERM) 5 %    Sig: Place 1  patch onto the skin daily. Remove & Discard patch within 12 hours or as directed by MD    Dispense:  30 patch    Refill:  0   oxyCODONE (ROXICODONE) 5 MG immediate release tablet    Sig: Take 1 tablet (5 mg total) by mouth every 4 (four) hours as needed for severe pain.    Dispense:  6 tablet    Refill:  0    -I have reviewed the patients home medicines and have made adjustments as needed  Critical interventions none     Cardiac Monitoring: The patient was maintained on a cardiac monitor.  I personally viewed and interpreted the cardiac monitored which showed an underlying rhythm of: NSR, no ST elevations or evidence of dysrhythmia  Social Determinants of Health:  Factors impacting patients care include: none   Reevaluation: After the interventions noted above, I reevaluated the patient and found that they have :improved  Co morbidities that complicate the patient evaluation  Past Medical History:  Diagnosis Date   Anxiety    CAD (coronary artery disease)    Multivessel status post CABG 1998   Cholelithiasis    Chronic back pain    Colitis, ischemic ([place]) 03/2011   COPD (chronic obstructive pulmonary disease) ([place])    Diverticulosis    Essential hypertension    GERD (gastroesophageal reflux disease)    GI bleed 03/2011   Hiatal hernia    Multiple pulmonary nodules 03/2011   Nephrolithiasis       Dispostion: I considered admission for this patient, but she currently does not meet inpatient criteria for admission is safe for discharge with outpatient follow-up     Final Clinical Impression(s) / ED Diagnoses Final diagnoses:  Acute midline low back pain without sciatica     @PCDICTATION @    Teressa Lower, MD 02/21/22 1536

## 2022-03-12 DIAGNOSIS — I10 Essential (primary) hypertension: Secondary | ICD-10-CM | POA: Diagnosis not present

## 2022-03-12 DIAGNOSIS — R61 Generalized hyperhidrosis: Secondary | ICD-10-CM | POA: Diagnosis not present

## 2022-03-19 DIAGNOSIS — M5137 Other intervertebral disc degeneration, lumbosacral region: Secondary | ICD-10-CM | POA: Diagnosis not present

## 2022-03-19 DIAGNOSIS — I10 Essential (primary) hypertension: Secondary | ICD-10-CM | POA: Diagnosis not present

## 2022-03-19 DIAGNOSIS — E7800 Pure hypercholesterolemia, unspecified: Secondary | ICD-10-CM | POA: Diagnosis not present

## 2022-03-19 DIAGNOSIS — M545 Low back pain, unspecified: Secondary | ICD-10-CM | POA: Diagnosis not present

## 2022-03-19 DIAGNOSIS — M549 Dorsalgia, unspecified: Secondary | ICD-10-CM | POA: Diagnosis not present

## 2022-04-13 DIAGNOSIS — E7800 Pure hypercholesterolemia, unspecified: Secondary | ICD-10-CM | POA: Diagnosis not present

## 2022-04-13 DIAGNOSIS — U071 COVID-19: Secondary | ICD-10-CM | POA: Diagnosis not present

## 2022-04-13 DIAGNOSIS — I10 Essential (primary) hypertension: Secondary | ICD-10-CM | POA: Diagnosis not present

## 2022-04-13 DIAGNOSIS — Z20822 Contact with and (suspected) exposure to covid-19: Secondary | ICD-10-CM | POA: Diagnosis not present

## 2022-04-13 DIAGNOSIS — M545 Low back pain, unspecified: Secondary | ICD-10-CM | POA: Diagnosis not present

## 2022-04-13 DIAGNOSIS — Z951 Presence of aortocoronary bypass graft: Secondary | ICD-10-CM | POA: Diagnosis not present

## 2022-04-13 DIAGNOSIS — Z91041 Radiographic dye allergy status: Secondary | ICD-10-CM | POA: Diagnosis not present

## 2022-04-13 DIAGNOSIS — R0600 Dyspnea, unspecified: Secondary | ICD-10-CM | POA: Diagnosis not present

## 2022-04-13 DIAGNOSIS — Z880 Allergy status to penicillin: Secondary | ICD-10-CM | POA: Diagnosis not present

## 2022-04-23 DIAGNOSIS — M545 Low back pain, unspecified: Secondary | ICD-10-CM | POA: Diagnosis not present

## 2022-04-23 DIAGNOSIS — E441 Mild protein-calorie malnutrition: Secondary | ICD-10-CM | POA: Diagnosis not present

## 2022-04-23 DIAGNOSIS — N179 Acute kidney failure, unspecified: Secondary | ICD-10-CM | POA: Diagnosis not present

## 2022-04-23 DIAGNOSIS — A419 Sepsis, unspecified organism: Secondary | ICD-10-CM | POA: Diagnosis not present

## 2022-04-23 DIAGNOSIS — N390 Urinary tract infection, site not specified: Secondary | ICD-10-CM | POA: Diagnosis not present

## 2022-04-23 DIAGNOSIS — R7989 Other specified abnormal findings of blood chemistry: Secondary | ICD-10-CM | POA: Diagnosis not present

## 2022-04-23 DIAGNOSIS — U071 COVID-19: Secondary | ICD-10-CM | POA: Diagnosis not present

## 2022-04-23 DIAGNOSIS — N1831 Chronic kidney disease, stage 3a: Secondary | ICD-10-CM | POA: Diagnosis not present

## 2022-04-23 DIAGNOSIS — R0602 Shortness of breath: Secondary | ICD-10-CM | POA: Diagnosis not present

## 2022-04-23 DIAGNOSIS — I129 Hypertensive chronic kidney disease with stage 1 through stage 4 chronic kidney disease, or unspecified chronic kidney disease: Secondary | ICD-10-CM | POA: Diagnosis not present

## 2022-04-23 DIAGNOSIS — R0600 Dyspnea, unspecified: Secondary | ICD-10-CM | POA: Diagnosis not present

## 2022-04-23 DIAGNOSIS — R5381 Other malaise: Secondary | ICD-10-CM | POA: Diagnosis not present

## 2022-04-23 DIAGNOSIS — E7800 Pure hypercholesterolemia, unspecified: Secondary | ICD-10-CM | POA: Diagnosis not present

## 2022-04-24 DIAGNOSIS — N390 Urinary tract infection, site not specified: Secondary | ICD-10-CM | POA: Diagnosis not present

## 2022-04-24 DIAGNOSIS — R0602 Shortness of breath: Secondary | ICD-10-CM | POA: Diagnosis not present

## 2022-04-25 DIAGNOSIS — N390 Urinary tract infection, site not specified: Secondary | ICD-10-CM | POA: Diagnosis not present

## 2022-04-26 DIAGNOSIS — N390 Urinary tract infection, site not specified: Secondary | ICD-10-CM | POA: Diagnosis not present

## 2022-05-03 DIAGNOSIS — D649 Anemia, unspecified: Secondary | ICD-10-CM | POA: Diagnosis not present

## 2022-05-03 DIAGNOSIS — Z7689 Persons encountering health services in other specified circumstances: Secondary | ICD-10-CM | POA: Diagnosis not present

## 2022-05-12 DIAGNOSIS — E785 Hyperlipidemia, unspecified: Secondary | ICD-10-CM | POA: Diagnosis not present

## 2022-05-12 DIAGNOSIS — I10 Essential (primary) hypertension: Secondary | ICD-10-CM | POA: Diagnosis not present

## 2022-06-12 DIAGNOSIS — E785 Hyperlipidemia, unspecified: Secondary | ICD-10-CM | POA: Diagnosis not present

## 2022-06-12 DIAGNOSIS — I10 Essential (primary) hypertension: Secondary | ICD-10-CM | POA: Diagnosis not present

## 2022-07-13 DIAGNOSIS — E785 Hyperlipidemia, unspecified: Secondary | ICD-10-CM | POA: Diagnosis not present

## 2022-07-13 DIAGNOSIS — I10 Essential (primary) hypertension: Secondary | ICD-10-CM | POA: Diagnosis not present

## 2022-08-11 DIAGNOSIS — E785 Hyperlipidemia, unspecified: Secondary | ICD-10-CM | POA: Diagnosis not present

## 2022-08-11 DIAGNOSIS — I10 Essential (primary) hypertension: Secondary | ICD-10-CM | POA: Diagnosis not present

## 2022-12-04 ENCOUNTER — Other Ambulatory Visit: Payer: Self-pay

## 2022-12-04 ENCOUNTER — Emergency Department (HOSPITAL_COMMUNITY): Payer: Medicare HMO

## 2022-12-04 ENCOUNTER — Observation Stay (HOSPITAL_COMMUNITY)
Admission: EM | Admit: 2022-12-04 | Discharge: 2022-12-05 | Disposition: A | Discharge status: Home / Self Care | Payer: Medicare HMO | Attending: Internal Medicine | Admitting: Internal Medicine

## 2022-12-04 ENCOUNTER — Encounter (HOSPITAL_COMMUNITY): Payer: Self-pay | Admitting: Hematology

## 2022-12-04 ENCOUNTER — Encounter (HOSPITAL_COMMUNITY): Payer: Self-pay | Admitting: Emergency Medicine

## 2022-12-04 DIAGNOSIS — I2510 Atherosclerotic heart disease of native coronary artery without angina pectoris: Secondary | ICD-10-CM | POA: Insufficient documentation

## 2022-12-04 DIAGNOSIS — N179 Acute kidney failure, unspecified: Secondary | ICD-10-CM

## 2022-12-04 DIAGNOSIS — Z1152 Encounter for screening for COVID-19: Secondary | ICD-10-CM | POA: Insufficient documentation

## 2022-12-04 DIAGNOSIS — E782 Mixed hyperlipidemia: Secondary | ICD-10-CM | POA: Insufficient documentation

## 2022-12-04 DIAGNOSIS — R059 Cough, unspecified: Secondary | ICD-10-CM | POA: Diagnosis present

## 2022-12-04 DIAGNOSIS — Z87891 Personal history of nicotine dependence: Secondary | ICD-10-CM | POA: Insufficient documentation

## 2022-12-04 DIAGNOSIS — D500 Iron deficiency anemia secondary to blood loss (chronic): Secondary | ICD-10-CM | POA: Diagnosis present

## 2022-12-04 DIAGNOSIS — R55 Syncope and collapse: Secondary | ICD-10-CM | POA: Diagnosis not present

## 2022-12-04 DIAGNOSIS — K449 Diaphragmatic hernia without obstruction or gangrene: Secondary | ICD-10-CM

## 2022-12-04 DIAGNOSIS — Z951 Presence of aortocoronary bypass graft: Secondary | ICD-10-CM | POA: Insufficient documentation

## 2022-12-04 DIAGNOSIS — Z79899 Other long term (current) drug therapy: Secondary | ICD-10-CM | POA: Diagnosis not present

## 2022-12-04 DIAGNOSIS — K5900 Constipation, unspecified: Secondary | ICD-10-CM | POA: Insufficient documentation

## 2022-12-04 DIAGNOSIS — E876 Hypokalemia: Secondary | ICD-10-CM | POA: Diagnosis not present

## 2022-12-04 DIAGNOSIS — Z7982 Long term (current) use of aspirin: Secondary | ICD-10-CM | POA: Diagnosis not present

## 2022-12-04 DIAGNOSIS — J441 Chronic obstructive pulmonary disease with (acute) exacerbation: Principal | ICD-10-CM | POA: Diagnosis present

## 2022-12-04 DIAGNOSIS — I10 Essential (primary) hypertension: Secondary | ICD-10-CM | POA: Diagnosis present

## 2022-12-04 DIAGNOSIS — R718 Other abnormality of red blood cells: Secondary | ICD-10-CM | POA: Insufficient documentation

## 2022-12-04 LAB — COMPREHENSIVE METABOLIC PANEL
ALT: 18 U/L (ref 0–44)
AST: 21 U/L (ref 15–41)
Albumin: 4.1 g/dL (ref 3.5–5.0)
Alkaline Phosphatase: 67 U/L (ref 38–126)
Anion gap: 12 (ref 5–15)
BUN: 17 mg/dL (ref 8–23)
CO2: 22 mmol/L (ref 22–32)
Calcium: 9.1 mg/dL (ref 8.9–10.3)
Chloride: 104 mmol/L (ref 98–111)
Creatinine, Ser: 1.46 mg/dL — ABNORMAL HIGH (ref 0.44–1.00)
GFR, Estimated: 36 mL/min — ABNORMAL LOW (ref >60)
Glucose, Bld: 103 mg/dL — ABNORMAL HIGH (ref 70–99)
Potassium: 3.3 mmol/L — ABNORMAL LOW (ref 3.5–5.1)
Sodium: 138 mmol/L (ref 135–145)
Total Bilirubin: 0.9 mg/dL (ref 0.3–1.2)
Total Protein: 7.2 g/dL (ref 6.5–8.1)

## 2022-12-04 LAB — CBC WITH DIFFERENTIAL/PLATELET
Abs Immature Granulocytes: 0.02 10*3/uL (ref 0.00–0.07)
Basophils Absolute: 0.1 10*3/uL (ref 0.0–0.1)
Basophils Relative: 1 %
Eosinophils Absolute: 0.1 10*3/uL (ref 0.0–0.5)
Eosinophils Relative: 1 %
HCT: 48.6 % — ABNORMAL HIGH (ref 36.0–46.0)
Hemoglobin: 14.9 g/dL (ref 12.0–15.0)
Immature Granulocytes: 0 %
Lymphocytes Relative: 28 %
Lymphs Abs: 2.5 10*3/uL (ref 0.7–4.0)
MCH: 23.1 pg — ABNORMAL LOW (ref 26.0–34.0)
MCHC: 30.7 g/dL (ref 30.0–36.0)
MCV: 75.3 fL — ABNORMAL LOW (ref 80.0–100.0)
Monocytes Absolute: 0.6 10*3/uL (ref 0.1–1.0)
Monocytes Relative: 6 %
Neutro Abs: 5.6 10*3/uL (ref 1.7–7.7)
Neutrophils Relative %: 64 %
Platelets: 368 10*3/uL (ref 150–400)
RBC: 6.45 MIL/uL — ABNORMAL HIGH (ref 3.87–5.11)
RDW: 19.1 % — ABNORMAL HIGH (ref 11.5–15.5)
WBC: 8.9 10*3/uL (ref 4.0–10.5)
nRBC: 0 % (ref 0.0–0.2)

## 2022-12-04 LAB — BLOOD GAS, VENOUS
Acid-Base Excess: 0.2 mmol/L (ref 0.0–2.0)
Bicarbonate: 25.4 mmol/L (ref 20.0–28.0)
Drawn by: 44828
FIO2: 21 %
O2 Saturation: 91 %
Patient temperature: 36.4
pCO2, Ven: 41 mmHg — ABNORMAL LOW (ref 44–60)
pH, Ven: 7.4 (ref 7.25–7.43)
pO2, Ven: 60 mmHg — ABNORMAL HIGH (ref 32–45)

## 2022-12-04 LAB — RESP PANEL BY RT-PCR (RSV, FLU A&B, COVID)  RVPGX2
Influenza A by PCR: NEGATIVE
Influenza B by PCR: NEGATIVE
Resp Syncytial Virus by PCR: NEGATIVE
SARS Coronavirus 2 by RT PCR: NEGATIVE

## 2022-12-04 LAB — TROPONIN I (HIGH SENSITIVITY)
Troponin I (High Sensitivity): 5 ng/L (ref <18)
Troponin I (High Sensitivity): 4 ng/L (ref <18)

## 2022-12-04 LAB — LACTIC ACID, PLASMA
Lactic Acid, Venous: 1.6 mmol/L (ref 0.5–1.9)
Lactic Acid, Venous: 1.3 mmol/L (ref 0.5–1.9)

## 2022-12-04 MED ORDER — AMLODIPINE BESYLATE 5 MG PO TABS
5.0000 mg | ORAL_TABLET | Freq: Every day | ORAL | Status: DC
  Administered 2022-12-05: 5 mg via ORAL
  Filled 2022-12-04: qty 1

## 2022-12-04 MED ORDER — ASPIRIN 81 MG PO TBEC
81.0000 mg | DELAYED_RELEASE_TABLET | Freq: Every day | ORAL | Status: DC
  Administered 2022-12-04 – 2022-12-05 (×2): 81 mg via ORAL
  Filled 2022-12-04 (×2): qty 1

## 2022-12-04 MED ORDER — KETOROLAC TROMETHAMINE 15 MG/ML IJ SOLN
15.0000 mg | Freq: Once | INTRAMUSCULAR | Status: AC
  Administered 2022-12-04: 15 mg via INTRAVENOUS
  Filled 2022-12-04: qty 1

## 2022-12-04 MED ORDER — METOPROLOL SUCCINATE ER 50 MG PO TB24
25.0000 mg | ORAL_TABLET | Freq: Two times a day (BID) | ORAL | Status: DC
  Administered 2022-12-04 – 2022-12-05 (×2): 25 mg via ORAL
  Filled 2022-12-04 (×2): qty 1

## 2022-12-04 MED ORDER — ONDANSETRON HCL 4 MG PO TABS: 4.0000 mg | ORAL_TABLET | Freq: Four times a day (QID) | ORAL | Status: DC | PRN

## 2022-12-04 MED ORDER — METHYLPREDNISOLONE SODIUM SUCC 125 MG IJ SOLR
125.0000 mg | Freq: Once | INTRAMUSCULAR | Status: AC
  Administered 2022-12-04: 125 mg via INTRAVENOUS
  Filled 2022-12-04: qty 2

## 2022-12-04 MED ORDER — ACETAMINOPHEN 650 MG RE SUPP: 650.0000 mg | Freq: Four times a day (QID) | RECTAL | Status: DC | PRN

## 2022-12-04 MED ORDER — IPRATROPIUM-ALBUTEROL 0.5-2.5 (3) MG/3ML IN SOLN
3.0000 mL | Freq: Three times a day (TID) | RESPIRATORY_TRACT | Status: DC
  Administered 2022-12-05 (×2): 3 mL via RESPIRATORY_TRACT
  Filled 2022-12-04 (×2): qty 3

## 2022-12-04 MED ORDER — IPRATROPIUM-ALBUTEROL 0.5-2.5 (3) MG/3ML IN SOLN
9.0000 mL | Freq: Once | RESPIRATORY_TRACT | Status: AC
  Administered 2022-12-04: 9 mL via RESPIRATORY_TRACT
  Filled 2022-12-04: qty 9

## 2022-12-04 MED ORDER — POTASSIUM CHLORIDE CRYS ER 20 MEQ PO TBCR
40.0000 meq | EXTENDED_RELEASE_TABLET | Freq: Once | ORAL | Status: AC
  Administered 2022-12-04: 40 meq via ORAL
  Filled 2022-12-04: qty 2

## 2022-12-04 MED ORDER — ATORVASTATIN CALCIUM 40 MG PO TABS
40.0000 mg | ORAL_TABLET | Freq: Every day | ORAL | Status: DC
  Administered 2022-12-04: 40 mg via ORAL
  Filled 2022-12-04: qty 1

## 2022-12-04 MED ORDER — SODIUM CHLORIDE 0.9 % IV SOLN: INTRAVENOUS | Status: AC

## 2022-12-04 MED ORDER — AZITHROMYCIN 250 MG PO TABS
250.0000 mg | ORAL_TABLET | Freq: Every day | ORAL | Status: DC
  Administered 2022-12-05: 250 mg via ORAL
  Filled 2022-12-04: qty 1

## 2022-12-04 MED ORDER — ONDANSETRON HCL 4 MG/2ML IJ SOLN: 4.0000 mg | Freq: Four times a day (QID) | INTRAMUSCULAR | Status: DC | PRN

## 2022-12-04 MED ORDER — PANTOPRAZOLE SODIUM 40 MG PO TBEC
40.0000 mg | DELAYED_RELEASE_TABLET | Freq: Every day | ORAL | Status: DC
  Administered 2022-12-04 – 2022-12-05 (×2): 40 mg via ORAL
  Filled 2022-12-04 (×2): qty 1

## 2022-12-04 MED ORDER — ACETAMINOPHEN 325 MG PO TABS: 650.0000 mg | ORAL_TABLET | Freq: Four times a day (QID) | ORAL | Status: DC | PRN

## 2022-12-04 MED ORDER — GUAIFENESIN-DM 100-10 MG/5ML PO SYRP: 5.0000 mL | ORAL_SOLUTION | ORAL | Status: DC | PRN

## 2022-12-04 MED ORDER — AZITHROMYCIN 250 MG PO TABS
500.0000 mg | ORAL_TABLET | Freq: Every day | ORAL | Status: AC
  Administered 2022-12-04: 500 mg via ORAL
  Filled 2022-12-04: qty 2

## 2022-12-04 MED ORDER — IPRATROPIUM-ALBUTEROL 0.5-2.5 (3) MG/3ML IN SOLN
3.0000 mL | Freq: Four times a day (QID) | RESPIRATORY_TRACT | Status: DC
  Administered 2022-12-04: 3 mL via RESPIRATORY_TRACT
  Filled 2022-12-04: qty 3

## 2022-12-04 MED ORDER — ALBUTEROL SULFATE (2.5 MG/3ML) 0.083% IN NEBU
10.0000 mg/h | INHALATION_SOLUTION | Freq: Once | RESPIRATORY_TRACT | Status: AC
  Administered 2022-12-04: 10 mg/h via RESPIRATORY_TRACT
  Filled 2022-12-04: qty 3

## 2022-12-04 MED ORDER — METHYLPREDNISOLONE SODIUM SUCC 40 MG IJ SOLR
40.0000 mg | Freq: Two times a day (BID) | INTRAMUSCULAR | Status: DC
  Administered 2022-12-05: 40 mg via INTRAVENOUS
  Filled 2022-12-04: qty 1

## 2022-12-04 MED ORDER — IPRATROPIUM-ALBUTEROL 0.5-2.5 (3) MG/3ML IN SOLN: 3.0000 mL | RESPIRATORY_TRACT | Status: DC | PRN

## 2022-12-04 MED ORDER — POLYETHYLENE GLYCOL 3350 17 G PO PACK
17.0000 g | PACK | Freq: Every day | ORAL | Status: DC
  Administered 2022-12-05: 17 g via ORAL
  Filled 2022-12-04: qty 1

## 2022-12-04 MED ORDER — FERROUS SULFATE 325 (65 FE) MG PO TABS
325.0000 mg | ORAL_TABLET | Freq: Every day | ORAL | Status: DC
  Administered 2022-12-05: 325 mg via ORAL
  Filled 2022-12-04: qty 1

## 2022-12-04 MED ORDER — ENOXAPARIN SODIUM 40 MG/0.4ML IJ SOSY
40.0000 mg | PREFILLED_SYRINGE | INTRAMUSCULAR | Status: DC
  Administered 2022-12-04: 40 mg via SUBCUTANEOUS
  Filled 2022-12-04: qty 0.4

## 2022-12-04 NOTE — ED Notes (Signed)
ED TO INPATIENT HANDOFF REPORT  ED Nurse Name and Phone #: Elnita Maxwell [phone]  S Name/Age/Gender [name] 77 y.o. female Room/Bed: APA10/APA10  Code Status   Code Status: Prior  Home/SNF/Other Home Patient oriented to: self, place, time, and situation Is this baseline? Yes   Triage Complete: Triage complete  Chief Complaint COPD with acute exacerbation (HCC) [J44.1]  Triage Note Pt sent from PCP due to persistent productive cough and congestion, concern for possible PNA per provider. Pt is afebrile in triage. She reports constipation since June 27th, sweats, chills, and occasional vomiting.    Allergies Allergies  Allergen Reactions   Lisinopril Swelling    Angioedema, pharyx after endoscopy   Omnipaque [Iohexol] Other (See Comments)    blisters   Penicillins Other (See Comments)    Unknown/ Reaction 25 years    Level of Care/Admitting Diagnosis ED Disposition     ED Disposition  Admit   Condition  --   Comment  Hospital Area: [facility] [[id number]]  Level of Care: Med-Surg [16]  Covid Evaluation: Asymptomatic - no recent exposure (last 10 days) testing not required  Diagnosis: COPD with acute exacerbation [facility]) [[id number]]  Admitting Physician: Frankey Shown [[id number]]  Attending Physician: Frankey Shown [[id number]]          B Medical/Surgery History Past Medical History:  Diagnosis Date   Anxiety    CAD (coronary artery disease)    Multivessel status post CABG 1998   Cholelithiasis    Chronic back pain    Colitis, ischemic (HCC) 03/2011   COPD (chronic obstructive pulmonary disease) (HCC)    Diverticulosis    Essential hypertension    GERD (gastroesophageal reflux disease)    GI bleed 03/2011   Hiatal hernia    Multiple pulmonary nodules 03/2011   Nephrolithiasis    Past Surgical History:  Procedure Laterality Date   ABDOMINAL HYSTERECTOMY     LSO   BACK SURGERY     BIOPSY  03/14/2020   Procedure: BIOPSY;  Surgeon: Lanelle Bal, DO;  Location: [facility];  Service: Endoscopy;;   COLONOSCOPY  04/16/2011   Dr. Darrick Penna: moderate diverticulosis, small internal hemorrhoids, likely ischemic colitis   COLONOSCOPY WITH PROPOFOL N/A 03/14/2020   Procedure: COLONOSCOPY WITH PROPOFOL;  Surgeon: Lanelle Bal, DO;  Location: [facility];  Service: Endoscopy;  Laterality: N/A;  1:00pm   CORONARY ARTERY BYPASS GRAFT  1998   CORONARY STENT PLACEMENT  1995   ESOPHAGOGASTRODUODENOSCOPY N/A 06/23/2013   Dr. Jena Gauss: cervical esophageal web likely s/p dilation with scope, non-critical Schatzki's ring, large hiatal hernia with Sheria Lang lesions, chronic active gastritis   ESOPHAGOGASTRODUODENOSCOPY (EGD) WITH PROPOFOL N/A 03/14/2020   Procedure: ESOPHAGOGASTRODUODENOSCOPY (EGD) WITH PROPOFOL;  Surgeon: Lanelle Bal, DO;  Location: [facility];  Service: Endoscopy;  Laterality: N/A;   GIVENS CAPSULE STUDY N/A 06/26/2020   Procedure: GIVENS CAPSULE STUDY;  Surgeon: Lanelle Bal, DO;  Location: [facility];  Service: Endoscopy;  Laterality: N/A;  7:30am   SALPINGOOPHORECTOMY Left    SMALL INTESTINE SURGERY     SPINAL FUSION     x3     A IV Location/Drains/Wounds Patient Lines/Drains/Airways Status     Active Line/Drains/Airways     Name Placement date Placement time Site Days   Peripheral IV 12/04/22 22 G 1" Anterior;Left Forearm 12/04/22  1154  Forearm  less than 1            Intake/Output Last 24 hours No intake  or output data in the 24 hours ending 12/04/22 1943  Labs/Imaging Results for orders placed or performed during the hospital encounter of 12/04/22 (from the past 48 hour(s))  Resp panel by RT-PCR (RSV, Flu A&B, Covid) Anterior Nasal Swab     Status: None   Collection Time: 12/04/22 11:13 AM   Specimen: Anterior Nasal Swab  Result Value Ref Range   SARS Coronavirus 2 by RT PCR NEGATIVE NEGATIVE    Comment: (NOTE) SARS-CoV-2 target nucleic acids are NOT DETECTED.  The SARS-CoV-2 RNA  is generally detectable in upper respiratory specimens during the acute phase of infection. The lowest concentration of SARS-CoV-2 viral copies this assay can detect is 138 copies/mL. A negative result does not preclude SARS-Cov-2 infection and should not be used as the sole basis for treatment or other patient management decisions. A negative result may occur with  improper specimen collection/handling, submission of specimen other than nasopharyngeal swab, presence of viral mutation(s) within the areas targeted by this assay, and inadequate number of viral copies(<138 copies/mL). A negative result must be combined with clinical observations, patient history, and epidemiological information. The expected result is Negative.  Fact Sheet for Patients:  BloggerCourse.com  Fact Sheet for Healthcare Providers:  SeriousBroker.it  This test is no t yet approved or cleared by the [place] FDA and  has been authorized for detection and/or diagnosis of SARS-CoV-2 by FDA under an Emergency Use Authorization (EUA). This EUA will remain  in effect (meaning this test can be used) for the duration of the COVID-19 declaration under Section 564(b)(1) of the Act, 21 U.S.C.section 360bbb-3(b)(1), unless the authorization is terminated  or revoked sooner.       Influenza A by PCR NEGATIVE NEGATIVE   Influenza B by PCR NEGATIVE NEGATIVE    Comment: (NOTE) The Xpert Xpress SARS-CoV-2/FLU/RSV plus assay is intended as an aid in the diagnosis of influenza from Nasopharyngeal swab specimens and should not be used as a sole basis for treatment. Nasal washings and aspirates are unacceptable for Xpert Xpress SARS-CoV-2/FLU/RSV testing.  Fact Sheet for Patients: BloggerCourse.com  Fact Sheet for Healthcare Providers: SeriousBroker.it  This test is not yet approved or cleared by the [place] FDA  and has been authorized for detection and/or diagnosis of SARS-CoV-2 by FDA under an Emergency Use Authorization (EUA). This EUA will remain in effect (meaning this test can be used) for the duration of the COVID-19 declaration under Section 564(b)(1) of the Act, 21 U.S.C. section 360bbb-3(b)(1), unless the authorization is terminated or revoked.     Resp Syncytial Virus by PCR NEGATIVE NEGATIVE    Comment: (NOTE) Fact Sheet for Patients: BloggerCourse.com  Fact Sheet for Healthcare Providers: SeriousBroker.it  This test is not yet approved or cleared by the [place] FDA and has been authorized for detection and/or diagnosis of SARS-CoV-2 by FDA under an Emergency Use Authorization (EUA). This EUA will remain in effect (meaning this test can be used) for the duration of the COVID-19 declaration under Section 564(b)(1) of the Act, 21 U.S.C. section 360bbb-3(b)(1), unless the authorization is terminated or revoked.  Performed at [facility], [address] [place], [place] [address]   CBC with Differential     Status: Abnormal   Collection Time: 12/04/22 12:27 PM  Result Value Ref Range   WBC 8.9 4.0 - 10.5 K/uL   RBC 6.45 (H) 3.87 - 5.11 MIL/uL   Hemoglobin 14.9 12.0 - 15.0 g/dL   HCT 60.4 (H) 54.0 - 98.1 %  MCV 75.3 (L) 80.0 - 100.0 fL   MCH 23.1 (L) 26.0 - 34.0 pg   MCHC 30.7 30.0 - 36.0 g/dL   RDW 16.1 (H) 09.6 - 04.5 %   Platelets 368 150 - 400 K/uL   nRBC 0.0 0.0 - 0.2 %   Neutrophils Relative % 64 %   Neutro Abs 5.6 1.7 - 7.7 K/uL   Lymphocytes Relative 28 %   Lymphs Abs 2.5 0.7 - 4.0 K/uL   Monocytes Relative 6 %   Monocytes Absolute 0.6 0.1 - 1.0 K/uL   Eosinophils Relative 1 %   Eosinophils Absolute 0.1 0.0 - 0.5 K/uL   Basophils Relative 1 %   Basophils Absolute 0.1 0.0 - 0.1 K/uL   Immature Granulocytes 0 %   Abs Immature Granulocytes 0.02 0.00 - 0.07 K/uL    Comment: Performed at [facility], [address], [place], [place] [address]  Comprehensive metabolic panel     Status: Abnormal   Collection Time: 12/04/22 12:27 PM  Result Value Ref Range   Sodium 138 135 - 145 mmol/L   Potassium 3.3 (L) 3.5 - 5.1 mmol/L   Chloride 104 98 - 111 mmol/L   CO2 22 22 - 32 mmol/L   Glucose, Bld 103 (H) 70 - 99 mg/dL    Comment: Glucose reference range applies only to samples taken after fasting for at least 8 hours.   BUN 17 8 - 23 mg/dL   Creatinine, Ser 1.91 (H) 0.44 - 1.00 mg/dL   Calcium 9.1 8.9 - 47.8 mg/dL   Total Protein 7.2 6.5 - 8.1 g/dL   Albumin 4.1 3.5 - 5.0 g/dL   AST 21 15 - 41 U/L   ALT 18 0 - 44 U/L   Alkaline Phosphatase 67 38 - 126 U/L   Total Bilirubin 0.9 0.3 - 1.2 mg/dL   GFR, Estimated 36 (L) >60 mL/min    Comment: (NOTE) Calculated using the CKD-EPI Creatinine Equation (2021)    Anion gap 12 5 - 15    Comment: Performed at [facility], [address], [place], [place] [address]  Lactic acid, plasma     Status: None   Collection Time: 12/04/22 12:27 PM  Result Value Ref Range   Lactic Acid, Venous 1.6 0.5 - 1.9 mmol/L    Comment: Performed at [facility], [address] [place], [place] [address]  Troponin I (High Sensitivity)     Status: None   Collection Time: 12/04/22 12:27 PM  Result Value Ref Range   Troponin I (High Sensitivity) 5 <18 ng/L    Comment: (NOTE) Elevated high sensitivity troponin I (hsTnI) values and significant  changes across serial measurements may suggest ACS but many other  chronic and acute conditions are known to elevate hsTnI results.  Refer to the "Links" section for chest pain algorithms and additional  guidance. Performed at [facility], [address], [place], [place] [address]   Blood culture (routine x 2)     Status: None (Preliminary result)   Collection Time: 12/04/22 12:27 PM   Specimen: BLOOD  Result Value Ref Range   Specimen Description BLOOD BLOOD LEFT FOREARM    Special Requests      BOTTLES DRAWN AEROBIC  AND ANAEROBIC Blood Culture results may not be optimal due to an inadequate volume of blood received in culture bottles Performed at [facility], [address], [place], [place] [address]    Culture PENDING    Report Status PENDING   Blood  culture (routine x 2)     Status: None (Preliminary result)   Collection Time: 12/04/22 12:27 PM   Specimen: BLOOD  Result Value Ref Range   Specimen Description BLOOD LEFT ANTECUBITAL    Special Requests      BOTTLES DRAWN AEROBIC AND ANAEROBIC Blood Culture adequate volume Performed at [facility], [address], [place], [place] [address]    Culture PENDING    Report Status PENDING   Blood gas, venous (at [facility] and [facility])     Status: Abnormal   Collection Time: 12/04/22 12:27 PM  Result Value Ref Range   FIO2 21.00 %   pH, Ven 7.4 7.25 - 7.43   pCO2, Ven 41 (L) 44 - 60 mmHg   pO2, Ven 60 (H) 32 - 45 mmHg   Bicarbonate 25.4 20.0 - 28.0 mmol/L   Acid-Base Excess 0.2 0.0 - 2.0 mmol/L   O2 Saturation 91 %   Patient temperature 36.4    Collection site BLOOD LEFT FOREARM    Drawn by [phone]     Comment: Performed at [facility], [address] [place], [place] [address]  Lactic acid, plasma     Status: None   Collection Time: 12/04/22  2:27 PM  Result Value Ref Range   Lactic Acid, Venous 1.3 0.5 - 1.9 mmol/L    Comment: Performed at [facility], [address] [place], [place] [address]  Troponin I (High Sensitivity)     Status: None   Collection Time: 12/04/22  2:59 PM  Result Value Ref Range   Troponin I (High Sensitivity) 4 <18 ng/L    Comment: (NOTE) Elevated high sensitivity troponin I (hsTnI) values and significant  changes across serial measurements may suggest ACS but many other  chronic and acute conditions are known to elevate hsTnI results.  Refer to the "Links" section for chest pain algorithms and additional  guidance. Performed at [facility], [address] [place], [place] [address]    CT CHEST ABDOMEN PELVIS WO  CONTRAST  Result Date: 12/04/2022 CLINICAL DATA:  Unintended weight loss, cough, constipation EXAM: CT CHEST, ABDOMEN AND PELVIS WITHOUT CONTRAST TECHNIQUE: Multidetector CT imaging of the chest, abdomen and pelvis was performed following the standard protocol without IV contrast. RADIATION DOSE REDUCTION: This exam was performed according to the departmental dose-optimization program which includes automated exposure control, adjustment of the mA and/or kV according to patient size and/or use of iterative reconstruction technique. COMPARISON:  CT abdomen and pelvis done on 03/14/2014 FINDINGS: CT CHEST FINDINGS Cardiovascular: Coronary artery calcifications are seen. There is previous coronary bypass surgery. Calcifications are seen in thoracic aorta and its major branches. Mediastinum/Nodes: No significant lymphadenopathy is seen. Lungs/Pleura: There is no focal pulmonary consolidation. No discrete lung nodules are seen. There is increase in interstitial markings in the periphery of both lungs suggesting scarring. There is no pleural effusion or pneumothorax. Musculoskeletal: Degenerative changes are noted in thoracic spine with disc space narrowing and bony spurs at multiple levels. CT ABDOMEN PELVIS FINDINGS Hepatobiliary: No focal abnormalities are seen in liver. There is no dilation of bile ducts. Large gallbladder stone is seen. There is no wall thickening in gallbladder. Pancreas: No focal abnormalities are seen. Spleen: Unremarkable. Adrenals/Urinary Tract: Adrenals are unremarkable. There is no hydronephrosis. There is 1 mm punctate calculus in the midportion of right kidney. Ureters are unremarkable. Urinary bladder is unremarkable. Stomach/Bowel: Moderate sized hiatal hernia is seen. Stomach is not distended. Small bowel loops are not dilated. Appendix is difficult to  visualize. Days gas-filled tubular structure adjacent to the cecum, possibly normal appendix. There is no significant wall thickening in  colon. There is no pericolic stranding. Multiple diverticula seen in colon, especially in sigmoid without signs of focal acute diverticulitis. There is no pericolic stranding or fluid collection. Vascular/Lymphatic: Scattered arterial calcifications are seen. No significant lymphadenopathy is seen. Reproductive: There are coarse calcifications in uterus suggesting fibroids. Other: There is no ascites or pneumoperitoneum. Small umbilical hernia containing fat is seen. Musculoskeletal: Degenerative changes are noted with encroachment of the neural foramina by bony spurs and bulging of the annulus at multiple levels in lumbar spine. IMPRESSION: No acute findings are seen in noncontrast CT chest, abdomen and pelvis. There is no significant lymphadenopathy. There are no infiltrates or discrete nodules in the lung fields.There is no evidence of intestinal obstruction or pneumoperitoneum. There is no hydronephrosis. Coronary artery disease. Aortic arteriosclerosis. Gallbladder stone. Moderate sized fixed hiatal hernia. There is a punctate 1 mm calcification in the midportion of right kidney which may be vascular or suggest nonobstructing calculus. Diverticulosis of colon without signs of focal diverticulitis. Electronically Signed   By: Ernie Avena M.D.   On: 12/04/2022 16:34   DG Chest 2 View  Result Date: 12/04/2022 CLINICAL DATA:  Cough EXAM: CHEST - 2 VIEW COMPARISON:  Previous studies including the examination of 02/21/2022 FINDINGS: Cardiac size is within normal limits. There is previous coronary bypass surgery. There is lucency in the retrocardiac region suggesting moderate sized hiatal hernia. There are no signs of pulmonary edema or focal pulmonary consolidation. There is no pleural effusion or pneumothorax. IMPRESSION: There are no signs of pulmonary edema or focal pulmonary consolidation. Moderate sized fixed hiatal hernia. Electronically Signed   By: Ernie Avena M.D.   On: 12/04/2022 11:56     Pending Labs Unresulted Labs (From admission, onward)    None       Vitals/Pain Today's Vitals   12/04/22 1545 12/04/22 1850 12/04/22 1930 12/04/22 1942  BP: (!) 163/76 (!) 134/52 (!) 117/55   Pulse: 77 71 71   Resp: 18  16   Temp: 98.3 F (36.8 C)   98.6 F (37 C)  TempSrc:    Oral  SpO2: 95% 96% 96%   Weight:      Height:      PainSc:        Isolation Precautions No active isolations  Medications Medications  ipratropium-albuterol (DUONEB) 0.5-2.5 (3) MG/3ML nebulizer solution 9 mL (9 mLs Nebulization Given 12/04/22 1134)  methylPREDNISolone sodium succinate (SOLU-MEDROL) 125 mg/2 mL injection 125 mg (125 mg Intravenous Given 12/04/22 1158)  albuterol (PROVENTIL) (2.5 MG/3ML) 0.083% nebulizer solution (10 mg/hr Nebulization Given 12/04/22 1304)  ketorolac (TORADOL) 15 MG/ML injection 15 mg (15 mg Intravenous Given 12/04/22 1434)    Mobility walks     Focused Assessments Pulmonary Assessment Handoff:  Lung sounds: Bilateral Breath Sounds: Expiratory wheezes, Diminished O2 Device: Room Air      R Recommendations: See Admitting Provider Note  Report given to:   Additional Notes: A&O x4, pt reports had recent birthday [birth year] and started to feel bad then, cough and chills. Pt report lives alone at home, is ambulatory and involved with her church. Sweet lady, has glasses, can ambulate to bathroom.

## 2022-12-04 NOTE — ED Notes (Signed)
Both sets of blood cultures administered before any antibiotic administration

## 2022-12-04 NOTE — ED Notes (Signed)
Patient transported to X-ray 

## 2022-12-04 NOTE — ED Provider Notes (Signed)
[place] EMERGENCY DEPARTMENT AT [facility] Provider Note  CSN: [id number] Arrival date & time: 12/04/22 1030  Chief Complaint(s) Cough  HPI [name] is a 77 y.o. female with PMH ischemic colitis, COPD, GERD, GI bleeds, CAD status post CABG who presents emergency department for evaluation of multiple complaints including cough, shortness of breath and diaphoresis.  Patient states that she feels that she has had symptoms since May 2024 but symptoms have significantly worsened over the last 1 week.  She saw her primary care physician who heard an abnormal pulmonary exam and sent the patient to the emergency department for further evaluation.  Here in the emergency room, patient arrives mildly diaphoretic and is ill-appearing but vital signs are normal.  She endorses shortness of breath and cough but denies abdominal pain, nausea, vomiting, headache or other systemic symptoms.  She states that every time she has minimal exertion she breaks out into a sweat.   Past Medical History Past Medical History:  Diagnosis Date   Anxiety    CAD (coronary artery disease)    Multivessel status post CABG 1998   Cholelithiasis    Chronic back pain    Colitis, ischemic (HCC) 03/2011   COPD (chronic obstructive pulmonary disease) (HCC)    Diverticulosis    Essential hypertension    GERD (gastroesophageal reflux disease)    GI bleed 03/2011   Hiatal hernia    Multiple pulmonary nodules 03/2011   Nephrolithiasis    Patient Active Problem List   Diagnosis Date Noted   Iron deficiency anemia due to chronic blood loss 07/28/2020   Angioedema 05/24/2020   Anemia 02/17/2020   History of GI bleed 02/17/2020   Sepsis (HCC) 06/07/2016   Acute bronchitis 06/07/2016   Essential hypertension 03/17/2014   Hypokalemia 03/17/2014   Volume overload 03/17/2014   Dehydration 03/16/2014   COPD (chronic obstructive pulmonary disease) (HCC) 03/15/2014   Chest pain 03/15/2014   Lower GI bleed  03/14/2014   GIB (gastrointestinal bleeding) 03/14/2014   COPD with acute exacerbation (HCC) 06/17/2013   Colitis 06/03/2011    Class: Hospitalized for   Home Medication(s) Prior to Admission medications   Medication Sig Start Date End Date Taking? Authorizing Provider  acetaminophen (TYLENOL) 650 MG CR tablet Take 650 mg by mouth every 8 (eight) hours as needed for pain.    [provider]  amLODipine (NORVASC) 5 MG tablet  05/26/20   [provider]  aspirin EC 81 MG tablet Take 81 mg by mouth daily. Swallow whole.    [provider]  atorvastatin (LIPITOR) 40 MG tablet Take 40 mg by mouth at bedtime.  04/24/13   [provider]  cholecalciferol (VITAMIN D) 1000 UNITS tablet Take 1,000 Units by mouth daily.    [provider]  ibuprofen (ADVIL) 800 MG tablet Take 800 mg by mouth every 6 (six) hours as needed for mild pain.    [provider]  lidocaine (LIDODERM) 5 % Place 1 patch onto the skin daily. Remove & Discard patch within 12 hours or as directed by MD 02/21/22   Shady Bradish, Wyn Forster, MD  loratadine (CLARITIN) 10 MG tablet Take 10 mg by mouth daily.    [provider]  metoprolol succinate (TOPROL-XL) 25 MG 24 hr tablet Take 25 mg by mouth 2 (two) times daily.    [provider]  oxyCODONE (ROXICODONE) 5 MG immediate release tablet Take 1 tablet (5 mg total) by mouth every 4 (four) hours as needed for  severe pain. 02/21/22   Camilah Spillman, MD  pantoprazole (PROTONIX) 40 MG tablet Take 1 tablet (40 mg total) by mouth 2 (two) times daily. Patient taking differently: Take 40 mg by mouth daily. 06/24/13   Benetta Spar, MD  lisinopril (PRINIVIL,ZESTRIL) 10 MG tablet Take 10 mg by mouth daily.    03/18/20  [provider]                                                                                                                                    Past Surgical History Past Surgical History:   Procedure Laterality Date   ABDOMINAL HYSTERECTOMY     LSO   BACK SURGERY     BIOPSY  03/14/2020   Procedure: BIOPSY;  Surgeon: Lanelle Bal, DO;  Location: [facility];  Service: Endoscopy;;   COLONOSCOPY  04/16/2011   Dr. Darrick Penna: moderate diverticulosis, small internal hemorrhoids, likely ischemic colitis   COLONOSCOPY WITH PROPOFOL N/A 03/14/2020   Procedure: COLONOSCOPY WITH PROPOFOL;  Surgeon: Lanelle Bal, DO;  Location: [facility];  Service: Endoscopy;  Laterality: N/A;  1:00pm   CORONARY ARTERY BYPASS GRAFT  1998   CORONARY STENT PLACEMENT  1995   ESOPHAGOGASTRODUODENOSCOPY N/A 06/23/2013   Dr. Jena Gauss: cervical esophageal web likely s/p dilation with scope, non-critical Schatzki's ring, large hiatal hernia with Sheria Lang lesions, chronic active gastritis   ESOPHAGOGASTRODUODENOSCOPY (EGD) WITH PROPOFOL N/A 03/14/2020   Procedure: ESOPHAGOGASTRODUODENOSCOPY (EGD) WITH PROPOFOL;  Surgeon: Lanelle Bal, DO;  Location: [facility];  Service: Endoscopy;  Laterality: N/A;   GIVENS CAPSULE STUDY N/A 06/26/2020   Procedure: GIVENS CAPSULE STUDY;  Surgeon: Lanelle Bal, DO;  Location: [facility];  Service: Endoscopy;  Laterality: N/A;  7:30am   SALPINGOOPHORECTOMY Left    SMALL INTESTINE SURGERY     SPINAL FUSION     x3   Family History Family History  Problem Relation Age of Onset   Stomach cancer Mother 1   Breast cancer Sister 74   Diabetes Mellitus II Sister    Colon cancer Neg Hx    Esophageal cancer Neg Hx     Social History Social History   Tobacco Use   Smoking status: Former    Packs/day: 1.00    Years: 40.00    Additional pack years: 0.00    Total pack years: 40.00    Types: Cigarettes    Quit date: 09/14/2010    Years since quitting: 12.2   Smokeless tobacco: Never  Substance Use Topics   Alcohol use: No   Drug use: No    Comment: marijuana (QUIT couple yrs ago)   Allergies Lisinopril, Omnipaque [iohexol], and  Penicillins  Review of Systems Review of Systems  Constitutional:  Positive for chills, diaphoresis and fever.  Respiratory:  Positive for cough and shortness of breath.     Physical Exam Vital Signs  I have reviewed the triage  vital signs BP 111/71 (BP Location: Right Arm)   Pulse 63   Temp 98.4 F (36.9 C) (Oral)   Resp (!) 22   Ht 5\' 4"  (1.626 m)   Wt 77.6 kg   SpO2 100%   BMI 29.35 kg/m   Physical Exam Vitals and nursing note reviewed.  Constitutional:      General: She is not in acute distress.    Appearance: She is well-developed. She is ill-appearing.  HENT:     Head: Normocephalic and atraumatic.  Eyes:     Conjunctiva/sclera: Conjunctivae normal.  Cardiovascular:     Rate and Rhythm: Normal rate and regular rhythm.     Heart sounds: No murmur heard. Pulmonary:     Effort: Pulmonary effort is normal. No respiratory distress.     Breath sounds: Wheezing and rhonchi present. No rales.  Musculoskeletal:        General: No swelling.     Cervical back: Neck supple.  Skin:    General: Skin is warm and dry.     Capillary Refill: Capillary refill takes less than 2 seconds.  Neurological:     Mental Status: She is alert.  Psychiatric:        Mood and Affect: Mood normal.     ED Results and Treatments Labs (all labs ordered are listed, but only abnormal results are displayed) Labs Reviewed  RESP PANEL BY RT-PCR (RSV, FLU A&B, COVID)  RVPGX2  CULTURE, BLOOD (ROUTINE X 2)  CULTURE, BLOOD (ROUTINE X 2)  CBC WITH DIFFERENTIAL/PLATELET  COMPREHENSIVE METABOLIC PANEL  LACTIC ACID, PLASMA  LACTIC ACID, PLASMA  TROPONIN I (HIGH SENSITIVITY)                                                                                                                          Radiology No results found.  Pertinent labs & imaging results that were available during my care of the patient were reviewed by me and considered in my medical decision making (see MDM for  details).  Medications Ordered in ED Medications  ipratropium-albuterol (DUONEB) 0.5-2.5 (3) MG/3ML nebulizer solution 9 mL (has no administration in time range)  methylPREDNISolone sodium succinate (SOLU-MEDROL) 125 mg/2 mL injection 125 mg (has no administration in time range)  Procedures .Critical Care  Performed by: Glendora Score, MD Authorized by: Glendora Score, MD   Critical care provider statement:    Critical care time (minutes):  30   Critical care was necessary to treat or prevent imminent or life-threatening deterioration of the following conditions:  Respiratory failure   Critical care was time spent personally by me on the following activities:  Development of treatment plan with patient or surrogate, discussions with consultants, evaluation of patient's response to treatment, examination of patient, ordering and review of laboratory studies, ordering and review of radiographic studies, ordering and performing treatments and interventions, pulse oximetry, re-evaluation of patient's condition and review of old charts   (including critical care time)  Medical Decision Making / ED Course   This patient presents to the ED for concern of cough, diaphoresis, shortness of breath, this involves an extensive number of treatment options, and is a complaint that carries with it a high risk of complications and morbidity.  The differential diagnosis includes Pe, PTX, Pulmonary Edema, ARDS, COPD/Asthma, ACS, CHF exacerbation, Arrhythmia, Pericardial Effusion/Tamponade, Anemia, Sepsis, Acidosis/Hypercapnia, Anxiety, Viral URI, malignancy  MDM: Patient seen emergency room for evaluation of multiple complaints described above.  Physical exam with significant expiratory wheezing bilaterally but is otherwise unremarkable.  Laboratory evaluation with a creatinine  of 1.46, mild hypokalemia to 3.3 but is otherwise unremarkable.  COVID, flu, RSV negative and obtained in the setting of chills, cough and shortness of breath.  Chest x-ray largely unremarkable.  Patient received 3 DuoNebs and methylprednisolone and on reevaluation wheezing starting to improve but still persistent.  She received an additional 10 mg continuous albuterol treatment and on second reevaluation, again physical exam is improving but patient still feeling very short of breath.  In the setting of her unexplained diaphoresis and night sweats, CT chest abdomen pelvis was obtained to rule out malignancy and that this is pending at time of signout.  Patient will ultimately require hospital admission for persistent COPD exacerbation causing persistent dyspnea on exertion.  Pursue provider signout for continuation of workup   Additional history obtained:  -External records from outside source obtained and reviewed including: Chart review including previous notes, labs, imaging, consultation notes   Lab Tests: -I ordered, reviewed, and interpreted labs.   The pertinent results include:   Labs Reviewed  RESP PANEL BY RT-PCR (RSV, FLU A&B, COVID)  RVPGX2  CULTURE, BLOOD (ROUTINE X 2)  CULTURE, BLOOD (ROUTINE X 2)  CBC WITH DIFFERENTIAL/PLATELET  COMPREHENSIVE METABOLIC PANEL  LACTIC ACID, PLASMA  LACTIC ACID, PLASMA  TROPONIN I (HIGH SENSITIVITY)       Imaging Studies ordered: I ordered imaging studies including chest x-ray, CT CAP I independently visualized and interpreted imaging. I agree with the radiologist interpretation   Medicines ordered and prescription drug management: Meds ordered this encounter  Medications   ipratropium-albuterol (DUONEB) 0.5-2.5 (3) MG/3ML nebulizer solution 9 mL   methylPREDNISolone sodium succinate (SOLU-MEDROL) 125 mg/2 mL injection 125 mg    -I have reviewed the patients home medicines and have made adjustments as needed  Critical  interventions Multiple DuoNebs, steroids    Cardiac Monitoring: The patient was maintained on a cardiac monitor.  I personally viewed and interpreted the cardiac monitored which showed an underlying rhythm of: NSR  Social Determinants of Health:  Factors impacting patients care include: none   Reevaluation: After the interventions noted above, I reevaluated the patient and found that they have :improved  Co morbidities that complicate the patient evaluation  Past Medical  History:  Diagnosis Date   Anxiety    CAD (coronary artery disease)    Multivessel status post CABG 1998   Cholelithiasis    Chronic back pain    Colitis, ischemic (HCC) 03/2011   COPD (chronic obstructive pulmonary disease) (HCC)    Diverticulosis    Essential hypertension    GERD (gastroesophageal reflux disease)    GI bleed 03/2011   Hiatal hernia    Multiple pulmonary nodules 03/2011   Nephrolithiasis       Dispostion: I considered admission for this patient, and disposition pending completion of imaging.  Anticipate admission.  Please see provider signout for continuation of workup.     Final Clinical Impression(s) / ED Diagnoses Final diagnoses:  None     @PCDICTATION @    Glendora Score, MD 12/04/22 1725

## 2022-12-04 NOTE — ED Notes (Signed)
Attempted to call report to receiving RN 2a, bed has been ready and assigned for "48" mins at this time, handoff report was also placed in pt's chart at "1943", receiving RN unable to take report at this time, will need to call back, charge RN notified of delay

## 2022-12-04 NOTE — ED Triage Notes (Signed)
Pt sent from PCP due to persistent productive cough and congestion, concern for possible PNA per provider. Pt is afebrile in triage. She reports constipation since June 27th, sweats, chills, and occasional vomiting.

## 2022-12-04 NOTE — H&P (Signed)
History and Physical    Patient: [name]:[id number] DOB: [birth year] DOA: 12/04/2022 DOS: the patient was seen and examined on 12/04/2022 PCP: Valerie Spar, MD  Patient coming from: Home  Chief Complaint:  Chief Complaint  Patient presents with   Cough   HPI: [name] is a 77 y.o. female with medical history significant of iron deficiency anemia, COPD, hypertension, hyperlipidemia, GERD, CAD s/p CABG who presents to the emergency department after being sent to the ED from her PCP due to abnormal pulmonary exam for further evaluation and management.  She complained of cough, wheezing, diaphoresis and tiredness with sensation that she was going to faint which usually occur on exertion and has been ongoing since May of this year, she also endorsed constipation which required taking some laxatives and she had to do some manual disimpaction which caused some rectal bleeding, patient states that she has not had any other bowel movement since then (several weeks).  She reported worsening of wheezing and diaphoresis on ambulation within the last week and on following up with PCP for regular checkup, she was asked to go to the ED after being examined.  She endorsed possible insect bite on right thigh which she noted this morning, patient denies any awareness of when the bite occurred or what insect bit her.  She denies palpitations, fever, chest pain, shortness of breath, nausea, vomiting, history of GI bleed or abdominal pain.  ED Course:  In the emergency department, pulse was 57 bpm, BP was 143/92, other vital signs are within normal range.  Workup in the ED showed WBC 8.9, hemoglobin 14.9, hematocrit 48.6, MCV 75.3, platelets 368.  BMP was normal except for potassium of 3.3, blood glucose 103 and creatinine of 1.46 (baseline creatinine at 0.9).  Lactic acid was normal, troponin x 2 was negative.  Influenza A, B, SARS coronavirus 2, RSV was negative.  Blood culture pending. Chest x-ray  showed no signs of pulmonary edema or focal pulmonary consolidation.  Moderate sized fixed hiatal hernia. CT chest, abdomen and pelvis without contrast showed no acute findings. She was provided with breathing treatment, Solu-Medrol 125 mg x 1 was given, Toradol was given.  Hospitalist was asked to admit patient for further evaluation and management.  Review of Systems: Review of systems as noted in the HPI. All other systems reviewed and are negative.   Past Medical History:  Diagnosis Date   Anxiety    CAD (coronary artery disease)    Multivessel status post CABG 1998   Cholelithiasis    Chronic back pain    Colitis, ischemic (HCC) 03/2011   COPD (chronic obstructive pulmonary disease) (HCC)    Diverticulosis    Essential hypertension    GERD (gastroesophageal reflux disease)    GI bleed 03/2011   Hiatal hernia    Multiple pulmonary nodules 03/2011   Nephrolithiasis    Past Surgical History:  Procedure Laterality Date   ABDOMINAL HYSTERECTOMY     LSO   BACK SURGERY     BIOPSY  03/14/2020   Procedure: BIOPSY;  Surgeon: Valerie Bal, DO;  Location: [facility];  Service: Endoscopy;;   COLONOSCOPY  04/16/2011   Dr. Darrick [name]: moderate diverticulosis, small internal hemorrhoids, likely ischemic colitis   COLONOSCOPY WITH PROPOFOL N/A 03/14/2020   Procedure: COLONOSCOPY WITH PROPOFOL;  Surgeon: Valerie Bal, DO;  Location: [facility];  Service: Endoscopy;  Laterality: N/A;  1:00pm   CORONARY ARTERY BYPASS GRAFT  1998   CORONARY  STENT PLACEMENT  1995   ESOPHAGOGASTRODUODENOSCOPY N/A 06/23/2013   Dr. Jena [name]: cervical esophageal web likely s/p dilation with scope, non-critical Schatzki's ring, large hiatal hernia with [name] lesions, chronic active gastritis   ESOPHAGOGASTRODUODENOSCOPY (EGD) WITH PROPOFOL N/A 03/14/2020   Procedure: ESOPHAGOGASTRODUODENOSCOPY (EGD) WITH PROPOFOL;  Surgeon: Valerie Bal, DO;  Location: [facility];  Service: Endoscopy;   Laterality: N/A;   GIVENS CAPSULE STUDY N/A 06/26/2020   Procedure: GIVENS CAPSULE STUDY;  Surgeon: Valerie Bal, DO;  Location: [facility];  Service: Endoscopy;  Laterality: N/A;  7:30am   SALPINGOOPHORECTOMY Left    SMALL INTESTINE SURGERY     SPINAL FUSION     x3    Social History:  reports that she quit smoking about 12 years ago. Her smoking use included cigarettes. She has a 40.00 pack-year smoking history. She has never used smokeless tobacco. She reports that she does not drink alcohol and does not use drugs.   Allergies  Allergen Reactions   Lisinopril Swelling    Angioedema, pharyx after endoscopy   Omnipaque [Iohexol] Other (See Comments)    blisters   Penicillins Other (See Comments)    Unknown/ Reaction 25 years    Family History  Problem Relation Age of Onset   Stomach cancer Mother 68   Breast cancer Sister 68   Diabetes Mellitus II Sister    Colon cancer Neg Hx    Esophageal cancer Neg Hx      Prior to Admission medications   Medication Sig Start Date End Date Taking? Authorizing Provider  acetaminophen (TYLENOL) 650 MG CR tablet Take 650 mg by mouth every 8 (eight) hours as needed for pain.    [provider]  amLODipine (NORVASC) 5 MG tablet  05/26/20   [provider]  aspirin EC 81 MG tablet Take 81 mg by mouth daily. Swallow whole.    [provider]  atorvastatin (LIPITOR) 40 MG tablet Take 40 mg by mouth at bedtime.  04/24/13   [provider]  cholecalciferol (VITAMIN D) 1000 UNITS tablet Take 1,000 Units by mouth daily.    [provider]  ibuprofen (ADVIL) 800 MG tablet Take 800 mg by mouth every 6 (six) hours as needed for mild pain.    [provider]  lidocaine (LIDODERM) 5 % Place 1 patch onto the skin daily. Remove & Discard patch within 12 hours or as directed by MD 02/21/22   [name], Valerie Forster, MD  loratadine (CLARITIN) 10 MG tablet Take 10 mg by mouth daily.    [provider]   metoprolol succinate (TOPROL-XL) 25 MG 24 hr tablet Take 25 mg by mouth 2 (two) times daily.    [provider]  oxyCODONE (ROXICODONE) 5 MG immediate release tablet Take 1 tablet (5 mg total) by mouth every 4 (four) hours as needed for severe pain. 02/21/22   [name], [place], MD  pantoprazole (PROTONIX) 40 MG tablet Take 1 tablet (40 mg total) by mouth 2 (two) times daily. Patient taking differently: Take 40 mg by mouth daily. 06/24/13   Valerie Spar, MD  lisinopril (PRINIVIL,ZESTRIL) 10 MG tablet Take 10 mg by mouth daily.    03/18/20  [provider]    Physical Exam: BP 138/74 (BP Location: Right Arm)   Pulse 67   Temp 97.7 F (36.5 C) (Oral)   Resp 16   Ht 5\' 4"  (1.626 m)   Wt 79.3 kg   SpO2 100%   BMI 30.01 kg/m  General: 77 y.o. year-old ill-appearing, but in no acute distress.  Alert and oriented x3. HEENT: NCAT, EOMI Neck: Supple, trachea medial Cardiovascular: Regular rate and rhythm with no rubs or gallops.  No thyromegaly or JVD noted.  No lower extremity edema. 2/4 pulses in all 4 extremities. Respiratory: Diffuse rhonchi and mild wheezing on auscultation. Abdomen: Soft, nontender nondistended with normal bowel sounds x4 quadrants. Muskuloskeletal: No cyanosis, clubbing or edema noted bilaterally Neuro: CN II-XII intact, strength 5/5 x 4, sensation, reflexes intact Skin: No ulcerative lesions noted or rashes Psychiatry: Judgement and insight appear normal. Mood is appropriate for condition and setting          Labs on Admission:  Basic Metabolic Panel: Recent Labs  Lab 12/04/22 1227  NA 138  K 3.3*  CL 104  CO2 22  GLUCOSE 103*  BUN 17  CREATININE 1.46*  CALCIUM 9.1   Liver Function Tests: Recent Labs  Lab 12/04/22 1227  AST 21  ALT 18  ALKPHOS 67  BILITOT 0.9  PROT 7.2  ALBUMIN 4.1   No results for input(s): "LIPASE", "AMYLASE" in the last 168 hours. No results for input(s): "AMMONIA" in the last 168  hours. CBC: Recent Labs  Lab 12/04/22 1227  WBC 8.9  NEUTROABS 5.6  HGB 14.9  HCT 48.6*  MCV 75.3*  PLT 368   Cardiac Enzymes: No results for input(s): "CKTOTAL", "CKMB", "CKMBINDEX", "TROPONINI" in the last 168 hours.  BNP (last 3 results) No results for input(s): "BNP" in the last 8760 hours.  ProBNP (last 3 results) No results for input(s): "PROBNP" in the last 8760 hours.  CBG: No results for input(s): "GLUCAP" in the last 168 hours.  Radiological Exams on Admission: CT CHEST ABDOMEN PELVIS WO CONTRAST  Result Date: 12/04/2022 CLINICAL DATA:  Unintended weight loss, cough, constipation EXAM: CT CHEST, ABDOMEN AND PELVIS WITHOUT CONTRAST TECHNIQUE: Multidetector CT imaging of the chest, abdomen and pelvis was performed following the standard protocol without IV contrast. RADIATION DOSE REDUCTION: This exam was performed according to the departmental dose-optimization program which includes automated exposure control, adjustment of the mA and/or kV according to patient size and/or use of iterative reconstruction technique. COMPARISON:  CT abdomen and pelvis done on 03/14/2014 FINDINGS: CT CHEST FINDINGS Cardiovascular: Coronary artery calcifications are seen. There is previous coronary bypass surgery. Calcifications are seen in thoracic aorta and its major branches. Mediastinum/Nodes: No significant lymphadenopathy is seen. Lungs/Pleura: There is no focal pulmonary consolidation. No discrete lung nodules are seen. There is increase in interstitial markings in the periphery of both lungs suggesting scarring. There is no pleural effusion or pneumothorax. Musculoskeletal: Degenerative changes are noted in thoracic spine with disc space narrowing and bony spurs at multiple levels. CT ABDOMEN PELVIS FINDINGS Hepatobiliary: No focal abnormalities are seen in liver. There is no dilation of bile ducts. Large gallbladder stone is seen. There is no wall thickening in gallbladder. Pancreas: No  focal abnormalities are seen. Spleen: Unremarkable. Adrenals/Urinary Tract: Adrenals are unremarkable. There is no hydronephrosis. There is 1 mm punctate calculus in the midportion of right kidney. Ureters are unremarkable. Urinary bladder is unremarkable. Stomach/Bowel: Moderate sized hiatal hernia is seen. Stomach is not distended. Small bowel loops are not dilated. Appendix is difficult to visualize. Days gas-filled tubular structure adjacent to the cecum, possibly normal appendix. There is no significant wall thickening in colon. There is no pericolic stranding. Multiple diverticula seen in colon, especially in sigmoid without signs of focal acute diverticulitis. There is no pericolic stranding or  fluid collection. Vascular/Lymphatic: Scattered arterial calcifications are seen. No significant lymphadenopathy is seen. Reproductive: There are coarse calcifications in uterus suggesting fibroids. Other: There is no ascites or pneumoperitoneum. Small umbilical hernia containing fat is seen. Musculoskeletal: Degenerative changes are noted with encroachment of the neural foramina by bony spurs and bulging of the annulus at multiple levels in lumbar spine. IMPRESSION: No acute findings are seen in noncontrast CT chest, abdomen and pelvis. There is no significant lymphadenopathy. There are no infiltrates or discrete nodules in the lung fields.There is no evidence of intestinal obstruction or pneumoperitoneum. There is no hydronephrosis. Coronary artery disease. Aortic arteriosclerosis. Gallbladder stone. Moderate sized fixed hiatal hernia. There is a punctate 1 mm calcification in the midportion of right kidney which may be vascular or suggest nonobstructing calculus. Diverticulosis of colon without signs of focal diverticulitis. Electronically Signed   By: Ernie Avena M.D.   On: 12/04/2022 16:34   DG Chest 2 View  Result Date: 12/04/2022 CLINICAL DATA:  Cough EXAM: CHEST - 2 VIEW COMPARISON:  Previous  studies including the examination of 02/21/2022 FINDINGS: Cardiac size is within normal limits. There is previous coronary bypass surgery. There is lucency in the retrocardiac region suggesting moderate sized hiatal hernia. There are no signs of pulmonary edema or focal pulmonary consolidation. There is no pleural effusion or pneumothorax. IMPRESSION: There are no signs of pulmonary edema or focal pulmonary consolidation. Moderate sized fixed hiatal hernia. Electronically Signed   By: Ernie Avena M.D.   On: 12/04/2022 11:56    EKG: I independently viewed the EKG done and my findings are as followed: EKG showed normal sinus rhythm of 64 bpm with ST and T wave abnormality  Assessment/Plan Present on Admission:  COPD with acute exacerbation (HCC)  Hypokalemia  Essential hypertension  Iron deficiency anemia due to chronic blood loss  Principal Problem:   COPD with acute exacerbation (HCC) Active Problems:   Essential hypertension   Hypokalemia   Iron deficiency anemia due to chronic blood loss   Near syncope   Mixed hyperlipidemia   CAD (coronary artery disease)   Low mean corpuscular volume (MCV)   Constipation   Hiatal hernia  COPD with acute exacerbation Continue duo nebs, Robitussin, Mucinex, Solu-Medrol, azithromycin. Continue Protonix to prevent steroid-induced ulcer Continue incentive spirometry and flutter valve  Near syncope Patient complained of near syncopal episodes associated with wheezing, diaphoresis on ambulation. Continue telemetry and watch for arrhythmias Troponins  5 > 4; she denies chest pain EKG showed normal sinus rhythm of 64 bpm with ST and T wave abnormality Echocardiogram done in October 2015 showed LVEF of 60 to 65%.  No RWMA Echocardiogram will be done to rule out significant aortic stenosis or other outflow obstruction, and also to evaluate EF and to rule out segmental/Regional wall motion abnormalities.  Carotid artery Dopplers will be done to  rule out hemodynamically significant stenosis  Hypokalemia K+ 3.3, this will be replenished  Acute kidney injury Creatinine 1.46 (baseline creatinine at 0.9) Continue gentle hydration Renally adjust medications, avoid nephrotoxic agents/dehydration/hypotension  Low MCV Iron deficiency anemia MCV 75.3, iron studies will be done Continue ferrous sulfate  Constipation CT abdomen and pelvis showed no obstruction or stool burden Continue MiraLAX  GERD/hiatal hernia Continue Protonix  Essential hypertension Continue amlodipine, metoprolol  CAD s/p CABG Continue aspirin, statin, Toprol XL  Mixed hyperlipidemia Continue Lipitor  DVT prophylaxis: Lovenox  Advance Care Planning: Full code  Consults: None  Family Communication: None at bedside  Severity of Illness:  The appropriate patient status for this patient is OBSERVATION. Observation status is judged to be reasonable and necessary in order to provide the required intensity of service to ensure the patient's safety. The patient's presenting symptoms, physical exam findings, and initial radiographic and laboratory data in the context of their medical condition is felt to place them at decreased risk for further clinical deterioration. Furthermore, it is anticipated that the patient will be medically stable for discharge from the hospital within 2 midnights of admission.   Author: Frankey Shown, DO 12/04/2022 8:47 PM  For on call review [email or web address].

## 2022-12-04 NOTE — ED Provider Notes (Signed)
Patient still with some wheezing.  Things have improved.  But not clear.  Will contact hospitalist for admission.  Patient had CT scan abdomen pelvis and chest because she was having night sweats and they were concerned that maybe there was a hidden malignancy.  But that was all negative.   Vanetta Mulders, MD 12/04/22 [phone]

## 2022-12-04 NOTE — ED Notes (Signed)
Charge advised pt may be taken up at this time to 2A [facility], [facility] to transport pt to unit

## 2022-12-05 ENCOUNTER — Observation Stay (HOSPITAL_BASED_OUTPATIENT_CLINIC_OR_DEPARTMENT_OTHER): Payer: Medicare HMO

## 2022-12-05 ENCOUNTER — Other Ambulatory Visit (HOSPITAL_COMMUNITY): Payer: Self-pay | Admitting: *Deleted

## 2022-12-05 ENCOUNTER — Observation Stay (HOSPITAL_COMMUNITY): Payer: Medicare HMO

## 2022-12-05 DIAGNOSIS — R55 Syncope and collapse: Secondary | ICD-10-CM

## 2022-12-05 DIAGNOSIS — J441 Chronic obstructive pulmonary disease with (acute) exacerbation: Secondary | ICD-10-CM | POA: Diagnosis not present

## 2022-12-05 LAB — ECHOCARDIOGRAM COMPLETE
AR max vel: 1.69 cm2
AV Area VTI: 1.43 cm2
AV Area mean vel: 1.59 cm2
AV Mean grad: 6 mmHg
AV Peak grad: 11.4 mmHg
Ao pk vel: 1.69 m/s
Area-P 1/2: 3.37 cm2
Height: 64 in
S' Lateral: 1.9 cm
Weight: 2797.2 oz

## 2022-12-05 LAB — COMPREHENSIVE METABOLIC PANEL
ALT: 15 U/L (ref 0–44)
AST: 19 U/L (ref 15–41)
Albumin: 3.4 g/dL — ABNORMAL LOW (ref 3.5–5.0)
Alkaline Phosphatase: 58 U/L (ref 38–126)
Anion gap: 10 (ref 5–15)
BUN: 24 mg/dL — ABNORMAL HIGH (ref 8–23)
CO2: 23 mmol/L (ref 22–32)
Calcium: 9.1 mg/dL (ref 8.9–10.3)
Chloride: 106 mmol/L (ref 98–111)
Creatinine, Ser: 1.16 mg/dL — ABNORMAL HIGH (ref 0.44–1.00)
GFR, Estimated: 48 mL/min — ABNORMAL LOW (ref >60)
Glucose, Bld: 173 mg/dL — ABNORMAL HIGH (ref 70–99)
Potassium: 4 mmol/L (ref 3.5–5.1)
Sodium: 139 mmol/L (ref 135–145)
Total Bilirubin: 0.8 mg/dL (ref 0.3–1.2)
Total Protein: 6.5 g/dL (ref 6.5–8.1)

## 2022-12-05 LAB — PHOSPHORUS: Phosphorus: 3.2 mg/dL (ref 2.5–4.6)

## 2022-12-05 LAB — CBC
HCT: 43.3 % (ref 36.0–46.0)
Hemoglobin: 13.2 g/dL (ref 12.0–15.0)
MCH: 23 pg — ABNORMAL LOW (ref 26.0–34.0)
MCHC: 30.5 g/dL (ref 30.0–36.0)
MCV: 75.4 fL — ABNORMAL LOW (ref 80.0–100.0)
Platelets: 318 10*3/uL (ref 150–400)
RBC: 5.74 MIL/uL — ABNORMAL HIGH (ref 3.87–5.11)
RDW: 18.1 % — ABNORMAL HIGH (ref 11.5–15.5)
WBC: 8.5 10*3/uL (ref 4.0–10.5)
nRBC: 0 % (ref 0.0–0.2)

## 2022-12-05 LAB — IRON AND TIBC
Iron: 28 ug/dL (ref 28–170)
Saturation Ratios: 8 % — ABNORMAL LOW (ref 10.4–31.8)
TIBC: 353 ug/dL (ref 250–450)
UIBC: 325 ug/dL

## 2022-12-05 LAB — FERRITIN: Ferritin: 9 ng/mL — ABNORMAL LOW (ref 11–307)

## 2022-12-05 LAB — MAGNESIUM: Magnesium: 2.1 mg/dL (ref 1.7–2.4)

## 2022-12-05 MED ORDER — GUAIFENESIN-DM 100-10 MG/5ML PO SYRP: 5.0000 mL | ORAL_SOLUTION | ORAL | 0 refills | Status: AC | PRN

## 2022-12-05 MED ORDER — AZITHROMYCIN 250 MG PO TABS: ORAL_TABLET | ORAL | 0 refills | Status: AC

## 2022-12-05 MED ORDER — ORAL CARE MOUTH RINSE: 15.0000 mL | OROMUCOSAL | Status: DC | PRN

## 2022-12-05 MED ORDER — FLEET ENEMA 7-19 GM/118ML RE ENEM: 1.0000 | ENEMA | Freq: Once | RECTAL | Status: DC

## 2022-12-05 MED ORDER — PREDNISONE 10 MG PO TABS: ORAL_TABLET | ORAL | 0 refills | Status: AC

## 2022-12-05 MED ORDER — IBUPROFEN 800 MG PO TABS: 800.0000 mg | ORAL_TABLET | Freq: Three times a day (TID) | ORAL | 0 refills | Status: AC | PRN

## 2022-12-05 MED ORDER — BISACODYL 10 MG RE SUPP
10.0000 mg | Freq: Once | RECTAL | Status: DC
  Filled 2022-12-05: qty 1

## 2022-12-05 NOTE — Progress Notes (Signed)
*  PRELIMINARY RESULTS* Echocardiogram 2D Echocardiogram has been performed.  [name] 12/05/2022, 12:56 PM 

## 2022-12-05 NOTE — Discharge Summary (Signed)
Physician Discharge Summary  [name] XBJ:[id number] DOB: July 04, 1945 DOA: 12/04/2022  PCP: Benetta Spar, MD  Admit date: 12/04/2022 Discharge date: 12/05/2022  Admitted From: Home Disposition: Home  Recommendations for Outpatient Follow-up:  Follow up with PCP in 1-2 weeks Please complete all antibiotics and steroids as discussed  Home Health: None Equipment/Devices: None  Discharge Condition: Stable CODE STATUS: Full Diet recommendation: Low-salt low-fat low-carb diet  Brief/Interim Summary: [name] is a 77 y.o. female with medical history significant of iron deficiency anemia, COPD, hypertension, hyperlipidemia, GERD, CAD s/p CABG who presents to the emergency department after being sent to the ED from her PCP due to abnormal pulmonary exam for further evaluation and management.   Patient admitted as above per PCP after a "abnormal pulmonary exam" with questionable hypoxia and dyspnea with exertion.  At intake patient complained of respiratory symptoms including cough wheezing and fatigue, imaging generally unremarkable for any acute findings, presumed to have COPD exacerbation without hypoxia.  On steroids nebs and supportive care patient symptoms resolved quite rapidly.  She is now essentially back to baseline ambulating without hypoxia dyspnea or other symptoms.  Echocardiogram performed given abnormal symptoms of dyspnea and questionable diaphoresis, EF of 70 to 75% with grade 2 diastolic dysfunction -only mildly changed from echo on record in 2015.  Bilateral carotid ultrasound shows mild plaque of the carotid bulb with less than 50% stenosis bilaterally. During hospitalization patient indicated she had "not had a bowel movement since June 27" which was more than 2 weeks ago.  She is tolerating p.o. quite well but requesting laxatives and enemas which were provided with profound results.  She has no otherwise stable and agreeable for discharge home, close follow-up with  PCP as outlined above.  Discharge Diagnoses:  Principal Problem:   COPD with acute exacerbation (HCC) Active Problems:   Essential hypertension   Hypokalemia   Iron deficiency anemia due to chronic blood loss   Near syncope   Mixed hyperlipidemia   CAD (coronary artery disease)   Low mean corpuscular volume (MCV)   Constipation   Hiatal hernia    Discharge Instructions   Allergies as of 12/05/2022       Reactions   Lisinopril Swelling   Angioedema, pharyx after endoscopy   Omnipaque [iohexol] Other (See Comments)   blisters   Penicillins Other (See Comments)   Unknown/ Reaction 25 years        Medication List     STOP taking these medications    lidocaine 5 % Commonly known as: Lidoderm   oxyCODONE 5 MG immediate release tablet Commonly known as: Roxicodone       TAKE these medications    acetaminophen 650 MG CR tablet Commonly known as: TYLENOL Take 650 mg by mouth every 8 (eight) hours as needed for pain.   amLODipine 5 MG tablet Commonly known as: NORVASC Take 5 mg by mouth daily.   aspirin EC 81 MG tablet Take 81 mg by mouth daily. Swallow whole.   atorvastatin 40 MG tablet Commonly known as: LIPITOR Take 40 mg by mouth at bedtime.   azithromycin 250 MG tablet Commonly known as: ZITHROMAX Take 1 tablet daily until completed Start taking on: December 06, 2022   cholecalciferol 1000 units tablet Commonly known as: VITAMIN D Take 1,000 Units by mouth daily.   guaiFENesin-dextromethorphan 100-10 MG/5ML syrup Commonly known as: ROBITUSSIN DM Take 5 mLs by mouth every 4 (four) hours as needed for cough.   ibuprofen 800 MG  tablet Commonly known as: ADVIL Take 1 tablet (800 mg total) by mouth every 8 (eight) hours as needed for mild pain, headache or fever. What changed:  when to take this reasons to take this   metoprolol succinate 25 MG 24 hr tablet Commonly known as: TOPROL-XL Take 25 mg by mouth 2 (two) times daily.   pantoprazole 40  MG tablet Commonly known as: PROTONIX Take 1 tablet (40 mg total) by mouth 2 (two) times daily. What changed: when to take this   predniSONE 10 MG tablet Commonly known as: DELTASONE Take 4 tablets (40 mg total) by mouth daily for 3 days, THEN 3 tablets (30 mg total) daily for 3 days, THEN 2 tablets (20 mg total) daily for 3 days, THEN 1 tablet (10 mg total) daily for 3 days. Start taking on: December 05, 2022        Allergies  Allergen Reactions   Lisinopril Swelling    Angioedema, pharyx after endoscopy   Omnipaque [Iohexol] Other (See Comments)    blisters   Penicillins Other (See Comments)    Unknown/ Reaction 25 years    Consultations: None  Procedures/Studies: ECHOCARDIOGRAM COMPLETE  Result Date: 12/05/2022    ECHOCARDIOGRAM REPORT   Patient Name:   [name] Stay Date of Exam: 12/05/2022 Medical Rec #:  [id number]   Height:       64.0 in Accession #:    [id number]  Weight:       174.8 lb Date of Birth:  May 15, 1946    BSA:          1.848 m Patient Age:    77 years    BP:           148/76 mmHg Patient Gender: F           HR:           70 bpm. [facility] Procedure: 2D Echo, Cardiac Doppler and Color Doppler Indications:    Syncope R55  History:        Patient has prior history of Echocardiogram examinations, most                 recent 03/15/2014. CAD, COPD; Risk Factors:Hypertension,                 Dyslipidemia and Former Smoker.  Sonographer:    Celesta Gentile RCS Referring Phys: [id number] OLADAPO ADEFESO IMPRESSIONS  1. Left ventricular ejection fraction, by estimation, is 70 to 75%. The left ventricle has hyperdynamic function. The left ventricle has no regional wall motion abnormalities. There is mild concentric left ventricular hypertrophy. Left ventricular diastolic parameters are consistent with Grade II diastolic dysfunction (pseudonormalization).  2. Right ventricular systolic function is normal. The right ventricular size is normal. There is normal pulmonary artery  systolic pressure. The estimated right ventricular systolic pressure is 22.0 mmHg.  3. A small pericardial effusion is present. The pericardial effusion is posterior to the left ventricle.  4. The mitral valve is grossly normal. Trivial mitral valve regurgitation.  5. The aortic valve is tricuspid. Aortic valve regurgitation is not visualized. Aortic valve sclerosis/calcification is present, without any evidence of aortic stenosis. Aortic valve mean gradient measures 6.0 mmHg.  6. The inferior vena cava is normal in size with greater than 50% respiratory variability, suggesting right atrial pressure of 3 mmHg. Comparison(s): No prior Echocardiogram. FINDINGS  Left Ventricle: Left ventricular ejection fraction, by estimation, is 70 to 75%. The left ventricle has hyperdynamic function. The  left ventricle has no regional wall motion abnormalities. The left ventricular internal cavity size was normal in size. There is mild concentric left ventricular hypertrophy. Left ventricular diastolic parameters are consistent with Grade II diastolic dysfunction (pseudonormalization). Right Ventricle: The right ventricular size is normal. No increase in right ventricular wall thickness. Right ventricular systolic function is normal. There is normal pulmonary artery systolic pressure. The tricuspid regurgitant velocity is 2.18 m/s, and  with an assumed right atrial pressure of 3 mmHg, the estimated right ventricular systolic pressure is 22.0 mmHg. Left Atrium: Left atrial size was normal in size. Right Atrium: Right atrial size was normal in size. Pericardium: A small pericardial effusion is present. The pericardial effusion is posterior to the left ventricle. Mitral Valve: The mitral valve is grossly normal. Trivial mitral valve regurgitation. Tricuspid Valve: The tricuspid valve is grossly normal. Tricuspid valve regurgitation is mild. Aortic Valve: The aortic valve is tricuspid. There is mild aortic valve annular calcification.  Aortic valve regurgitation is not visualized. Aortic valve sclerosis/calcification is present, without any evidence of aortic stenosis. Aortic valve mean gradient measures 6.0 mmHg. Aortic valve peak gradient measures 11.4 mmHg. Aortic valve area, by VTI measures 1.43 cm. Pulmonic Valve: The pulmonic valve was grossly normal. Pulmonic valve regurgitation is not visualized. Aorta: The aortic root is normal in size and structure. Venous: The inferior vena cava is normal in size with greater than 50% respiratory variability, suggesting right atrial pressure of 3 mmHg. IAS/Shunts: No atrial level shunt detected by color flow Doppler.  LEFT VENTRICLE PLAX 2D LVIDd:         4.10 cm   Diastology LVIDs:         1.90 cm   LV e' medial:    4.57 cm/s LV PW:         1.20 cm   LV E/e' medial:  19.6 LV IVS:        1.30 cm   LV e' lateral:   7.72 cm/s LVOT diam:     1.70 cm   LV E/e' lateral: 11.6 LV SV:         49 LV SV Index:   27 LVOT Area:     2.27 cm  RIGHT VENTRICLE RV S prime:     12.10 cm/s TAPSE (M-mode): 1.8 cm LEFT ATRIUM           Index        RIGHT ATRIUM           Index LA diam:      2.30 cm 1.24 cm/m   RA Area:     14.40 cm LA Vol (A4C): 47.3 ml 25.60 ml/m  RA Volume:   33.80 ml  18.29 ml/m  AORTIC VALVE AV Area (Vmax):    1.69 cm AV Area (Vmean):   1.59 cm AV Area (VTI):     1.43 cm AV Vmax:           169.00 cm/s AV Vmean:          109.000 cm/s AV VTI:            0.342 m AV Peak Grad:      11.4 mmHg AV Mean Grad:      6.0 mmHg LVOT Vmax:         126.00 cm/s LVOT Vmean:        76.400 cm/s LVOT VTI:          0.216 m LVOT/AV VTI ratio: 0.63  AORTA Ao Root diam:  3.00 cm MITRAL VALVE               TRICUSPID VALVE MV Area (PHT): 3.37 cm    TR Peak grad:   19.0 mmHg MV Decel Time: 225 msec    TR Vmax:        218.00 cm/s MV E velocity: 89.50 cm/s MV A velocity: 85.70 cm/s  SHUNTS MV E/A ratio:  1.04        Systemic VTI:  0.22 m                            Systemic Diam: 1.70 cm Nona Dell MD Electronically  signed by Nona Dell MD Signature Date/Time: 12/05/2022/1:30:02 PM    Final    US Carotid Bilateral  Result Date: 12/05/2022 CLINICAL DATA:  Hypertension Syncope Hyperlipidemia Former tobacco use EXAM: BILATERAL CAROTID DUPLEX ULTRASOUND TECHNIQUE: Wallace Cullens scale imaging, color Doppler and duplex ultrasound were performed of bilateral carotid and vertebral arteries in the neck. COMPARISON:  None available FINDINGS: Criteria: Quantification of carotid stenosis is based on velocity parameters that correlate the residual internal carotid diameter with NASCET-based stenosis levels, using the diameter of the distal internal carotid lumen as the denominator for stenosis measurement. The following velocity measurements were obtained: RIGHT ICA: 84/25 cm/sec CCA: 94/14 cm/sec SYSTOLIC ICA/CCA RATIO:  0.9 ECA: 115 cm/sec LEFT ICA: 51/10 cm/sec CCA: 87/12 cm/sec SYSTOLIC ICA/CCA RATIO:  0.6 ECA: 121 cm/sec RIGHT CAROTID ARTERY: Minimal atheromatous plaque of the mid right common carotid artery. Mild calcified plaque of the right carotid bulb and internal carotid artery origin. RIGHT VERTEBRAL ARTERY:  Antegrade flow. LEFT CAROTID ARTERY: Mild calcified plaque of the carotid bulb extending into the internal carotid artery. LEFT VERTEBRAL ARTERY:  Antegrade flow. IMPRESSION: Mild atheromatous plaque of the carotid bulbs extending into the internal carotid artery origins with Doppler measurements consistent with less than 50% stenosis. Electronically Signed   By: Acquanetta Belling M.D.   On: 12/05/2022 09:34   CT CHEST ABDOMEN PELVIS WO CONTRAST  Result Date: 12/04/2022 CLINICAL DATA:  Unintended weight loss, cough, constipation EXAM: CT CHEST, ABDOMEN AND PELVIS WITHOUT CONTRAST TECHNIQUE: Multidetector CT imaging of the chest, abdomen and pelvis was performed following the standard protocol without IV contrast. RADIATION DOSE REDUCTION: This exam was performed according to the departmental dose-optimization program which  includes automated exposure control, adjustment of the mA and/or kV according to patient size and/or use of iterative reconstruction technique. COMPARISON:  CT abdomen and pelvis done on 03/14/2014 FINDINGS: CT CHEST FINDINGS Cardiovascular: Coronary artery calcifications are seen. There is previous coronary bypass surgery. Calcifications are seen in thoracic aorta and its major branches. Mediastinum/Nodes: No significant lymphadenopathy is seen. Lungs/Pleura: There is no focal pulmonary consolidation. No discrete lung nodules are seen. There is increase in interstitial markings in the periphery of both lungs suggesting scarring. There is no pleural effusion or pneumothorax. Musculoskeletal: Degenerative changes are noted in thoracic spine with disc space narrowing and bony spurs at multiple levels. CT ABDOMEN PELVIS FINDINGS Hepatobiliary: No focal abnormalities are seen in liver. There is no dilation of bile ducts. Large gallbladder stone is seen. There is no wall thickening in gallbladder. Pancreas: No focal abnormalities are seen. Spleen: Unremarkable. Adrenals/Urinary Tract: Adrenals are unremarkable. There is no hydronephrosis. There is 1 mm punctate calculus in the midportion of right kidney. Ureters are unremarkable. Urinary bladder is unremarkable. Stomach/Bowel: Moderate sized hiatal hernia is seen. Stomach is not distended. Small bowel  loops are not dilated. Appendix is difficult to visualize. Days gas-filled tubular structure adjacent to the cecum, possibly normal appendix. There is no significant wall thickening in colon. There is no pericolic stranding. Multiple diverticula seen in colon, especially in sigmoid without signs of focal acute diverticulitis. There is no pericolic stranding or fluid collection. Vascular/Lymphatic: Scattered arterial calcifications are seen. No significant lymphadenopathy is seen. Reproductive: There are coarse calcifications in uterus suggesting fibroids. Other: There is no  ascites or pneumoperitoneum. Small umbilical hernia containing fat is seen. Musculoskeletal: Degenerative changes are noted with encroachment of the neural foramina by bony spurs and bulging of the annulus at multiple levels in lumbar spine. IMPRESSION: No acute findings are seen in noncontrast CT chest, abdomen and pelvis. There is no significant lymphadenopathy. There are no infiltrates or discrete nodules in the lung fields.There is no evidence of intestinal obstruction or pneumoperitoneum. There is no hydronephrosis. Coronary artery disease. Aortic arteriosclerosis. Gallbladder stone. Moderate sized fixed hiatal hernia. There is a punctate 1 mm calcification in the midportion of right kidney which may be vascular or suggest nonobstructing calculus. Diverticulosis of colon without signs of focal diverticulitis. Electronically Signed   By: Ernie Avena M.D.   On: 12/04/2022 16:34   DG Chest 2 View  Result Date: 12/04/2022 CLINICAL DATA:  Cough EXAM: CHEST - 2 VIEW COMPARISON:  Previous studies including the examination of 02/21/2022 FINDINGS: Cardiac size is within normal limits. There is previous coronary bypass surgery. There is lucency in the retrocardiac region suggesting moderate sized hiatal hernia. There are no signs of pulmonary edema or focal pulmonary consolidation. There is no pleural effusion or pneumothorax. IMPRESSION: There are no signs of pulmonary edema or focal pulmonary consolidation. Moderate sized fixed hiatal hernia. Electronically Signed   By: Ernie Avena M.D.   On: 12/04/2022 11:56     Subjective: No acute issues or events overnight, complaining of constipation but improved with supportive care   Discharge Exam: Vitals:   12/05/22 0811 12/05/22 1230  BP: (!) 148/75   Pulse: 70   Resp:    Temp: 98.2 F (36.8 C)   SpO2: 99% 99%   Vitals:   12/05/22 0342 12/05/22 0731 12/05/22 0811 12/05/22 1230  BP: 124/69  (!) 148/75   Pulse: 62  70   Resp: 20      Temp: 98 F (36.7 C)  98.2 F (36.8 C)   TempSrc: Oral  Oral   SpO2: 94% 94% 99% 99%  Weight:      Height:        General: Pt is alert, awake, not in acute distress Cardiovascular: RRR, S1/S2 +, no rubs, no gallops Respiratory: CTA bilaterally, no wheezing, no rhonchi Abdominal: Soft, NT, ND, bowel sounds + Extremities: no edema, no cyanosis    The results of significant diagnostics from this hospitalization (including imaging, microbiology, ancillary and laboratory) are listed below for reference.     Microbiology: Recent Results (from the past 240 hour(s))  Resp panel by RT-PCR (RSV, Flu A&B, Covid) Anterior Nasal Swab     Status: None   Collection Time: 12/04/22 11:13 AM   Specimen: Anterior Nasal Swab  Result Value Ref Range Status   SARS Coronavirus 2 by RT PCR NEGATIVE NEGATIVE Final    Comment: (NOTE) SARS-CoV-2 target nucleic acids are NOT DETECTED.  The SARS-CoV-2 RNA is generally detectable in upper respiratory specimens during the acute phase of infection. The lowest concentration of SARS-CoV-2 viral copies this assay can detect is 138 copies/mL.  A negative result does not preclude SARS-Cov-2 infection and should not be used as the sole basis for treatment or other patient management decisions. A negative result may occur with  improper specimen collection/handling, submission of specimen other than nasopharyngeal swab, presence of viral mutation(s) within the areas targeted by this assay, and inadequate number of viral copies(<138 copies/mL). A negative result must be combined with clinical observations, patient history, and epidemiological information. The expected result is Negative.  Fact Sheet for Patients:  BloggerCourse.com  Fact Sheet for Healthcare Providers:  SeriousBroker.it  This test is no t yet approved or cleared by the [place] FDA and  has been authorized for detection and/or  diagnosis of SARS-CoV-2 by FDA under an Emergency Use Authorization (EUA). This EUA will remain  in effect (meaning this test can be used) for the duration of the COVID-19 declaration under Section 564(b)(1) of the Act, 21 U.S.C.section 360bbb-3(b)(1), unless the authorization is terminated  or revoked sooner.       Influenza A by PCR NEGATIVE NEGATIVE Final   Influenza B by PCR NEGATIVE NEGATIVE Final    Comment: (NOTE) The Xpert Xpress SARS-CoV-2/FLU/RSV plus assay is intended as an aid in the diagnosis of influenza from Nasopharyngeal swab specimens and should not be used as a sole basis for treatment. Nasal washings and aspirates are unacceptable for Xpert Xpress SARS-CoV-2/FLU/RSV testing.  Fact Sheet for Patients: BloggerCourse.com  Fact Sheet for Healthcare Providers: SeriousBroker.it  This test is not yet approved or cleared by the [place] FDA and has been authorized for detection and/or diagnosis of SARS-CoV-2 by FDA under an Emergency Use Authorization (EUA). This EUA will remain in effect (meaning this test can be used) for the duration of the COVID-19 declaration under Section 564(b)(1) of the Act, 21 U.S.C. section 360bbb-3(b)(1), unless the authorization is terminated or revoked.     Resp Syncytial Virus by PCR NEGATIVE NEGATIVE Final    Comment: (NOTE) Fact Sheet for Patients: BloggerCourse.com  Fact Sheet for Healthcare Providers: SeriousBroker.it  This test is not yet approved or cleared by the [place] FDA and has been authorized for detection and/or diagnosis of SARS-CoV-2 by FDA under an Emergency Use Authorization (EUA). This EUA will remain in effect (meaning this test can be used) for the duration of the COVID-19 declaration under Section 564(b)(1) of the Act, 21 U.S.C. section 360bbb-3(b)(1), unless the authorization is terminated  or revoked.  Performed at [facility], [address] [place], [place] [address]   Blood culture (routine x 2)     Status: None (Preliminary result)   Collection Time: 12/04/22 12:27 PM   Specimen: BLOOD  Result Value Ref Range Status   Specimen Description BLOOD BLOOD LEFT FOREARM  Final   Special Requests   Final    BOTTLES DRAWN AEROBIC AND ANAEROBIC Blood Culture results may not be optimal due to an inadequate volume of blood received in culture bottles   Culture   Final    NO GROWTH < 24 HOURS Performed at [facility], [address] [place], [place] [address]    Report Status PENDING  Incomplete  Blood culture (routine x 2)     Status: None (Preliminary result)   Collection Time: 12/04/22 12:27 PM   Specimen: BLOOD  Result Value Ref Range Status   Specimen Description BLOOD LEFT ANTECUBITAL  Final   Special Requests   Final    BOTTLES DRAWN AEROBIC AND ANAEROBIC Blood Culture adequate volume   Culture   Final  NO GROWTH < 24 HOURS Performed at [facility] [address] [place], [place] [address]    Report Status PENDING  Incomplete     Labs: BNP (last 3 results) No results for input(s): "BNP" in the last 8760 hours. Basic Metabolic Panel: Recent Labs  Lab 12/04/22 1227 12/05/22 0337  NA 138 139  K 3.3* 4.0  CL 104 106  CO2 22 23  GLUCOSE 103* 173*  BUN 17 24*  CREATININE 1.46* 1.16*  CALCIUM 9.1 9.1  MG  --  2.1  PHOS  --  3.2   Liver Function Tests: Recent Labs  Lab 12/04/22 1227 12/05/22 0337  AST 21 19  ALT 18 15  ALKPHOS 67 58  BILITOT 0.9 0.8  PROT 7.2 6.5  ALBUMIN 4.1 3.4*   No results for input(s): "LIPASE", "AMYLASE" in the last 168 hours. No results for input(s): "AMMONIA" in the last 168 hours. CBC: Recent Labs  Lab 12/04/22 1227 12/05/22 0337  WBC 8.9 8.5  NEUTROABS 5.6  --   HGB 14.9 13.2  HCT 48.6* 43.3  MCV 75.3* 75.4*  PLT 368 318   Cardiac Enzymes: No results for input(s): "CKTOTAL", "CKMB", "CKMBINDEX",  "TROPONINI" in the last 168 hours. BNP: Invalid input(s): "POCBNP" CBG: No results for input(s): "GLUCAP" in the last 168 hours. D-Dimer No results for input(s): "DDIMER" in the last 72 hours. Hgb A1c No results for input(s): "HGBA1C" in the last 72 hours. Lipid Profile No results for input(s): "CHOL", "HDL", "LDLCALC", "TRIG", "CHOLHDL", "LDLDIRECT" in the last 72 hours. Thyroid function studies No results for input(s): "TSH", "T4TOTAL", "T3FREE", "THYROIDAB" in the last 72 hours.  Invalid input(s): "FREET3" Anemia work up Recent Labs    12/05/22 0337  FERRITIN 9*  TIBC 353  IRON 28   Urinalysis    Component Value Date/Time   COLORURINE YELLOW 06/07/2016 1644   APPEARANCEUR HAZY (A) 06/07/2016 1644   LABSPEC >1.030 (H) 06/07/2016 1644   PHURINE 5.5 06/07/2016 1644   GLUCOSEU 100 (A) 06/07/2016 1644   HGBUR NEGATIVE 06/07/2016 1644   BILIRUBINUR SMALL (A) 06/07/2016 1644   KETONESUR TRACE (A) 06/07/2016 1644   PROTEINUR NEGATIVE 06/07/2016 1644   UROBILINOGEN 0.2 03/14/2014 1843   NITRITE NEGATIVE 06/07/2016 1644   LEUKOCYTESUR NEGATIVE 06/07/2016 1644   Sepsis Labs Recent Labs  Lab 12/04/22 1227 12/05/22 0337  WBC 8.9 8.5   Microbiology Recent Results (from the past 240 hour(s))  Resp panel by RT-PCR (RSV, Flu A&B, Covid) Anterior Nasal Swab     Status: None   Collection Time: 12/04/22 11:13 AM   Specimen: Anterior Nasal Swab  Result Value Ref Range Status   SARS Coronavirus 2 by RT PCR NEGATIVE NEGATIVE Final    Comment: (NOTE) SARS-CoV-2 target nucleic acids are NOT DETECTED.  The SARS-CoV-2 RNA is generally detectable in upper respiratory specimens during the acute phase of infection. The lowest concentration of SARS-CoV-2 viral copies this assay can detect is 138 copies/mL. A negative result does not preclude SARS-Cov-2 infection and should not be used as the sole basis for treatment or other patient management decisions. A negative result may occur  with  improper specimen collection/handling, submission of specimen other than nasopharyngeal swab, presence of viral mutation(s) within the areas targeted by this assay, and inadequate number of viral copies(<138 copies/mL). A negative result must be combined with clinical observations, patient history, and epidemiological information. The expected result is Negative.  Fact Sheet for Patients:  BloggerCourse.com  Fact Sheet for Healthcare Providers:  SeriousBroker.it  This test is no t yet approved or cleared by the [place] and  has been authorized for detection and/or diagnosis of SARS-CoV-2 by FDA under an Emergency Use Authorization (EUA). This EUA will remain  in effect (meaning this test can be used) for the duration of the COVID-19 declaration under Section 564(b)(1) of the Act, 21 U.S.C.section 360bbb-3(b)(1), unless the authorization is terminated  or revoked sooner.       Influenza A by PCR NEGATIVE NEGATIVE Final   Influenza B by PCR NEGATIVE NEGATIVE Final    Comment: (NOTE) The Xpert Xpress SARS-CoV-2/FLU/RSV plus assay is intended as an aid in the diagnosis of influenza from Nasopharyngeal swab specimens and should not be used as a sole basis for treatment. Nasal washings and aspirates are unacceptable for Xpert Xpress SARS-CoV-2/FLU/RSV testing.  Fact Sheet for Patients: BloggerCourse.com  Fact Sheet for Healthcare Providers: SeriousBroker.it  This test is not yet approved or cleared by the [place] FDA and has been authorized for detection and/or diagnosis of SARS-CoV-2 by FDA under an Emergency Use Authorization (EUA). This EUA will remain in effect (meaning this test can be used) for the duration of the COVID-19 declaration under Section 564(b)(1) of the Act, 21 U.S.C. section 360bbb-3(b)(1), unless the authorization is terminated  or revoked.     Resp Syncytial Virus by PCR NEGATIVE NEGATIVE Final    Comment: (NOTE) Fact Sheet for Patients: BloggerCourse.com  Fact Sheet for Healthcare Providers: SeriousBroker.it  This test is not yet approved or cleared by the [place] FDA and has been authorized for detection and/or diagnosis of SARS-CoV-2 by FDA under an Emergency Use Authorization (EUA). This EUA will remain in effect (meaning this test can be used) for the duration of the COVID-19 declaration under Section 564(b)(1) of the Act, 21 U.S.C. section 360bbb-3(b)(1), unless the authorization is terminated or revoked.  Performed at [facility], [address] [place], [place] [address]   Blood culture (routine x 2)     Status: None (Preliminary result)   Collection Time: 12/04/22 12:27 PM   Specimen: BLOOD  Result Value Ref Range Status   Specimen Description BLOOD BLOOD LEFT FOREARM  Final   Special Requests   Final    BOTTLES DRAWN AEROBIC AND ANAEROBIC Blood Culture results may not be optimal due to an inadequate volume of blood received in culture bottles   Culture   Final    NO GROWTH < 24 HOURS Performed at [facility], [address] [place], [place] [address]    Report Status PENDING  Incomplete  Blood culture (routine x 2)     Status: None (Preliminary result)   Collection Time: 12/04/22 12:27 PM   Specimen: BLOOD  Result Value Ref Range Status   Specimen Description BLOOD LEFT ANTECUBITAL  Final   Special Requests   Final    BOTTLES DRAWN AEROBIC AND ANAEROBIC Blood Culture adequate volume   Culture   Final    NO GROWTH < 24 HOURS Performed at [facility], [address], [place], [place] [address]    Report Status PENDING  Incomplete     Time coordinating discharge: Over 30 minutes  SIGNED:   Azucena Fallen, DO [facility] 12/05/2022, 3:04 PM Pager   If 7PM-7AM, please contact night-coverage [email or web address]

## 2022-12-05 NOTE — TOC CM/SW Note (Signed)
Transition of Care [facility]) - Inpatient Brief Assessment   Patient Details  Name: [name] MRN: [id number] Date of Birth: 1946-03-07  Transition of [facility]) CM/SW Contact:    Villa Herb, LCSWA Phone Number: 12/05/2022, 12:40 PM   Clinical Narrative: Transition of Care Department [facility]) has reviewed patient and no TOC needs have been identified at this time. We will continue to monitor patient advancement through interdisciplinary progression rounds. If new patient transition needs arise, please place a TOC consult.  Transition of Care Asessment: Insurance and Status: Insurance coverage has been reviewed Patient has primary care physician: Yes Home environment has been reviewed: from home Prior level of function:: independent Prior/Current Home Services: No current home services Social Determinants of Health Reivew: SDOH reviewed no interventions necessary Readmission risk has been reviewed: Yes Transition of care needs: no transition of care needs at this time

## 2022-12-05 NOTE — Plan of Care (Signed)
  Problem: Education: Goal: Knowledge of General Education information will improve Description Including pain rating scale, medication(s)/side effects and non-pharmacologic comfort measures Outcome: Progressing   

## 2022-12-09 LAB — CULTURE, BLOOD (ROUTINE X 2)
Culture: NO GROWTH
Culture: NO GROWTH
Special Requests: ADEQUATE

## 2023-04-08 ENCOUNTER — Other Ambulatory Visit (HOSPITAL_COMMUNITY): Payer: Self-pay | Admitting: Internal Medicine

## 2023-04-08 ENCOUNTER — Other Ambulatory Visit (HOSPITAL_COMMUNITY)
Admission: RE | Admit: 2023-04-08 | Discharge: 2023-04-08 | Disposition: A | Discharge status: Home / Self Care | Payer: Medicare HMO | Source: Ambulatory Visit | Attending: Internal Medicine | Admitting: Internal Medicine

## 2023-04-08 DIAGNOSIS — E785 Hyperlipidemia, unspecified: Secondary | ICD-10-CM | POA: Diagnosis present

## 2023-04-08 DIAGNOSIS — Z1231 Encounter for screening mammogram for malignant neoplasm of breast: Secondary | ICD-10-CM

## 2023-04-08 DIAGNOSIS — I10 Essential (primary) hypertension: Secondary | ICD-10-CM | POA: Insufficient documentation

## 2023-04-08 LAB — CBC WITH DIFFERENTIAL/PLATELET
Abs Immature Granulocytes: 0.01 10*3/uL (ref 0.00–0.07)
Basophils Absolute: 0.1 10*3/uL (ref 0.0–0.1)
Basophils Relative: 1 %
Eosinophils Absolute: 0.2 10*3/uL (ref 0.0–0.5)
Eosinophils Relative: 2 %
HCT: 43.3 % (ref 36.0–46.0)
Hemoglobin: 12.9 g/dL (ref 12.0–15.0)
Immature Granulocytes: 0 %
Lymphocytes Relative: 24 %
Lymphs Abs: 1.7 10*3/uL (ref 0.7–4.0)
MCH: 23.4 pg — ABNORMAL LOW (ref 26.0–34.0)
MCHC: 29.8 g/dL — ABNORMAL LOW (ref 30.0–36.0)
MCV: 78.6 fL — ABNORMAL LOW (ref 80.0–100.0)
Monocytes Absolute: 0.4 10*3/uL (ref 0.1–1.0)
Monocytes Relative: 7 %
Neutro Abs: 4.5 10*3/uL (ref 1.7–7.7)
Neutrophils Relative %: 66 %
Platelets: 295 10*3/uL (ref 150–400)
RBC: 5.51 MIL/uL — ABNORMAL HIGH (ref 3.87–5.11)
RDW: 17 % — ABNORMAL HIGH (ref 11.5–15.5)
WBC: 6.8 10*3/uL (ref 4.0–10.5)
nRBC: 0 % (ref 0.0–0.2)

## 2023-04-08 LAB — BASIC METABOLIC PANEL
Anion gap: 8 (ref 5–15)
BUN: 17 mg/dL (ref 8–23)
CO2: 27 mmol/L (ref 22–32)
Calcium: 9.1 mg/dL (ref 8.9–10.3)
Chloride: 104 mmol/L (ref 98–111)
Creatinine, Ser: 0.87 mg/dL (ref 0.44–1.00)
GFR, Estimated: >60 mL/min (ref >60)
Glucose, Bld: 96 mg/dL (ref 70–99)
Potassium: 4.1 mmol/L (ref 3.5–5.1)
Sodium: 139 mmol/L (ref 135–145)

## 2023-04-08 LAB — HEPATIC FUNCTION PANEL
ALT: 14 U/L (ref 0–44)
AST: 19 U/L (ref 15–41)
Albumin: 3.8 g/dL (ref 3.5–5.0)
Alkaline Phosphatase: 54 U/L (ref 38–126)
Bilirubin, Direct: 0.1 mg/dL (ref 0.0–0.2)
Indirect Bilirubin: 0.7 mg/dL (ref 0.3–0.9)
Total Bilirubin: 0.8 mg/dL (ref <1.2)
Total Protein: 6.7 g/dL (ref 6.5–8.1)

## 2023-04-08 LAB — LIPID PANEL
Cholesterol: 147 mg/dL (ref 0–200)
HDL: 50 mg/dL (ref >40)
LDL Cholesterol: 72 mg/dL (ref 0–99)
Total CHOL/HDL Ratio: 2.9 {ratio}
Triglycerides: 125 mg/dL (ref <150)
VLDL: 25 mg/dL (ref 0–40)

## 2023-04-21 ENCOUNTER — Ambulatory Visit (HOSPITAL_COMMUNITY): Payer: Medicare HMO

## 2023-04-28 ENCOUNTER — Ambulatory Visit (HOSPITAL_COMMUNITY): Payer: Medicare HMO

## 2023-04-30 ENCOUNTER — Ambulatory Visit (HOSPITAL_COMMUNITY)
Admission: RE | Admit: 2023-04-30 | Discharge: 2023-04-30 | Disposition: A | Discharge status: Home / Self Care | Payer: Medicare HMO | Source: Ambulatory Visit | Attending: Internal Medicine | Admitting: Internal Medicine

## 2023-04-30 DIAGNOSIS — Z1231 Encounter for screening mammogram for malignant neoplasm of breast: Secondary | ICD-10-CM | POA: Insufficient documentation

## 2023-08-13 DIAGNOSIS — I10 Essential (primary) hypertension: Secondary | ICD-10-CM | POA: Diagnosis not present

## 2023-08-13 DIAGNOSIS — E785 Hyperlipidemia, unspecified: Secondary | ICD-10-CM | POA: Diagnosis not present

## 2023-09-13 DIAGNOSIS — E785 Hyperlipidemia, unspecified: Secondary | ICD-10-CM | POA: Diagnosis not present

## 2023-09-13 DIAGNOSIS — I10 Essential (primary) hypertension: Secondary | ICD-10-CM | POA: Diagnosis not present

## 2023-10-13 DIAGNOSIS — K219 Gastro-esophageal reflux disease without esophagitis: Secondary | ICD-10-CM | POA: Diagnosis not present

## 2023-10-13 DIAGNOSIS — I10 Essential (primary) hypertension: Secondary | ICD-10-CM | POA: Diagnosis not present

## 2023-10-13 DIAGNOSIS — J42 Unspecified chronic bronchitis: Secondary | ICD-10-CM | POA: Diagnosis not present

## 2023-10-13 DIAGNOSIS — E785 Hyperlipidemia, unspecified: Secondary | ICD-10-CM | POA: Diagnosis not present

## 2023-11-13 DIAGNOSIS — I10 Essential (primary) hypertension: Secondary | ICD-10-CM | POA: Diagnosis not present

## 2023-11-13 DIAGNOSIS — E785 Hyperlipidemia, unspecified: Secondary | ICD-10-CM | POA: Diagnosis not present

## 2023-12-15 ENCOUNTER — Ambulatory Visit (HOSPITAL_COMMUNITY)
Admission: RE | Admit: 2023-12-15 | Discharge: 2023-12-15 | Disposition: A | Discharge status: Home / Self Care | Source: Ambulatory Visit | Attending: Gerontology | Admitting: Gerontology

## 2023-12-15 ENCOUNTER — Other Ambulatory Visit (HOSPITAL_COMMUNITY): Payer: Self-pay | Admitting: Gerontology

## 2023-12-15 ENCOUNTER — Other Ambulatory Visit (HOSPITAL_COMMUNITY)
Admission: RE | Admit: 2023-12-15 | Discharge: 2023-12-15 | Disposition: A | Discharge status: Home / Self Care | Source: Ambulatory Visit | Attending: Internal Medicine | Admitting: Internal Medicine

## 2023-12-15 DIAGNOSIS — K449 Diaphragmatic hernia without obstruction or gangrene: Secondary | ICD-10-CM | POA: Diagnosis not present

## 2023-12-15 DIAGNOSIS — I10 Essential (primary) hypertension: Secondary | ICD-10-CM | POA: Diagnosis not present

## 2023-12-15 DIAGNOSIS — R059 Cough, unspecified: Secondary | ICD-10-CM | POA: Diagnosis not present

## 2023-12-15 DIAGNOSIS — J209 Acute bronchitis, unspecified: Secondary | ICD-10-CM | POA: Diagnosis not present

## 2023-12-15 DIAGNOSIS — N390 Urinary tract infection, site not specified: Secondary | ICD-10-CM | POA: Diagnosis not present

## 2023-12-15 LAB — URINALYSIS, W/ REFLEX TO CULTURE (INFECTION SUSPECTED)
Bilirubin Urine: NEGATIVE
Glucose, UA: NEGATIVE mg/dL
Hgb urine dipstick: NEGATIVE
Ketones, ur: NEGATIVE mg/dL
Nitrite: NEGATIVE
Protein, ur: 30 mg/dL — AB
Specific Gravity, Urine: 1.025 (ref 1.005–1.030)
pH: 5 (ref 5.0–8.0)

## 2024-01-08 ENCOUNTER — Other Ambulatory Visit: Payer: Self-pay

## 2024-01-15 DIAGNOSIS — I10 Essential (primary) hypertension: Secondary | ICD-10-CM | POA: Diagnosis not present

## 2024-01-15 DIAGNOSIS — E785 Hyperlipidemia, unspecified: Secondary | ICD-10-CM | POA: Diagnosis not present

## 2024-02-15 DIAGNOSIS — E785 Hyperlipidemia, unspecified: Secondary | ICD-10-CM | POA: Diagnosis not present

## 2024-02-15 DIAGNOSIS — I10 Essential (primary) hypertension: Secondary | ICD-10-CM | POA: Diagnosis not present

## 2024-02-19 ENCOUNTER — Other Ambulatory Visit (HOSPITAL_COMMUNITY)
Admission: RE | Admit: 2024-02-19 | Discharge: 2024-02-19 | Disposition: A | Discharge status: Home / Self Care | Source: Ambulatory Visit | Attending: Internal Medicine | Admitting: Internal Medicine

## 2024-02-19 DIAGNOSIS — N390 Urinary tract infection, site not specified: Secondary | ICD-10-CM | POA: Diagnosis not present

## 2024-02-19 LAB — URINALYSIS, W/ REFLEX TO CULTURE (INFECTION SUSPECTED)
Bacteria, UA: NONE SEEN
Bilirubin Urine: NEGATIVE
Glucose, UA: NEGATIVE mg/dL
Hgb urine dipstick: NEGATIVE
Ketones, ur: NEGATIVE mg/dL
Leukocytes,Ua: NEGATIVE
Nitrite: NEGATIVE
Protein, ur: 30 mg/dL — AB
Specific Gravity, Urine: 1.025 (ref 1.005–1.030)
pH: 5 (ref 5.0–8.0)

## 2024-03-16 DIAGNOSIS — E785 Hyperlipidemia, unspecified: Secondary | ICD-10-CM | POA: Diagnosis not present

## 2024-03-16 DIAGNOSIS — I10 Essential (primary) hypertension: Secondary | ICD-10-CM | POA: Diagnosis not present

## 2024-04-14 ENCOUNTER — Other Ambulatory Visit (HOSPITAL_COMMUNITY)
Admission: RE | Admit: 2024-04-14 | Discharge: 2024-04-14 | Disposition: A | Discharge status: Home / Self Care | Source: Ambulatory Visit | Attending: Internal Medicine | Admitting: Internal Medicine

## 2024-04-14 DIAGNOSIS — E785 Hyperlipidemia, unspecified: Secondary | ICD-10-CM | POA: Insufficient documentation

## 2024-04-14 DIAGNOSIS — N1831 Chronic kidney disease, stage 3a: Secondary | ICD-10-CM | POA: Diagnosis not present

## 2024-04-14 DIAGNOSIS — N390 Urinary tract infection, site not specified: Secondary | ICD-10-CM | POA: Diagnosis not present

## 2024-04-14 DIAGNOSIS — Z0001 Encounter for general adult medical examination with abnormal findings: Secondary | ICD-10-CM | POA: Diagnosis not present

## 2024-04-14 DIAGNOSIS — Z1331 Encounter for screening for depression: Secondary | ICD-10-CM | POA: Diagnosis not present

## 2024-04-14 DIAGNOSIS — J42 Unspecified chronic bronchitis: Secondary | ICD-10-CM | POA: Diagnosis not present

## 2024-04-14 DIAGNOSIS — K219 Gastro-esophageal reflux disease without esophagitis: Secondary | ICD-10-CM | POA: Diagnosis not present

## 2024-04-14 DIAGNOSIS — K449 Diaphragmatic hernia without obstruction or gangrene: Secondary | ICD-10-CM | POA: Diagnosis not present

## 2024-04-14 DIAGNOSIS — I10 Essential (primary) hypertension: Secondary | ICD-10-CM | POA: Diagnosis not present

## 2024-04-14 DIAGNOSIS — I129 Hypertensive chronic kidney disease with stage 1 through stage 4 chronic kidney disease, or unspecified chronic kidney disease: Secondary | ICD-10-CM | POA: Diagnosis not present

## 2024-04-14 DIAGNOSIS — Z1389 Encounter for screening for other disorder: Secondary | ICD-10-CM | POA: Diagnosis not present

## 2024-04-14 DIAGNOSIS — F419 Anxiety disorder, unspecified: Secondary | ICD-10-CM | POA: Diagnosis not present

## 2024-04-14 LAB — CBC WITH DIFFERENTIAL/PLATELET
Abs Immature Granulocytes: 0.04 K/uL (ref 0.00–0.07)
Basophils Absolute: 0.1 K/uL (ref 0.0–0.1)
Basophils Relative: 1 %
Eosinophils Absolute: 0.2 K/uL (ref 0.0–0.5)
Eosinophils Relative: 2 %
HCT: 50.2 % — ABNORMAL HIGH (ref 36.0–46.0)
Hemoglobin: 15.1 g/dL — ABNORMAL HIGH (ref 12.0–15.0)
Immature Granulocytes: 0 %
Lymphocytes Relative: 19 %
Lymphs Abs: 2.2 K/uL (ref 0.7–4.0)
MCH: 21.2 pg — ABNORMAL LOW (ref 26.0–34.0)
MCHC: 30.1 g/dL (ref 30.0–36.0)
MCV: 70.5 fL — ABNORMAL LOW (ref 80.0–100.0)
Monocytes Absolute: 0.9 K/uL (ref 0.1–1.0)
Monocytes Relative: 7 %
Neutro Abs: 8.4 K/uL — ABNORMAL HIGH (ref 1.7–7.7)
Neutrophils Relative %: 71 %
Platelets: 430 K/uL — ABNORMAL HIGH (ref 150–400)
RBC: 7.12 MIL/uL — ABNORMAL HIGH (ref 3.87–5.11)
RDW: 19.8 % — ABNORMAL HIGH (ref 11.5–15.5)
WBC: 11.9 K/uL — ABNORMAL HIGH (ref 4.0–10.5)
nRBC: 0 % (ref 0.0–0.2)

## 2024-04-14 LAB — URINALYSIS, W/ REFLEX TO CULTURE (INFECTION SUSPECTED)
Bacteria, UA: NONE SEEN
Bilirubin Urine: NEGATIVE
Glucose, UA: NEGATIVE mg/dL
Hgb urine dipstick: NEGATIVE
Ketones, ur: NEGATIVE mg/dL
Nitrite: NEGATIVE
Protein, ur: NEGATIVE mg/dL
Specific Gravity, Urine: 1.026 (ref 1.005–1.030)
pH: 5 (ref 5.0–8.0)

## 2024-04-14 LAB — HEPATIC FUNCTION PANEL
ALT: 9 U/L (ref 0–44)
AST: 20 U/L (ref 15–41)
Albumin: 4.6 g/dL (ref 3.5–5.0)
Alkaline Phosphatase: 87 U/L (ref 38–126)
Bilirubin, Direct: 0.3 mg/dL — ABNORMAL HIGH (ref 0.0–0.2)
Indirect Bilirubin: 0.4 mg/dL (ref 0.3–0.9)
Total Bilirubin: 0.8 mg/dL (ref 0.0–1.2)
Total Protein: 7.6 g/dL (ref 6.5–8.1)

## 2024-04-14 LAB — BASIC METABOLIC PANEL WITH GFR
Anion gap: 15 (ref 5–15)
BUN: 30 mg/dL — ABNORMAL HIGH (ref 8–23)
CO2: 24 mmol/L (ref 22–32)
Calcium: 9.9 mg/dL (ref 8.9–10.3)
Chloride: 101 mmol/L (ref 98–111)
Creatinine, Ser: 1.07 mg/dL — ABNORMAL HIGH (ref 0.44–1.00)
GFR, Estimated: 52 mL/min — ABNORMAL LOW (ref >60)
Glucose, Bld: 103 mg/dL — ABNORMAL HIGH (ref 70–99)
Potassium: 3.8 mmol/L (ref 3.5–5.1)
Sodium: 140 mmol/L (ref 135–145)

## 2024-04-14 LAB — LIPID PANEL
Cholesterol: 147 mg/dL (ref 0–200)
HDL: 48 mg/dL (ref >40)
LDL Cholesterol: 73 mg/dL (ref 0–99)
Total CHOL/HDL Ratio: 3.1 ratio
Triglycerides: 132 mg/dL (ref <150)
VLDL: 26 mg/dL (ref 0–40)
